# Patient Record
Sex: Female | Born: 1995
Health system: Southern US, Community
[De-identification: ages and names within clinical notes are randomized; demographics above are authoritative.]

## PROBLEM LIST (undated history)

## (undated) ENCOUNTER — Inpatient Hospital Stay (HOSPITAL_COMMUNITY): Payer: Self-pay

## (undated) ENCOUNTER — Emergency Department (HOSPITAL_COMMUNITY): Discharge status: Absent without leave | Payer: 59

## (undated) DIAGNOSIS — T783XXA Angioneurotic edema, initial encounter: Secondary | ICD-10-CM

## (undated) DIAGNOSIS — R87629 Unspecified abnormal cytological findings in specimens from vagina: Secondary | ICD-10-CM

## (undated) DIAGNOSIS — L309 Dermatitis, unspecified: Secondary | ICD-10-CM

## (undated) DIAGNOSIS — A749 Chlamydial infection, unspecified: Secondary | ICD-10-CM

## (undated) DIAGNOSIS — F32A Depression, unspecified: Secondary | ICD-10-CM

## (undated) DIAGNOSIS — R569 Unspecified convulsions: Secondary | ICD-10-CM

## (undated) DIAGNOSIS — F329 Major depressive disorder, single episode, unspecified: Secondary | ICD-10-CM

## (undated) DIAGNOSIS — D649 Anemia, unspecified: Secondary | ICD-10-CM

## (undated) HISTORY — PX: NO PAST SURGERIES: SHX2092

## (undated) HISTORY — DX: Anemia, unspecified: D64.9

## (undated) HISTORY — DX: Angioneurotic edema, initial encounter: T78.3XXA

## (undated) HISTORY — DX: Dermatitis, unspecified: L30.9

---
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
## 2014-01-25 NOTE — L&D Delivery Note (Cosign Needed)
Patient is 19 y.o. G1P1001 [redacted]w[redacted]d admitted for IOL for nonreassuring fetal strip  Delivery Note At 11:17 PM a viable female was delivered via Vaginal, Spontaneous Delivery (Presentation: ; Occiput Anterior).  APGAR: 8, 9; weight pending.   Placenta status: Intact, Spontaneous.  Cord: 3 vessels with the following complications: None.    Anesthesia: Epidural  Episiotomy: None Lacerations: Labial, hemostatic Suture Repair: none Est. Blood Loss (mL): 53  Mom to postpartum.  Baby to Couplet care / Skin to Skin.  Durenda Hurt 10/09/2014, 12:06 AM   I was present for the entire delivery of baby and placenta and inspection of perineum and agree with above. Moderate MSF noted at delivery, but not observed before.   Placenta to: BS Feeding: Breast Circ: OP Contraception: POPs  [place], CNM 10/09/2014 12:30 AM

## 2014-04-29 ENCOUNTER — Other Ambulatory Visit (HOSPITAL_COMMUNITY): Payer: Self-pay

## 2014-04-29 ENCOUNTER — Other Ambulatory Visit (HOSPITAL_COMMUNITY): Payer: Self-pay | Admitting: Urology

## 2014-04-29 DIAGNOSIS — Z1389 Encounter for screening for other disorder: Secondary | ICD-10-CM

## 2014-04-29 LAB — OB RESULTS CONSOLE RPR: RPR: NONREACTIVE

## 2014-04-29 LAB — OB RESULTS CONSOLE HEPATITIS B SURFACE ANTIGEN: Hepatitis B Surface Ag: NEGATIVE

## 2014-04-29 LAB — OB RESULTS CONSOLE ABO/RH: RH TYPE: POSITIVE

## 2014-04-29 LAB — OB RESULTS CONSOLE GC/CHLAMYDIA
CHLAMYDIA, DNA PROBE: NEGATIVE
GC PROBE AMP, GENITAL: NEGATIVE

## 2014-04-29 LAB — OB RESULTS CONSOLE HIV ANTIBODY (ROUTINE TESTING): HIV: NONREACTIVE

## 2014-04-29 LAB — OB RESULTS CONSOLE RUBELLA ANTIBODY, IGM: Rubella: NON-IMMUNE/NOT IMMUNE

## 2014-05-21 ENCOUNTER — Ambulatory Visit (HOSPITAL_COMMUNITY)
Admission: RE | Admit: 2014-05-21 | Discharge: 2014-05-21 | Disposition: A | Discharge status: Home / Self Care | Payer: Medicaid Other | Source: Ambulatory Visit | Attending: Urology | Admitting: Urology

## 2014-05-21 ENCOUNTER — Encounter (HOSPITAL_COMMUNITY): Payer: Self-pay

## 2014-05-21 ENCOUNTER — Other Ambulatory Visit (HOSPITAL_COMMUNITY): Payer: Self-pay | Admitting: Urology

## 2014-05-21 DIAGNOSIS — Z36 Encounter for antenatal screening of mother: Secondary | ICD-10-CM | POA: Insufficient documentation

## 2014-05-21 DIAGNOSIS — Z1389 Encounter for screening for other disorder: Secondary | ICD-10-CM

## 2014-05-23 ENCOUNTER — Ambulatory Visit (HOSPITAL_COMMUNITY): Payer: Self-pay

## 2014-05-23 DIAGNOSIS — Z3A18 18 weeks gestation of pregnancy: Secondary | ICD-10-CM | POA: Insufficient documentation

## 2014-05-23 DIAGNOSIS — Z1389 Encounter for screening for other disorder: Secondary | ICD-10-CM | POA: Insufficient documentation

## 2014-05-23 DIAGNOSIS — O281 Abnormal biochemical finding on antenatal screening of mother: Secondary | ICD-10-CM | POA: Insufficient documentation

## 2014-05-27 ENCOUNTER — Other Ambulatory Visit (HOSPITAL_COMMUNITY): Payer: Self-pay

## 2014-05-28 ENCOUNTER — Ambulatory Visit (HOSPITAL_COMMUNITY): Payer: Self-pay

## 2014-07-19 ENCOUNTER — Encounter (HOSPITAL_COMMUNITY): Payer: Self-pay | Admitting: *Deleted

## 2014-07-19 ENCOUNTER — Inpatient Hospital Stay (HOSPITAL_COMMUNITY)
Admission: AD | Admit: 2014-07-19 | Discharge: 2014-07-19 | Disposition: A | Discharge status: Home / Self Care | Payer: 59 | Source: Ambulatory Visit | Attending: Obstetrics and Gynecology | Admitting: Obstetrics and Gynecology

## 2014-07-19 DIAGNOSIS — M545 Low back pain: Secondary | ICD-10-CM | POA: Diagnosis not present

## 2014-07-19 DIAGNOSIS — O26899 Other specified pregnancy related conditions, unspecified trimester: Secondary | ICD-10-CM

## 2014-07-19 DIAGNOSIS — R109 Unspecified abdominal pain: Secondary | ICD-10-CM | POA: Insufficient documentation

## 2014-07-19 DIAGNOSIS — O9989 Other specified diseases and conditions complicating pregnancy, childbirth and the puerperium: Secondary | ICD-10-CM | POA: Insufficient documentation

## 2014-07-19 DIAGNOSIS — Z3A26 26 weeks gestation of pregnancy: Secondary | ICD-10-CM | POA: Diagnosis not present

## 2014-07-19 HISTORY — DX: Unspecified abnormal cytological findings in specimens from vagina: R87.629

## 2014-07-19 LAB — URINALYSIS, ROUTINE W REFLEX MICROSCOPIC
BILIRUBIN URINE: NEGATIVE
Glucose, UA: NEGATIVE mg/dL
HGB URINE DIPSTICK: NEGATIVE
KETONES UR: NEGATIVE mg/dL
Leukocytes, UA: NEGATIVE
Nitrite: NEGATIVE
Protein, ur: NEGATIVE mg/dL
Specific Gravity, Urine: 1.01 (ref 1.005–1.030)
UROBILINOGEN UA: 1 mg/dL (ref 0.0–1.0)
pH: 6.5 (ref 5.0–8.0)
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
## 2014-07-19 NOTE — MAU Note (Signed)
Pt tearful, C/O lower abd & lower back pain & cramping x 2 days, is much worse today.  Had some spotting yesterday, none today.  Has noticed blood in her stool.

## 2014-07-19 NOTE — Discharge Instructions (Signed)
Back Pain in Pregnancy °Back pain during pregnancy is common. It happens in about half of all pregnancies. It is important for you and your baby that you remain active during your pregnancy. If you feel that back pain is not allowing you to remain active or sleep well, it is time to see your caregiver. Back pain may be caused by several factors related to changes during your pregnancy. Fortunately, unless you had trouble with your back before your pregnancy, the pain is likely to get better after you deliver. °Low back pain usually occurs between the fifth and seventh months of pregnancy. It can, however, happen in the first couple months. Factors that increase the risk of back problems include:  °· Previous back problems. °· Injury to your back. °· Having twins or multiple births. °· A chronic cough. °· Stress. °· Job-related repetitive motions. °· Muscle or spinal disease in the back. °· Family history of back problems, ruptured (herniated) discs, or osteoporosis. °· Depression, anxiety, and panic attacks. °CAUSES  °· When you are pregnant, your body produces a hormone called relaxin. This hormone makes the ligaments connecting the low back and pubic bones more flexible. This flexibility allows the baby to be delivered more easily. When your ligaments are loose, your muscles need to work harder to support your back. Soreness in your back can come from tired muscles. Soreness can also come from back tissues that are irritated since they are receiving less support. °· As the baby grows, it puts pressure on the nerves and blood vessels in your pelvis. This can cause back pain. °· As the baby grows and gets heavier during pregnancy, the uterus pushes the stomach muscles forward and changes your center of gravity. This makes your back muscles work harder to maintain good posture. °SYMPTOMS  °Lumbar pain during pregnancy °Lumbar pain during pregnancy usually occurs at or above the waist in the center of the back. There  may be pain and numbness that radiates into your leg or foot. This is similar to low back pain experienced by non-pregnant women. It usually increases with sitting for long periods of time, standing, or repetitive lifting. Tenderness may also be present in the muscles along your upper back. °Posterior pelvic pain during pregnancy °Pain in the back of the pelvis is more common than lumbar pain in pregnancy. It is a deep pain felt in your side at the waistline, or across the tailbone (sacrum), or in both places. You may have pain on one or both sides. This pain can also go into the buttocks and backs of the upper thighs. Pubic and groin pain may also be present. The pain does not quickly resolve with rest, and morning stiffness may also be present. °Pelvic pain during pregnancy can be brought on by most activities. A high level of fitness before and during pregnancy may or may not prevent this problem. Labor pain is usually 1 to 2 minutes apart, lasts for about 1 minute, and involves a bearing down feeling or pressure in your pelvis. However, if you are at term with the pregnancy, constant low back pain can be the beginning of early labor, and you should be aware of this. °DIAGNOSIS  °X-rays of the back should not be done during the first 12 to 14 weeks of the pregnancy and only when absolutely necessary during the rest of the pregnancy. MRIs do not give off radiation and are safe during pregnancy. MRIs also should only be done when absolutely necessary. °HOME CARE INSTRUCTIONS °· Exercise   as directed by your caregiver. Exercise is the most effective way to prevent or manage back pain. If you have a back problem, it is especially important to avoid sports that require sudden body movements. Swimming and walking are great activities. °· Do not stand in one place for long periods of time. °· Do not wear high heels. °· Sit in chairs with good posture. Use a pillow on your lower back if necessary. Make sure your head  rests over your shoulders and is not hanging forward. °· Try sleeping on your side, preferably the left side, with a pillow or two between your legs. If you are sore after a night's rest, your bed may be too soft. Try placing a board between your mattress and box spring. °· Listen to your body when lifting. If you are experiencing pain, ask for help or try bending your knees more so you can use your leg muscles rather than your back muscles. Squat down when picking up something from the floor. Do not bend over. °· Eat a healthy diet. Try to gain weight within your caregiver's recommendations. °· Use heat or cold packs 3 to 4 times a day for 15 minutes to help with the pain. °· Only take over-the-counter or prescription medicines for pain, discomfort, or fever as directed by your caregiver. °Sudden (acute) back pain °· Use bed rest for only the most extreme, acute episodes of back pain. Prolonged bed rest over 48 hours will aggravate your condition. °· Ice is very effective for acute conditions. °· Put ice in a plastic bag. °· Place a towel between your skin and the bag. °· Leave the ice on for 10 to 20 minutes every 2 hours, or as needed. °· Using heat packs for 30 minutes prior to activities is also helpful. °Continued back pain °See your caregiver if you have continued problems. Your caregiver can help or refer you for appropriate physical therapy. With conditioning, most back problems can be avoided. Sometimes, a more serious issue may be the cause of back pain. You should be seen right away if new problems seem to be developing. Your caregiver may recommend: °· A maternity girdle. °· An elastic sling. °· A back brace. °· A massage therapist or acupuncture. °SEEK MEDICAL CARE IF:  °· You are not able to do most of your daily activities, even when taking the pain medicine you were given. °· You need a referral to a physical therapist or chiropractor. °· You want to try acupuncture. °SEEK IMMEDIATE MEDICAL CARE  IF: °· You develop numbness, tingling, weakness, or problems with the use of your arms or legs. °· You develop severe back pain that is no longer relieved with medicines. °· You have a sudden change in bowel or bladder control. °· You have increasing pain in other areas of the body. °· You develop shortness of breath, dizziness, or fainting. °· You develop nausea, vomiting, or sweating. °· You have back pain which is similar to labor pains. °· You have back pain along with your water breaking or vaginal bleeding. °· You have back pain or numbness that travels down your leg. °· Your back pain developed after you fell. °· You develop pain on one side of your back. You may have a kidney stone. °· You see blood in your urine. You may have a bladder infection or kidney stone. °· You have back pain with blisters. You may have shingles. °Back pain is fairly common during pregnancy but should not be accepted as just part of   the process. Back pain should always be treated as soon as possible. This will make your pregnancy as pleasant as possible. °Document Released: 04/21/2005 Document Revised: 04/05/2011 Document Reviewed: 06/02/2010 °ExitCare® Patient Information ©2015 [employer]. This information is not intended to replace advice given to you by your health care provider. Make sure you discuss any questions you have with your health care provider. ° ° ° °Preterm Labor Information °Preterm labor is when labor starts before you are [redacted] weeks pregnant. The normal length of pregnancy is 39 to 41 weeks.  °CAUSES  °The cause of preterm labor is not often known. The most common known cause is infection. °RISK FACTORS °· Having a history of preterm labor. °· Having your water break before it should. °· Having a placenta that covers the opening of the cervix. °· Having a placenta that breaks away from the uterus. °· Having a cervix that is too weak to hold the baby in the uterus. °· Having too much fluid in the amniotic  sac. °· Taking drugs or smoking while pregnant. °· Not gaining enough weight while pregnant. °· Being younger than 18 and older than 21 years old. °· Having a low income. °· Being African American. °SYMPTOMS °· Period-like cramps, belly (abdominal) pain, or back pain. °· Contractions that are regular, as often as six in an hour. They may be mild or painful. °· Contractions that start at the top of the belly. They then move to the lower belly and back. °· Lower belly pressure that seems to get stronger. °· Bleeding from the vagina. °· Fluid leaking from the vagina. °TREATMENT  °Treatment depends on: °· Your condition. °· The condition of your baby. °· How many weeks pregnant you are. °Your doctor may have you: °· Take medicine to stop contractions. °· Stay in bed except to use the restroom (bed rest). °· Stay in the hospital. °WHAT SHOULD YOU DO IF YOU THINK YOU ARE IN PRETERM LABOR? °Call your doctor right away. You need to go to the hospital right away.  °HOW CAN YOU PREVENT PRETERM LABOR IN FUTURE PREGNANCIES? °· Stop smoking, if you smoke. °· Maintain healthy weight gain. °· Do not take drugs or be around chemicals that are not needed. °· Tell your doctor if you think you have an infection. °· Tell your doctor if you had a preterm labor before. °Document Released: 04/09/2008 Document Revised: 11/01/2012 Document Reviewed: 04/09/2008 °ExitCare® Patient Information ©2015 [employer]. This information is not intended to replace advice given to you by your health care provider. Make sure you discuss any questions you have with your health care provider. ° °
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
## 2014-09-23 LAB — OB RESULTS CONSOLE GBS: STREP GROUP B AG: NEGATIVE

## 2014-10-08 ENCOUNTER — Inpatient Hospital Stay (HOSPITAL_COMMUNITY): Payer: Medicaid Other | Admitting: Anesthesiology

## 2014-10-08 ENCOUNTER — Inpatient Hospital Stay (HOSPITAL_COMMUNITY)
Admission: AD | Admit: 2014-10-08 | Discharge: 2014-10-10 | DRG: 775 | Disposition: A | Discharge status: Home / Self Care | Payer: Medicaid Other | Source: Ambulatory Visit | Attending: Obstetrics & Gynecology | Admitting: Obstetrics & Gynecology

## 2014-10-08 ENCOUNTER — Encounter (HOSPITAL_COMMUNITY): Payer: Self-pay

## 2014-10-08 DIAGNOSIS — Z3A38 38 weeks gestation of pregnancy: Secondary | ICD-10-CM | POA: Diagnosis present

## 2014-10-08 DIAGNOSIS — Z9889 Other specified postprocedural states: Secondary | ICD-10-CM

## 2014-10-08 DIAGNOSIS — Z3490 Encounter for supervision of normal pregnancy, unspecified, unspecified trimester: Secondary | ICD-10-CM

## 2014-10-08 DIAGNOSIS — Z8249 Family history of ischemic heart disease and other diseases of the circulatory system: Secondary | ICD-10-CM

## 2014-10-08 LAB — CBC
HEMATOCRIT: 35.2 % — AB (ref 36.0–46.0)
HEMOGLOBIN: 12.5 g/dL (ref 12.0–15.0)
MCH: 32.3 pg (ref 26.0–34.0)
MCHC: 35.5 g/dL (ref 30.0–36.0)
MCV: 91 fL (ref 78.0–100.0)
Platelets: 190 10*3/uL (ref 150–400)
RBC: 3.87 MIL/uL (ref 3.87–5.11)
RDW: 13.5 % (ref 11.5–15.5)
WBC: 9.7 10*3/uL (ref 4.0–10.5)

## 2014-10-08 LAB — TYPE AND SCREEN
ABO/RH(D): O POS
ANTIBODY SCREEN: NEGATIVE

## 2014-10-08 LAB — ABO/RH: ABO/RH(D): O POS

## 2014-10-08 MED ORDER — LACTATED RINGERS IV SOLN
INTRAVENOUS | Status: DC
  Administered 2014-10-08 (×2): via INTRAVENOUS

## 2014-10-08 MED ORDER — OXYTOCIN BOLUS FROM INFUSION
500.0000 mL | INTRAVENOUS | Status: DC
Start: 2014-10-08 — End: 2014-10-09

## 2014-10-08 MED ORDER — FENTANYL 2.5 MCG/ML BUPIVACAINE 1/10 % EPIDURAL INFUSION (WH - ANES)
14.0000 mL/h | INTRAMUSCULAR | Status: DC | PRN
  Administered 2014-10-08 (×2): 14 mL/h via EPIDURAL
  Filled 2014-10-08: qty 125

## 2014-10-08 MED ORDER — DIPHENHYDRAMINE HCL 50 MG/ML IJ SOLN
12.5000 mg | INTRAMUSCULAR | Status: DC | PRN
Start: 2014-10-08 — End: 2014-10-09

## 2014-10-08 MED ORDER — FENTANYL CITRATE (PF) 100 MCG/2ML IJ SOLN
100.0000 ug | INTRAMUSCULAR | Status: DC | PRN
  Administered 2014-10-08: 100 ug via INTRAVENOUS
  Filled 2014-10-08: qty 2

## 2014-10-08 MED ORDER — LACTATED RINGERS IV BOLUS (SEPSIS): 1000.0000 mL | Freq: Once | INTRAVENOUS | Status: DC

## 2014-10-08 MED ORDER — CITRIC ACID-SODIUM CITRATE 334-500 MG/5ML PO SOLN
30.0000 mL | ORAL | Status: DC | PRN
Start: 2014-10-08 — End: 2014-10-09

## 2014-10-08 MED ORDER — ACETAMINOPHEN 325 MG PO TABS: 650.0000 mg | ORAL_TABLET | ORAL | Status: DC | PRN

## 2014-10-08 MED ORDER — LIDOCAINE HCL (PF) 1 % IJ SOLN
30.0000 mL | INTRAMUSCULAR | Status: DC | PRN
  Filled 2014-10-08: qty 30

## 2014-10-08 MED ORDER — EPHEDRINE 5 MG/ML INJ
10.0000 mg | INTRAVENOUS | Status: DC | PRN
  Filled 2014-10-08: qty 2

## 2014-10-08 MED ORDER — ONDANSETRON HCL 4 MG/2ML IJ SOLN
4.0000 mg | Freq: Four times a day (QID) | INTRAMUSCULAR | Status: DC | PRN
  Administered 2014-10-08: 4 mg via INTRAVENOUS
  Filled 2014-10-08: qty 2

## 2014-10-08 MED ORDER — PHENYLEPHRINE 40 MCG/ML (10ML) SYRINGE FOR IV PUSH (FOR BLOOD PRESSURE SUPPORT)
80.0000 ug | PREFILLED_SYRINGE | INTRAVENOUS | Status: DC | PRN
  Filled 2014-10-08: qty 20
  Filled 2014-10-08: qty 2

## 2014-10-08 MED ORDER — OXYTOCIN 40 UNITS IN LACTATED RINGERS INFUSION - SIMPLE MED
1.0000 m[IU]/min | INTRAVENOUS | Status: DC
  Administered 2014-10-08: 4 m[IU]/min via INTRAVENOUS
  Administered 2014-10-08: 2 m[IU]/min via INTRAVENOUS
  Administered 2014-10-08: 10 m[IU]/min via INTRAVENOUS
  Filled 2014-10-08: qty 1000

## 2014-10-08 MED ORDER — LACTATED RINGERS IV SOLN
500.0000 mL | INTRAVENOUS | Status: DC | PRN
  Administered 2014-10-08: 500 mL via INTRAVENOUS

## 2014-10-08 MED ORDER — OXYTOCIN 40 UNITS IN LACTATED RINGERS INFUSION - SIMPLE MED: 62.5000 mL/h | INTRAVENOUS | Status: DC

## 2014-10-08 MED ORDER — OXYCODONE-ACETAMINOPHEN 5-325 MG PO TABS: 2.0000 | ORAL_TABLET | ORAL | Status: DC | PRN

## 2014-10-08 MED ORDER — OXYCODONE-ACETAMINOPHEN 5-325 MG PO TABS
1.0000 | ORAL_TABLET | ORAL | Status: DC | PRN
Start: 2014-10-08 — End: 2014-10-09

## 2014-10-08 MED ORDER — LIDOCAINE HCL (PF) 1 % IJ SOLN
INTRAMUSCULAR | Status: DC | PRN
  Administered 2014-10-08 (×2): 8 mL via EPIDURAL

## 2014-10-08 MED ORDER — TERBUTALINE SULFATE 1 MG/ML IJ SOLN
0.2500 mg | Freq: Once | INTRAMUSCULAR | Status: DC | PRN
  Filled 2014-10-08: qty 1

## 2014-10-08 NOTE — Anesthesia Preprocedure Evaluation (Signed)
Anesthesia Evaluation  Patient identified by MRN, date of birth, ID band Patient awake    Reviewed: Allergy & Precautions, H&P , NPO status , Patient's Chart, lab work & pertinent test results  Airway Mallampati: I  TM Distance: >3 FB Neck ROM: full    Dental no notable dental hx.    Pulmonary neg pulmonary ROS,    Pulmonary exam normal breath sounds clear to auscultation       Cardiovascular negative cardio ROS Normal cardiovascular exam     Neuro/Psych negative neurological ROS  negative psych ROS   GI/Hepatic negative GI ROS, Neg liver ROS,   Endo/Other  negative endocrine ROS  Renal/GU negative Renal ROS     Musculoskeletal   Abdominal Normal abdominal exam  (+)   Peds  Hematology negative hematology ROS (+)   Anesthesia Other Findings   Reproductive/Obstetrics (+) Pregnancy                             Anesthesia Physical Anesthesia Plan  ASA: II  Anesthesia Plan: Epidural   Post-op Pain Management:    Induction:   Airway Management Planned:   Additional Equipment:   Intra-op Plan:   Post-operative Plan:   Informed Consent: I have reviewed the patients History and Physical, chart, labs and discussed the procedure including the risks, benefits and alternatives for the proposed anesthesia with the patient or authorized representative who has indicated his/her understanding and acceptance.     Plan Discussed with:   Anesthesia Plan Comments:         Anesthesia Quick Evaluation  

## 2014-10-08 NOTE — H&P (Signed)
LABOR ADMISSION HISTORY AND PHYSICAL  Angelicia Lessner is a 19 y.o. female G1P0 with IUP at [redacted]w[redacted]d by LMP presenting for IOL for non-reassuring fetal strip.  She reports sharp back and abdominal pains beginning around 10 hours ago. Denies contractions, LOF, VB.  She plans on breast and bottle feeding. She request POPs for birth control.  Dating: By LMP (01/13/14) --->  Estimated Date of Delivery: 10/20/14  Sono:    , CWD, normal anatomy, cephalic presentation, longitudinal lie, 269g, 59% EFW   Past Medical History: Past Medical History  Diagnosis Date  . Vaginal Pap smear, abnormal     Past Surgical History: History reviewed. No pertinent past surgical history.  Obstetrical History: OB History    Gravida Para Term Preterm AB TAB SAB Ectopic Multiple Living   1         0      Social History: Social History   Social History  . Marital Status: Single    Spouse Name: N/A  . Number of Children: N/A  . Years of Education: N/A   Social History Main Topics  . Smoking status: Never Smoker   . Smokeless tobacco: Never Used  . Alcohol Use: No  . Drug Use: Yes    Special: Marijuana     Comment: marijuana- stopped x 10 dys  . Sexual Activity: Yes   Other Topics Concern  . None   Social History Narrative    Family History: Family History  Problem Relation Age of Onset  . Hypertension Mother     Allergies: No Known Allergies  Prescriptions prior to admission  Medication Sig Dispense Refill Last Dose  . Prenatal Vit-Fe Fumarate-FA (PRENATAL MULTIVITAMIN) TABS tablet Take 1 tablet by mouth daily at 12 noon.   10/07/2014 at Unknown time     Review of Systems   All systems reviewed and negative except as stated in HPI  BP 135/86 mmHg  Pulse 88  Temp(Src) 98.6 F (37 C) (Axillary)  Resp 18  Ht  (1.702 m)  Wt 166 lb (75.297 kg)  BMI 25.99 kg/m2  SpO2 98%  LMP 01/13/2014 General appearance: alert, cooperative and no distress Lungs: non-labored  breathing   Cardiac: regular rate Abdomen: gravid, non-tender Extremities: no sign of DVT, edema Presentation: cephalic Fetal monitoringBaseline: 145 bpm, Variability: Good {> 6 bpm), Accelerations: Reactive and Decelerations: Early Uterine activityFrequency: Every 1-4 minutes, Duration: 30-60 seconds and Intensity: moderate Dilation: 2 Effacement (%): 50 Station: -3 Exam by:: Sarajane Marek, RNC   Prenatal labs: ABO, Rh:  O+ Antibody:  Neg Rubella:  Non-immune RPR:   Neg HBsAg:   Neg HIV:   Non-reactive GBS: Negative (08/29 0000)  1 hr Glucola 97 Genetic screening: borderline elevated MSAFP (2.29) Anatomy US: no abnormalities noted  Prenatal Transfer Tool  Maternal Diabetes: No Genetic Screening: Abnormal:  Results: Elevated AFP Maternal Ultrasounds/Referrals: Normal Fetal Ultrasounds or other Referrals:  None Maternal Substance Abuse:  No Significant Maternal Medications:  None Significant Maternal Lab Results: Lab values include: Group B Strep negative  No results found for this or any previous visit (from the past 24 hour(s)).  Patient Active Problem List   Diagnosis Date Noted  . [redacted] weeks gestation of pregnancy   . Encounter for routine screening for malformation using ultrasonics   . Abnormal biochemical finding on antenatal screening of mother     Assessment: Lisbeth Puller is a 19 y.o. G1P0 at [redacted]w[redacted]d here for IOL for non-reassuring fetal strip.   #Labor:induction of labor;  will place Foley bulb and use pitocin #Pain: epidural #FWB: Category 2 #ID:  GBS negative #MOF: breast and bottle #MOC:POPs (will consider Mirena) #Circ:  outpatient  Tarri Abernethy, MD PGY-1 [facility]   OB fellow attestation: I have seen and examined this patient; I agree with above documentation in the resident's note.  Nicloe Frontera is a 19 y.o. G1P0 here for IOL 2nd to non-reassuring fetal tracing the decelerations in the MAU at term.  PE: BP 132/84 mmHg   Pulse 87  Temp(Src) 98.6 F (37 C) (Axillary)  Resp 18  Ht 5\' 7"  (1.702 m)  Wt 166 lb (75.297 kg)  BMI 25.99 kg/m2  SpO2 98%  LMP 01/13/2014 Gen: calm comfortable, NAD Resp: normal effort, no distress Abd: gravid  ROS, labs, PMH reviewed  Plan: #Labor:induction of labor; will place Foley bulb and use pitocin #Pain: epidural #FWB: Category 2 #ID:  GBS negative #MOF: breast and bottle #MOC:POPs (will consider Mirena) #Circ:  Outpatient  Federico Flake, MD Family Medicine, OB Fellow 10/08/2014, 11:57 AM

## 2014-10-08 NOTE — MAU Note (Signed)
Pt c/o tightness in stomach that comes and goes snce 1100pm. Denies LOF or vag bleeding. +FM

## 2014-10-08 NOTE — Anesthesia Procedure Notes (Signed)
Epidural Patient location during procedure: OB Start time: 10/08/2014 8:15 PM End time: 10/08/2014 8:19 PM  Staffing Anesthesiologist: Leilani Able Performed by: anesthesiologist   Preanesthetic Checklist Completed: patient identified, surgical consent, pre-op evaluation, timeout performed, IV checked, risks and benefits discussed and monitors and equipment checked  Epidural Patient position: sitting Prep: site prepped and draped and DuraPrep Patient monitoring: continuous pulse ox and blood pressure Approach: midline Location: L3-L4 Injection technique: LOR air  Needle:  Needle type: Tuohy  Needle gauge: 17 G Needle length: 9 cm and 9 Needle insertion depth: 5 cm cm Catheter type: closed end flexible Catheter size: 19 Gauge Catheter at skin depth: 10 cm Test dose: negative and Other  Assessment Sensory level: T9 Events: blood not aspirated, injection not painful, no injection resistance, negative IV test and no paresthesia  Additional Notes Reason for block:procedure for pain

## 2014-10-08 NOTE — Progress Notes (Signed)
Crystal Walters is a 19 y.o. G1P0 at [redacted]w[redacted]d by LMP admitted for induction of labor due to non-reassuring fetal monitoring (deceleration).  Subjective: Contractions have stopped, much more comfortable  Objective: BP 131/83 mmHg  Pulse 85  Temp(Src) 98.6 F (37 C) (Axillary)  Resp 18  Ht  (1.702 m)  Wt 166 lb (75.297 kg)  BMI 25.99 kg/m2  SpO2 98%  LMP 01/13/2014      FHT:  FHR: 145 bpm, variability: moderate,  accelerations:  Present,  decelerations:  Absent UC:   none SVE:   Dilation: 3 Effacement (%): 70 Station: -2 Exam by:: Dr Alvester Morin  Labs: Lab Results  Component Value Date   WBC 9.7 10/08/2014   HGB 12.5 10/08/2014   HCT 35.2* 10/08/2014   MCV 91.0 10/08/2014   PLT 190 10/08/2014    Assessment / Plan: Induction of labor due to non-reassuring fetal testing,  progressing well on pitocin  Labor: will start pitocin now that bishop is 7 Fetal Wellbeing:  Category I Pain Control:  Fentanyl I/D:  GBS neg Anticipated MOD:  NSVD  Crystal Walters 10/08/2014, 2:00 PM

## 2014-10-09 ENCOUNTER — Encounter (HOSPITAL_COMMUNITY): Payer: Self-pay

## 2014-10-09 DIAGNOSIS — Z3A38 38 weeks gestation of pregnancy: Secondary | ICD-10-CM

## 2014-10-09 DIAGNOSIS — Z8249 Family history of ischemic heart disease and other diseases of the circulatory system: Secondary | ICD-10-CM

## 2014-10-09 LAB — HIV ANTIBODY (ROUTINE TESTING W REFLEX): HIV SCREEN 4TH GENERATION: NONREACTIVE

## 2014-10-09 LAB — SYPHILIS: RPR W/REFLEX TO RPR TITER AND TREPONEMAL ANTIBODIES, TRADITIONAL SCREENING AND DIAGNOSIS ALGORITHM: RPR Ser Ql: NONREACTIVE

## 2014-10-09 MED ORDER — OXYCODONE-ACETAMINOPHEN 5-325 MG PO TABS: 1.0000 | ORAL_TABLET | ORAL | Status: DC | PRN

## 2014-10-09 MED ORDER — ONDANSETRON HCL 4 MG PO TABS: 4.0000 mg | ORAL_TABLET | ORAL | Status: DC | PRN

## 2014-10-09 MED ORDER — DIBUCAINE 1 % RE OINT: 1.0000 "application " | TOPICAL_OINTMENT | RECTAL | Status: DC | PRN

## 2014-10-09 MED ORDER — BENZOCAINE-MENTHOL 20-0.5 % EX AERO
1.0000 "application " | INHALATION_SPRAY | CUTANEOUS | Status: DC | PRN
  Administered 2014-10-09: 1 via TOPICAL
  Filled 2014-10-09: qty 56

## 2014-10-09 MED ORDER — DIPHENHYDRAMINE HCL 25 MG PO CAPS: 25.0000 mg | ORAL_CAPSULE | Freq: Four times a day (QID) | ORAL | Status: DC | PRN

## 2014-10-09 MED ORDER — PRENATAL MULTIVITAMIN CH
1.0000 | ORAL_TABLET | Freq: Every day | ORAL | Status: DC
  Administered 2014-10-09 – 2014-10-10 (×2): 1 via ORAL
  Filled 2014-10-09 (×2): qty 1

## 2014-10-09 MED ORDER — ZOLPIDEM TARTRATE 5 MG PO TABS: 5.0000 mg | ORAL_TABLET | Freq: Every evening | ORAL | Status: DC | PRN

## 2014-10-09 MED ORDER — SIMETHICONE 80 MG PO CHEW: 80.0000 mg | CHEWABLE_TABLET | ORAL | Status: DC | PRN

## 2014-10-09 MED ORDER — SENNOSIDES-DOCUSATE SODIUM 8.6-50 MG PO TABS
2.0000 | ORAL_TABLET | ORAL | Status: DC
  Administered 2014-10-09: 2 via ORAL
  Filled 2014-10-09: qty 2

## 2014-10-09 MED ORDER — ONDANSETRON HCL 4 MG/2ML IJ SOLN: 4.0000 mg | INTRAMUSCULAR | Status: DC | PRN

## 2014-10-09 MED ORDER — IBUPROFEN 600 MG PO TABS
600.0000 mg | ORAL_TABLET | Freq: Four times a day (QID) | ORAL | Status: DC
  Administered 2014-10-09 – 2014-10-10 (×6): 600 mg via ORAL
  Filled 2014-10-09 (×6): qty 1

## 2014-10-09 MED ORDER — WITCH HAZEL-GLYCERIN EX PADS: 1.0000 "application " | MEDICATED_PAD | CUTANEOUS | Status: DC | PRN

## 2014-10-09 MED ORDER — LANOLIN HYDROUS EX OINT: TOPICAL_OINTMENT | CUTANEOUS | Status: DC | PRN

## 2014-10-09 MED ORDER — ACETAMINOPHEN 325 MG PO TABS: 650.0000 mg | ORAL_TABLET | ORAL | Status: DC | PRN

## 2014-10-09 NOTE — Clinical Social Work Maternal (Signed)
CLINICAL SOCIAL WORK MATERNAL/CHILD NOTE  Patient Details  Name: Crystal Walters MRN: [id number] Date of Birth: 09/20/1995  Date:  10/09/2014  Clinical Social Worker Initiating Note:  Sarah Venning LCSW and Sophiea Ueda BSW, MSW intern   Date/ Time Initiated:  10/09/14/ @ 2:30 pm   Child's Name:  Bryon   Legal Guardian:  Crystal Walters (MOB) and Crystal Walters (FOB)   Need for Interpreter:  None   Date of Referral:  10/08/14     Reason for Referral:  Current Substance Use/Substance Use During Pregnancy    Referral Source:  Central Nursery   Address:  [address] [place],  [address]  Phone number:  [phone]   Household Members:  Significant Other   Natural Supports (not living in the home):  Extended Family, Immediate Family, Spouse/significant other, Parent   Professional Supports: None   Employment:   None Reported  Type of Work:   NA  Education:      Financial Resources:  Medicaid   Other Resources:  WIC Cultural/Religious Considerations Which May Impact Care:  None Reported   Strengths:  Ability to meet basic needs , Home prepared for child    Risk Factors/Current Problems:  None   Cognitive State:  Alert , Goal Oriented , Insightful , Linear Thinking    Mood/Affect:  Bright , Comfortable , Relaxed , Happy , Interested    CSW Assessment:  CSW and MSW intern went in to patients room due to a consult being placed because of MOB's THC usage 10 days prior to delivery.  FOB and aunt of MOB were present in the room.MOB consented for them to stay in the room during the assessment. MOB expressed being excited and happy in becoming a mother and adjusting well to all the new changes in her life. MOB stated the birthing process went well and her experience at the hospital has been great so far. MOB voiced being "overly prepared" for the infant to come home and having all of infant's basic needs met. Per MOB, she has a strong family support sytem and resides  with FOB. MOB also stated her mother lives about five minutes away from them and will be available to help out with infant care.   CSW provided education on perinatal mood disorders. There is no prior history of mental health in MOB's chart. Per MOB, the pregnancy went well and she did not acknowledge any concerns in regard to perinatal mood disorders. MOB stated she just constantly felt hot during her pregnancy and during the last few weeks was just stressed due to being uncomfortable. MOB was appreciative of the education provided by CSW. MOB asked about pediatrician information and if CSW could provide her with a list. CSW made a copy for MOB and left it with her.   CSW inquired about THC history listed in MOB's H&P admission notes. MOB denied any substance use prior to or during the pregnancy. MOB voiced her concerns in that note being inaccurate and asked questions about who to contact in the hospital in order to have that removed from her records.  MOB voiced being aware of the hospital's drug screening policies and being aware that infant's UDS came out negative. MOB stated she was not concerned about the results and did not object to infant being drug screened, she simply wants to get her records straightened out. CSW informed MOB she would inquire about the hospital's record policies and procedures in regard to having information removed from a   patient's chart. MOB thanked CSW for her help and denied having any further questions. MOB agreed to contact CSW if needs arise.   CSW  Plan/Description:  Patient/Family Education: Perinatal mood disorders, hospital drug screen policy CSW to monitor infant's drug screens, and will notify CPS if there is a positive drug screen  No Further Intervention Required/No Barriers to Discharge    Jarrah Babich, Student-SW 10/09/2014, 3:40 PM 

## 2014-10-09 NOTE — Progress Notes (Signed)
Post Partum Day 1 Subjective:  Crystal Walters is a 19 y.o. G1P1001 [redacted]w[redacted]d s/p IVD for decels, moderate meconium in amniotic fluid.  No acute events overnight.  Pt denies problems with ambulating, voiding or po intake.  She denies nausea or vomiting.  Pain is well controlled.  She has not had flatus. She has not had bowel movement.  Lochia Small.  Plan for birth control is oral progesterone-only contraceptive.  Method of Feeding: Breast  Objective: Blood pressure 130/85, pulse 81, temperature 98 F (36.7 C), temperature source Oral, resp. rate 18, height  (1.702 m), weight 166 lb (75.297 kg), last menstrual period 01/13/2014, SpO2 100 %, unknown if currently breastfeeding.  Physical Exam:  General: alert, cooperative and no distress Lochia:normal flow Chest: CTAB Heart: RRR no m/r/g Abdomen: +BS, soft, nontender Uterine Fundus: firm, below level of umbilicus, minimal uterine tenderness DVT Evaluation: No evidence of DVT seen on physical exam. Extremities: no edema   Recent Labs  10/08/14 1020  HGB 12.5  HCT 35.2*    Assessment/Plan:  ASSESSMENT: Crystal Walters is a 19 y.o. G1P1001 [redacted]w[redacted]d s/p IVD for late decels found to have moderate meconium in amniotic fluid.  Plan for discharge tomorrow, Breastfeeding, Lactation consult and Contraception with POP   LOS: 1 day   Durenda Hurt 10/09/2014, 7:30 AM

## 2014-10-09 NOTE — Lactation Note (Signed)
This note was copied from the chart of Crystal Averey Koning. Lactation Consultation Note Initial visit at 20 hours of age.  Mom reports a few good feedings.  Baby is in crib showing feeding cues.  Mom declines holding baby STS for this feeding.  Assisted with cross cradle hold and pillow support.  Baby latches well with wide mouth and flanged lips.  Strong rhythmic sucking for several minutes observed and baby continues at the breast. [facility] resources given and discussed.  Encouraged to feed with early cues on demand.  Early newborn behavior discussed.  Hand expression demonstrated with colostrum visible.  Mom to call for assist as needed.  .   Patient Name: Crystal Walters Today's Date: 10/09/2014 Reason for consult: Initial assessment   Maternal Data Has patient been taught Hand Expression?: Yes Does the patient have breastfeeding experience prior to this delivery?: No  Feeding Feeding Type: Breast Fed Length of feed:  (observed several minutes)  LATCH Score/Interventions Latch: Grasps breast easily, tongue down, lips flanged, rhythmical sucking. Intervention(s): Adjust position;Assist with latch;Breast massage;Breast compression  Audible Swallowing: A few with stimulation Intervention(s): Skin to skin;Hand expression;Alternate breast massage  Type of Nipple: Everted at rest and after stimulation  Comfort (Breast/Nipple): Soft / non-tender     Hold (Positioning): Assistance needed to correctly position infant at breast and maintain latch. Intervention(s): Breastfeeding basics reviewed;Support Pillows;Position options;Skin to skin  LATCH Score: 8  Lactation Tools Discussed/Used     Consult Status Consult Status: Follow-up Date: 10/10/14 Follow-up type: In-patient    Shoptaw, Arvella Merles 10/09/2014, 8:11 PM

## 2014-10-09 NOTE — Anesthesia Postprocedure Evaluation (Signed)
  Anesthesia Post-op Note  Patient: Crystal Walters  Procedure(s) Performed: * No procedures listed *  Patient Location: Mother/Baby  Anesthesia Type:Epidural  Level of Consciousness: awake, alert , oriented and patient cooperative  Airway and Oxygen Therapy: Patient Spontanous Breathing  Post-op Pain: mild  Post-op Assessment: Post-op Vital signs reviewed, Patient's Cardiovascular Status Stable, Respiratory Function Stable, Patent Airway, No signs of Nausea or vomiting, Adequate PO intake, Pain level controlled, No headache, No backache and Patient able to bend at knees              Post-op Vital Signs: Reviewed and stable  Last Vitals:  Filed Vitals:   10/09/14 0634  BP: 130/85  Pulse: 81  Temp: 36.7 C  Resp: 18    Complications: No apparent anesthesia complications

## 2014-10-10 DIAGNOSIS — Z9889 Other specified postprocedural states: Secondary | ICD-10-CM

## 2014-10-10 MED ORDER — IBUPROFEN 600 MG PO TABS: 600.0000 mg | ORAL_TABLET | Freq: Four times a day (QID) | ORAL | Status: DC | PRN

## 2014-10-10 MED ORDER — NORETHINDRONE 0.35 MG PO TABS: 1.0000 | ORAL_TABLET | Freq: Every day | ORAL | Status: DC

## 2014-10-10 MED ORDER — MEASLES, MUMPS & RUBELLA VAC ~~LOC~~ INJ
0.5000 mL | INJECTION | Freq: Once | SUBCUTANEOUS | Status: AC
  Administered 2014-10-10: 0.5 mL via SUBCUTANEOUS
  Filled 2014-10-10: qty 0.5

## 2014-10-10 NOTE — Discharge Summary (Signed)
Obstetric Discharge Summary Reason for Admission: induction of labor for late decels in MAU Prenatal Procedures: ultrasound Intrapartum Procedures: spontaneous vaginal delivery Postpartum Procedures: none Complications-Operative and Postpartum: none  Delivery Note At 11:17 PM a viable female was delivered via Vaginal, Spontaneous Delivery (Presentation: ; Occiput Anterior).  APGAR: 8, 9; weight pending.   Placenta status: Intact, Spontaneous.  Cord: 3 vessels with the following complications: None.    Anesthesia: Epidural  Episiotomy: None Lacerations: Labial, hemostatic Suture Repair: none Est. Blood Loss (mL): 53  Mom to postpartum.  Baby to Couplet care / Skin to Skin.  Hospital Course:  Principal Problem:   Normal spontaneous vaginal delivery Active Problems:   Term pregnancy   Status post induction of labor   Crystal Walters is a 19 y.o. G1P1001 s/p SVD.  Patient was admitted for induction due to nonreassuring fetal heart tracing with repetitive lates. Did have moderate meconium seen only at time of delivery.  She has postpartum course that was uncomplicated including no problems with ambulating, PO intake, urination, pain, or bleeding. The pt feels ready to go home and  will be discharged with outpatient follow-up.   Today: No acute events overnight.  Pt denies problems with ambulating, voiding or po intake.  She denies nausea or vomiting.  Pain is well controlled.  She has had flatus. She has not had bowel movement.  Lochia Small.  Plan for birth control is  oral progesterone-only contraceptive.  Method of Feeding: Breast  Physical Exam:  General: alert, cooperative, appears stated age and no distress Lochia: appropriate Uterine Fundus: firm Incision: none DVT Evaluation: No evidence of DVT seen on physical exam. Negative Homan's sign.  H/H: Lab Results  Component Value Date/Time   HGB 12.5 10/08/2014 10:20 AM   HCT 35.2* 10/08/2014 10:20 AM    Discharge  Diagnoses: Term Pregnancy-delivered  Discharge Information: Date: 10/10/2014 Activity: pelvic rest Diet: routine  Medications: PNV and Ibuprofen Breast feeding:  Yes Condition: stable Instructions: refer to handout Discharge to: home      Medication List    TAKE these medications        ibuprofen 600 MG tablet  Commonly known as:  ADVIL,MOTRIN  Take 1 tablet (600 mg total) by mouth every 6 (six) hours as needed for moderate pain or cramping.     norethindrone 0.35 MG tablet  Commonly known as:  ORTHO MICRONOR  Take 1 tablet (0.35 mg total) by mouth daily. Start pill pack on 3rd Sunday after delivery.  Start taking on:  10/27/2014     prenatal multivitamin Tabs tablet  Take 1 tablet by mouth daily at 12 noon.           Follow-up Information    Follow up with [facility] In 6 weeks.   Specialty:  Obstetrics and Gynecology   Why:  postpartum visit   Contact information:   [address] [place] [address] [phone]      Durenda Hurt ,DO PGY2 Resident Physician 10/10/2014,9:49 AM  OB fellow attestation I have seen and examined this patient and agree with above documentation in the resident's note.   Crystal Walters is a 19 y.o. G1P1001 s/p NSVD.   Pain is well controlled.  Plan for birth control is oral contraceptives (estrogen/progesterone).  Method of Feeding: breast  PE:  BP 135/83 mmHg  Pulse 90  Temp(Src) 98.6 F (37 C) (Oral)  Resp 19  Ht 5\' 7"  (1.702 m)  Wt 166 lb (75.297 kg)  BMI 25.99 kg/m2  SpO2 99%  LMP 01/13/2014  Breastfeeding? Unknown Gen: well appearing, NAD Lung: normal WOB CV; reg rate Abd: NTND Fundus firm  Recent Labs  10/08/14 1020  HGB 12.5  HCT 35.2*   Plan: discharge today - postpartum care discussed - f/u clinic in 6 weeks for postpartum visit  Federico Flake, MD 9:59 AM

## 2014-10-10 NOTE — Progress Notes (Signed)
Mom, Dad and Baby found sleeping in bed together. Baby removed from on top of FOB chest and placed in crib.  Instructed parents on safe sleep.

## 2014-10-10 NOTE — Lactation Note (Signed)
This note was copied from the chart of Crystal Walters. Lactation Consultation Note  Patient Name: Crystal Walters [id number] Date: 10/10/2014 Reason for consult: Follow-up assessment  Baby is 1 hours old and has been Consistently feeding at the breast 10 -35 mins,  Baby already latched on the right breast in cross cradle position , LC added pillow support.  LC reviewed basics and assisted with re-latch on the left breast , noted a short anterior frenulum when  The baby opened wide . Didn't elect latch in cross cradle and LC had mom do cross cradle with breast  Compressions and per mom comfortable . Baby fed 15 mins, released and nipple appeared well rounded. LC instructed mom on the use hand pump and had mom use it #24 Flange a good fit. Sore nipple and engorgement prevention and tx reviewed , referring to the Baby and me booklet.  Also instructed mom on the use of shells for when the milk comes in to elongate the nipple areola complex.  Mother informed of post-discharge support and given phone number to the lactation department, including services  for phone call assistance; out-patient appointments; and breastfeeding support group. List of other breastfeeding resources  in the community given in the handout. Encouraged mother to call for problems or concerns related to breastfeeding.   Maternal Data Has patient been taught Hand Expression?: Yes  Feeding Feeding Type: Breast Fed Length of feed: 20 min (LC assisted , nipple well rounded when baby released )  LATCH Score/Interventions Latch: Grasps breast easily, tongue down, lips flanged, rhythmical sucking.  Audible Swallowing: Spontaneous and intermittent  Type of Nipple: Everted at rest and after stimulation  Comfort (Breast/Nipple): Soft / non-tender     Hold (Positioning): Assistance needed to correctly position infant at breast and maintain latch.  LATCH Score: 9  Lactation Tools Discussed/Used Tools:  Pump Breast pump type: Manual   Consult Status Consult Status: Complete Date: 10/10/14    Kathrin Greathouse 10/10/2014, 10:52 AM

## 2014-11-25 ENCOUNTER — Ambulatory Visit: Payer: 59 | Admitting: Obstetrics and Gynecology

## 2015-09-11 ENCOUNTER — Emergency Department (HOSPITAL_COMMUNITY)
Admission: EM | Admit: 2015-09-11 | Discharge: 2015-09-11 | Discharge status: Against Medical Advice | Payer: 59 | Attending: Emergency Medicine | Admitting: Emergency Medicine

## 2015-09-11 ENCOUNTER — Encounter (HOSPITAL_COMMUNITY): Payer: Self-pay

## 2015-09-11 DIAGNOSIS — R112 Nausea with vomiting, unspecified: Secondary | ICD-10-CM | POA: Insufficient documentation

## 2015-09-11 DIAGNOSIS — R102 Pelvic and perineal pain: Secondary | ICD-10-CM | POA: Insufficient documentation

## 2015-09-11 MED ORDER — ONDANSETRON HCL 4 MG/2ML IJ SOLN
4.0000 mg | Freq: Once | INTRAMUSCULAR | Status: AC
  Administered 2015-09-11: 4 mg via INTRAVENOUS
  Filled 2015-09-11: qty 2

## 2015-09-11 MED ORDER — SODIUM CHLORIDE 0.9 % IV BOLUS (SEPSIS)
1000.0000 mL | Freq: Once | INTRAVENOUS | Status: AC
  Administered 2015-09-11: 1000 mL via INTRAVENOUS

## 2015-09-11 NOTE — ED Notes (Signed)
Bed: WA10 Expected date:  Expected time:  Means of arrival:  Comments: EMS-N/V 

## 2015-09-11 NOTE — ED Provider Notes (Signed)
[facility]-EMERGENCY DEPT Provider Note   CSN: [id number] Arrival date & time: 09/11/15  1511     History   Chief Complaint Chief Complaint  Patient presents with  . Emesis  . Nausea    HPI Crystal Walters is a 20 y.o. female.  HPI Crystal Walters is a 20 y.o. female with no medical problems, presents to ED with complaint of nausea and vomiting. Pt states she went to health dept this morning "to get checked out." States she did have some abdominal cramping at that time, but states that feels like her normal period cramps, states she is about to start her period. Patient states she was asymptomatic for any sexual chest disease, denies any vaginal discharge, pain with intercourse, abdominal pain. She states that while there she was told that her cervix was inflamed. She was given medications and was given prescription for Flagyl. She states she took 2 out of 4 tablets right after leaving and states shortly after that began throwing up. She states vomiting did not improve after several minutes so she took him as to the hospital. Patient states that she "feel crazy." She reports persistent nausea and vomiting. Reports lower abdominal cramping. She states that they did not do urinalysis and pregnancy test at the health department. Denies any fever or chills. No changes in her bowels. States she has not had anything to eat today and feels like the medications made her sick because it was on empty stomach.   Past Medical History:  Diagnosis Date  . Vaginal Pap smear, abnormal     Patient Active Problem List   Diagnosis Date Noted  . Normal spontaneous vaginal delivery 10/10/2014  . Status post induction of labor 10/10/2014  . Term pregnancy 10/08/2014  . [redacted] weeks gestation of pregnancy   . Encounter for routine screening for malformation using ultrasonics   . Abnormal biochemical finding on antenatal screening of mother     History reviewed. No pertinent surgical history.  OB History    Gravida Para Term Preterm AB Living   1 1 1     1    SAB TAB Ectopic Multiple Live Births         0 1       Home Medications    Prior to Admission medications   Medication Sig Start Date End Date Taking? Authorizing Provider  metroNIDAZOLE (FLAGYL) 500 MG tablet Take 500 mg by mouth 2 (two) times daily.   Yes Historical Provider, MD  ibuprofen (ADVIL,MOTRIN) 600 MG tablet Take 1 tablet (600 mg total) by mouth every 6 (six) hours as needed for moderate pain or cramping. Patient not taking: Reported on 09/11/2015 10/10/14   Adam PhenixJames G Arnold, MD  norethindrone (ORTHO MICRONOR) 0.35 MG tablet Take 1 tablet (0.35 mg total) by mouth daily. Start pill pack on 3rd Sunday after delivery. Patient not taking: Reported on 09/11/2015 10/27/14   Adam PhenixJames G Arnold, MD  Prenatal Vit-Fe Fumarate-FA (PRENATAL MULTIVITAMIN) TABS tablet Take 1 tablet by mouth daily at 12 noon.    Historical Provider, MD    Family History Family History  Problem Relation Age of Onset  . Hypertension Mother     Social History Social History  Substance Use Topics  . Smoking status: Never Smoker  . Smokeless tobacco: Never Used  . Alcohol use No     Allergies   Review of patient's allergies indicates no known allergies.   Review of Systems Review of Systems  Constitutional: Negative for chills and fever.  Respiratory: Negative for cough, chest tightness and shortness of breath.   Cardiovascular: Negative for chest pain, palpitations and leg swelling.  Gastrointestinal: Positive for abdominal pain, nausea and vomiting. Negative for diarrhea.  Genitourinary: Positive for pelvic pain. Negative for dysuria, flank pain, vaginal bleeding, vaginal discharge and vaginal pain.  Musculoskeletal: Negative for arthralgias, myalgias, neck pain and neck stiffness.  Skin: Negative for rash.  Neurological: Negative for dizziness, weakness and headaches.  All other systems reviewed and are negative.    Physical Exam Updated Vital  Signs BP 127/67 (BP Location: Right Arm)   Pulse (!) 56   Temp 97.5 F (36.4 C) (Oral)   Resp 18   Ht 5\' 7"  (1.702 m)   Wt 54.4 kg   LMP 08/23/2015 (Approximate)   SpO2 100%   BMI 18.79 kg/m   Physical Exam  Constitutional: She appears well-developed and well-nourished. No distress.  HENT:  Head: Normocephalic.  Eyes: Conjunctivae are normal.  Neck: Neck supple.  Cardiovascular: Normal rate, regular rhythm and normal heart sounds.   Pulmonary/Chest: Effort normal and breath sounds normal. No respiratory distress. She has no wheezes. She has no rales.  Abdominal: Soft. Bowel sounds are normal. She exhibits no distension. There is tenderness. There is no rebound.  Suprapubic tenderness with no guarding or rebound tenderness  Musculoskeletal: She exhibits no edema.  Neurological: She is alert.  Skin: Skin is warm and dry.  Psychiatric: She has a normal mood and affect. Her behavior is normal.  Nursing note and vitals reviewed.    ED Treatments / Results  Labs (all labs ordered are listed, but only abnormal results are displayed) Labs Reviewed  URINALYSIS, ROUTINE W REFLEX MICROSCOPIC (NOT AT [facility])  POC URINE PREG, ED    EKG  EKG Interpretation None       Radiology No results found.  Procedures Procedures (including critical care time)  Medications Ordered in ED Medications  sodium chloride 0.9 % bolus 1,000 mL (not administered)  ondansetron (ZOFRAN) injection 4 mg (not administered)     Initial Impression / Assessment and Plan / ED Course  I have reviewed the triage vital signs and the nursing notes.  Pertinent labs & imaging results that were available during my care of the patient were reviewed by me and considered in my medical decision making (see chart for details).  Clinical Course  pt in ED after becoming nauseated after being trated for STI. She received shot of rocephin, zithromax and took flagyl. Denies alcohol. Will give zofran, check UA and  preg test. States they did not check it at the clinic.   Delay in obtaining urinalysis, patient unable to go. She was given fluid bolus, still refusing. Ins and outs ordered, however patient is refusing and out catheter. Explained to her that as well as waiting on. Patient attempted to use at that time but instead had a bowel movement into the bedpen. We will check blood hCG.  Patient again was not able to give urine sample. She apparently walked out AGAINST MEDICAL ADVICE. She did not have anymore episodes of emesis while in ED. Most of time sleeping with her head under the sheet. Family with her.  Final Clinical Impressions(s) / ED Diagnoses   Final diagnoses:  Non-intractable vomiting with nausea, vomiting of unspecified type    New Prescriptions New Prescriptions   No medications on file     Jaynie Crumbleatyana Kailen Name, PA-C 09/12/15 0120    Raeford RazorStephen Kohut, MD 09/14/15 [phone]

## 2015-09-11 NOTE — ED Notes (Signed)
PA at bedside.

## 2015-09-11 NOTE — ED Triage Notes (Signed)
Pt presents via EMS with c/o nausea and vomiting. Pt went to the health department today and was diagnosed with PID. Pt was supposed to take one flagyl today and took 2 instead. Pt then vomited several times after taking the pills.

## 2015-09-11 NOTE — Progress Notes (Signed)
Patient listed as having UHC and Medicaid insurance.  Discussed status of insurance with registration.  They report [facility], and Medicaid was unable to process.  Patient reports she has Medicaid without a pcp.  [facility] [place] [facility] with [place] providers [place].  EDCM also provided patient [place] contact [place] and [place].  No further EDCM needs at this time.

## 2015-09-11 NOTE — ED Notes (Signed)
Unsuccessful IV attempt x 2.  Charge Dance movement psychotherapistN and Primary RN made aware.

## 2015-09-11 NOTE — ED Notes (Signed)
Pt refuses in and out cath  

## 2015-09-11 NOTE — ED Notes (Signed)
After three more cups of water and sitting on the bedpan for 40 minutes, pt sts she is still unable to go and will leave AMA.

## 2015-09-11 NOTE — ED Notes (Signed)
Pt has been aware that we need urine sample for hours. Pt placed on bed pan before 7pm because she refused to walk to the bathroom. Pt had bowel movement in urine sample, unable to send sample for testing. PA made aware. PA ordered I stat HCG, this RN attempted to draw blood off IV. No blood return noted. Pt c/o pain at IV site and demanded IV be removed. No swelling or redness noted to site. IV removed per request. Pt informed we still need blood sample. Pt refusing to be stuck with a needle. Pt sts "Can't I just stay until I can pee again?" This RN informed her that per PA we needed a pregnancy test stat, whether it be blood or urine or that she needed to leave AMA. Pt requested bed pan again. Pt currently on bedpan attempting to give another sample.

## 2015-09-11 NOTE — ED Notes (Addendum)
Pt. Sending family member out to tell staff she needs to urinate. When staff goes in to help patient, patient states, " I didn't call for you. Go get me a blanket." Patient has said the same saying numerous of times. Pt is also refusing the In and Out Cath. Nurse aware of patient behavior.

## 2015-09-11 NOTE — ED Notes (Signed)
Pt's nausea decreased. Asked PA for food. PA gave pt a sandwich, crackers, and drink

## 2015-09-25 ENCOUNTER — Other Ambulatory Visit (HOSPITAL_COMMUNITY)
Admission: RE | Admit: 2015-09-25 | Discharge: 2015-09-25 | Disposition: A | Discharge status: Home / Self Care | Payer: BLUE CROSS/BLUE SHIELD | Source: Ambulatory Visit | Attending: Nurse Practitioner | Admitting: Nurse Practitioner

## 2015-09-25 ENCOUNTER — Other Ambulatory Visit: Payer: Self-pay | Admitting: Nurse Practitioner

## 2015-09-25 DIAGNOSIS — Z01411 Encounter for gynecological examination (general) (routine) with abnormal findings: Secondary | ICD-10-CM | POA: Insufficient documentation

## 2015-09-25 DIAGNOSIS — Z113 Encounter for screening for infections with a predominantly sexual mode of transmission: Secondary | ICD-10-CM | POA: Diagnosis present

## 2015-09-26 LAB — CYTOLOGY - PAP

## 2015-12-17 DIAGNOSIS — Z309 Encounter for contraceptive management, unspecified: Secondary | ICD-10-CM | POA: Diagnosis not present

## 2016-01-20 DIAGNOSIS — J069 Acute upper respiratory infection, unspecified: Secondary | ICD-10-CM | POA: Diagnosis not present

## 2016-01-28 DIAGNOSIS — R062 Wheezing: Secondary | ICD-10-CM | POA: Diagnosis not present

## 2016-01-28 DIAGNOSIS — R05 Cough: Secondary | ICD-10-CM | POA: Diagnosis not present

## 2016-01-28 DIAGNOSIS — L309 Dermatitis, unspecified: Secondary | ICD-10-CM | POA: Diagnosis not present

## 2016-03-24 DIAGNOSIS — Z3202 Encounter for pregnancy test, result negative: Secondary | ICD-10-CM | POA: Diagnosis not present

## 2016-03-30 DIAGNOSIS — Z3202 Encounter for pregnancy test, result negative: Secondary | ICD-10-CM | POA: Diagnosis not present

## 2016-03-30 DIAGNOSIS — R110 Nausea: Secondary | ICD-10-CM | POA: Diagnosis not present

## 2016-03-30 DIAGNOSIS — N898 Other specified noninflammatory disorders of vagina: Secondary | ICD-10-CM | POA: Diagnosis not present

## 2016-03-30 DIAGNOSIS — N926 Irregular menstruation, unspecified: Secondary | ICD-10-CM | POA: Diagnosis not present

## 2016-04-08 DIAGNOSIS — N898 Other specified noninflammatory disorders of vagina: Secondary | ICD-10-CM | POA: Diagnosis not present

## 2016-04-09 DIAGNOSIS — Z113 Encounter for screening for infections with a predominantly sexual mode of transmission: Secondary | ICD-10-CM | POA: Diagnosis not present

## 2016-04-20 DIAGNOSIS — A749 Chlamydial infection, unspecified: Secondary | ICD-10-CM | POA: Diagnosis not present

## 2016-04-20 DIAGNOSIS — Z113 Encounter for screening for infections with a predominantly sexual mode of transmission: Secondary | ICD-10-CM | POA: Diagnosis not present

## 2016-05-12 DIAGNOSIS — J029 Acute pharyngitis, unspecified: Secondary | ICD-10-CM | POA: Diagnosis not present

## 2016-05-18 DIAGNOSIS — Z113 Encounter for screening for infections with a predominantly sexual mode of transmission: Secondary | ICD-10-CM | POA: Diagnosis not present

## 2016-05-18 DIAGNOSIS — K1370 Unspecified lesions of oral mucosa: Secondary | ICD-10-CM | POA: Diagnosis not present

## 2017-08-26 ENCOUNTER — Emergency Department (HOSPITAL_COMMUNITY)
Admission: EM | Admit: 2017-08-26 | Discharge: 2017-08-26 | Disposition: A | Discharge status: Home / Self Care | Payer: BLUE CROSS/BLUE SHIELD | Attending: Emergency Medicine | Admitting: Emergency Medicine

## 2017-08-26 ENCOUNTER — Emergency Department (HOSPITAL_COMMUNITY): Payer: BLUE CROSS/BLUE SHIELD

## 2017-08-26 ENCOUNTER — Encounter (HOSPITAL_COMMUNITY): Payer: Self-pay | Admitting: *Deleted

## 2017-08-26 DIAGNOSIS — K625 Hemorrhage of anus and rectum: Secondary | ICD-10-CM | POA: Diagnosis not present

## 2017-08-26 DIAGNOSIS — Z79899 Other long term (current) drug therapy: Secondary | ICD-10-CM | POA: Diagnosis not present

## 2017-08-26 DIAGNOSIS — R7401 Elevation of levels of liver transaminase levels: Secondary | ICD-10-CM

## 2017-08-26 DIAGNOSIS — R1030 Lower abdominal pain, unspecified: Secondary | ICD-10-CM

## 2017-08-26 DIAGNOSIS — N83201 Unspecified ovarian cyst, right side: Secondary | ICD-10-CM

## 2017-08-26 DIAGNOSIS — R1031 Right lower quadrant pain: Secondary | ICD-10-CM | POA: Diagnosis not present

## 2017-08-26 DIAGNOSIS — N839 Noninflammatory disorder of ovary, fallopian tube and broad ligament, unspecified: Secondary | ICD-10-CM | POA: Diagnosis not present

## 2017-08-26 DIAGNOSIS — R109 Unspecified abdominal pain: Secondary | ICD-10-CM | POA: Diagnosis not present

## 2017-08-26 DIAGNOSIS — R740 Nonspecific elevation of levels of transaminase and lactic acid dehydrogenase [LDH]: Secondary | ICD-10-CM | POA: Diagnosis not present

## 2017-08-26 LAB — URINALYSIS, ROUTINE W REFLEX MICROSCOPIC
Bilirubin Urine: NEGATIVE
Glucose, UA: NEGATIVE mg/dL
HGB URINE DIPSTICK: NEGATIVE
Ketones, ur: 5 mg/dL — AB
Leukocytes, UA: NEGATIVE
Nitrite: NEGATIVE
Protein, ur: NEGATIVE mg/dL
pH: 6 (ref 5.0–8.0)

## 2017-08-26 LAB — COMPREHENSIVE METABOLIC PANEL
ALT: 70 U/L — AB (ref 0–44)
AST: 421 U/L — AB (ref 15–41)
Albumin: 3.8 g/dL (ref 3.5–5.0)
Alkaline Phosphatase: 63 U/L (ref 38–126)
Anion gap: 12 (ref 5–15)
BUN: 7 mg/dL (ref 6–20)
CO2: 27 mmol/L (ref 22–32)
Calcium: 8.8 mg/dL — ABNORMAL LOW (ref 8.9–10.3)
Chloride: 102 mmol/L (ref 98–111)
Creatinine, Ser: 0.92 mg/dL (ref 0.44–1.00)
GFR calc Af Amer: >60 mL/min (ref >60)
GFR calc non Af Amer: >60 mL/min (ref >60)
GLUCOSE: 117 mg/dL — AB (ref 70–99)
Potassium: 3.2 mmol/L — ABNORMAL LOW (ref 3.5–5.1)
SODIUM: 141 mmol/L (ref 135–145)
Total Bilirubin: 0.7 mg/dL (ref 0.3–1.2)
Total Protein: 6.8 g/dL (ref 6.5–8.1)

## 2017-08-26 LAB — WET PREP, GENITAL
Sperm: NONE SEEN
Trich, Wet Prep: NONE SEEN
Yeast Wet Prep HPF POC: NONE SEEN

## 2017-08-26 LAB — CBC
HCT: 37.8 % (ref 36.0–46.0)
HEMOGLOBIN: 12 g/dL (ref 12.0–15.0)
MCH: 30.5 pg (ref 26.0–34.0)
MCHC: 31.7 g/dL (ref 30.0–36.0)
MCV: 96.2 fL (ref 78.0–100.0)
Platelets: 225 10*3/uL (ref 150–400)
RBC: 3.93 MIL/uL (ref 3.87–5.11)
RDW: 12.4 % (ref 11.5–15.5)
WBC: 6.3 10*3/uL (ref 4.0–10.5)

## 2017-08-26 LAB — POC OCCULT BLOOD, ED: Fecal Occult Bld: POSITIVE — AB

## 2017-08-26 LAB — LIPASE, BLOOD: Lipase: 31 U/L (ref 11–51)

## 2017-08-26 LAB — I-STAT BETA HCG BLOOD, ED (MC, WL, AP ONLY)

## 2017-08-26 MED ORDER — ONDANSETRON HCL 4 MG/2ML IJ SOLN
4.0000 mg | Freq: Once | INTRAMUSCULAR | Status: AC
  Administered 2017-08-26: 4 mg via INTRAVENOUS
  Filled 2017-08-26: qty 2

## 2017-08-26 MED ORDER — ONDANSETRON HCL 4 MG PO TABS: 4.0000 mg | ORAL_TABLET | Freq: Four times a day (QID) | ORAL | 0 refills | Status: DC

## 2017-08-26 MED ORDER — IOHEXOL 300 MG/ML  SOLN
100.0000 mL | Freq: Once | INTRAMUSCULAR | Status: AC | PRN
  Administered 2017-08-26: 100 mL via INTRAVENOUS

## 2017-08-26 MED ORDER — SODIUM CHLORIDE 0.9 % IV BOLUS
1000.0000 mL | Freq: Once | INTRAVENOUS | Status: AC
  Administered 2017-08-26: 1000 mL via INTRAVENOUS

## 2017-08-26 MED ORDER — MORPHINE SULFATE (PF) 4 MG/ML IV SOLN
4.0000 mg | Freq: Once | INTRAVENOUS | Status: AC
  Administered 2017-08-26: 4 mg via INTRAVENOUS
  Filled 2017-08-26: qty 1

## 2017-08-26 MED ORDER — METRONIDAZOLE 500 MG PO TABS: 500.0000 mg | ORAL_TABLET | Freq: Two times a day (BID) | ORAL | 0 refills | Status: DC

## 2017-08-26 NOTE — ED Notes (Signed)
Pt denies any further nausea or  Pain

## 2017-08-26 NOTE — Discharge Instructions (Signed)
As we discussed, today your ultrasound showed an ovarian cyst.  It is recommended to have repeat ultrasound in 4 to 6 weeks.  He can follow-up with OB/GYN to establish this.  As we discussed, your liver enzymes are slightly elevated today.  Please limit the amount of alcohol and Tylenol that you consume.  Additionally, he did have some blood noted on your rectal exam.  Your blood levels were stable.  These are things that you need to follow-up with GI about.  Please call their office and arrange for an appointment.  Tell them you are seen in the ED. You will need to have your liver functions repeated.   Your wet prep did come back concerning for BV. Take Flagyl as directed.  It is very important that you do not consume any alcohol while taking this medication as it will cause you to become violently ill.  Sure you are staying hydrated drink plenty of fluids.  Return the emergency department for any fever, worsening abdominal pain, black tarry stools, worsening rectal bleeding, dizziness, lightheadedness, passing out, chest pain, difficulty breathing or any other worsening or concerning symptoms.

## 2017-08-26 NOTE — ED Provider Notes (Signed)
[facility] EMER[facility]ENCY DEPARTMENT Provider Note   CSN: [id number] Arrival date & time: 08/26/17  0749     History   Chief Complaint Chief Complaint  Patient presents with  . Abdominal Pain    HPI Crystal Walters is a 22 y.o. female who presents for evaluation of abdominal pain that began yesterday and 3 days of bright red blood per rectum.  Patient reports that she started noticing some bright red blood with her bowel movements approximately 3 days ago.  She denies any black or tarry stools.  Patient also reports she is having some lower abdominal pain that she describes as "a sharp cramp."  It is worse on the left side.  Patient reports that last night, she started having nausea, vomiting.  Emesis is nonbloody, nonbilious.  Patient states that when she was vomiting, she felt warm but did not measure a temperature.  Patient denies any chest pain, difficulty breathing, urinary complaints, vaginal bleeding.  The history is provided by the patient.    Past Medical History:  Diagnosis Date  . Vaginal Pap smear, abnormal     Patient Active Problem List   Diagnosis Date Noted  . Normal spontaneous vaginal delivery 10/10/2014  . Status post induction of labor 10/10/2014  . Term pregnancy 10/08/2014  . [redacted] weeks gestation of pregnancy   . Encounter for routine screening for malformation using ultrasonics   . Abnormal biochemical finding on antenatal screening of mother     History reviewed. No pertinent surgical history.   OB History    Gravida  1   Para  1   Term  1   Preterm      AB      Living  1     SAB      TAB      Ectopic      Multiple  0   Live Births  1            Home Medications    Prior to Admission medications   Medication Sig Start Date End Date Taking? Authorizing Provider  ibuprofen (ADVIL,MOTRIN) 600 MG tablet Take 1 tablet (600 mg total) by mouth every 6 (six) hours as needed for moderate pain or cramping. Patient not  taking: Reported on 09/11/2015 10/10/14   Woodroe Mode, MD  metroNIDAZOLE (FLAGYL) 500 MG tablet Take 1 tablet (500 mg total) by mouth 2 (two) times daily. 08/26/17   Volanda Napoleon, PA-C  norethindrone (ORTHO MICRONOR) 0.35 MG tablet Take 1 tablet (0.35 mg total) by mouth daily. Start pill pack on 3rd Sunday after delivery. Patient not taking: Reported on 09/11/2015 10/27/14   Woodroe Mode, MD  ondansetron (ZOFRAN) 4 MG tablet Take 1 tablet (4 mg total) by mouth every 6 (six) hours. 08/26/17   Volanda Napoleon, PA-C  Prenatal Vit-Fe Fumarate-FA (PRENATAL MULTIVITAMIN) TABS tablet Take 1 tablet by mouth daily at 12 noon.    [provider]    Family History Family History  Problem Relation Age of Onset  . Hypertension Mother     Social History Social History   Tobacco Use  . Smoking status: Never Smoker  . Smokeless tobacco: Never Used  Substance Use Topics  . Alcohol use: No  . Drug use: Yes    Types: Marijuana    Comment: marijuana- stopped x 10 dys     Allergies   Patient has no known allergies.   Review of Systems Review of Systems  Constitutional:  Negative for chills and fever.  HENT: Negative for congestion.   Eyes: Negative for visual disturbance.  Respiratory: Negative for cough and shortness of breath.   Cardiovascular: Negative for chest pain.  Gastrointestinal: Positive for abdominal pain, blood in stool, nausea and vomiting. Negative for diarrhea.  Genitourinary: Negative for dysuria, hematuria and vaginal bleeding.  Musculoskeletal: Negative for back pain and neck pain.  Skin: Negative for rash.  Neurological: Negative for dizziness, weakness, numbness and headaches.  All other systems reviewed and are negative.    Physical Exam Updated Vital Signs BP 131/63   Pulse 68   Temp 99 F (37.2 C) (Oral)   Resp 17   SpO2 99%   Physical Exam  Constitutional: She is oriented to person, place, and time. She appears well-developed and  well-nourished.  Appears uncomfortable but no acute distresss  HENT:  Head: Normocephalic and atraumatic.  Mouth/Throat: Oropharynx is clear and moist and mucous membranes are normal.  Eyes: Pupils are equal, round, and reactive to light. Conjunctivae, EOM and lids are normal.  Neck: Full passive range of motion without pain.  Cardiovascular: Normal rate, regular rhythm, normal heart sounds and normal pulses. Exam reveals no gallop and no friction rub.  No murmur heard. Pulmonary/Chest: Effort normal and breath sounds normal.  Lungs clear to auscultation bilaterally.  Symmetric chest rise.  No wheezing, rales, rhonchi.  Abdominal: Soft. Normal appearance. There is tenderness in the right lower quadrant, suprapubic area and left lower quadrant. There is no rigidity, no guarding, no CVA tenderness and no tenderness at McBurney's point.  Abdomen is soft, non-distended.  Tenderness palpation to the lower abdomen, particular the left lower quadrant.  No rebounding, rigidity, guarding.  Genitourinary: Uterus normal. Rectal exam shows guaiac positive stool. Rectal exam shows no external hemorrhoid, no mass and no tenderness. Cervix exhibits no motion tenderness. Vaginal discharge found.  Genitourinary Comments: The exam was performed with a chaperone present.  Dried bright red blood noted around the anus.  Small questionable anal fissure.  No external hemorrhoids noted.   No melena on DRE. Normal external female genitalia. No lesions, rash, or sores.  On exam showed white malodorous discharge in the vaginal vault.  No adnexal mass or tenderness noted bilaterally.  Cervix is without friability, discharge, CMT.  Musculoskeletal: Normal range of motion.  Neurological: She is alert and oriented to person, place, and time.  Skin: Skin is warm and dry. Capillary refill takes less than 2 seconds.  Psychiatric: She has a normal mood and affect. Her speech is normal.  Nursing note and vitals reviewed.    ED  Treatments / Results  Labs (all labs ordered are listed, but only abnormal results are displayed) Labs Reviewed  WET PREP, GENITAL - Abnormal; Notable for the following components:      Result Value   Clue Cells Wet Prep HPF POC PRESENT (*)    WBC, Wet Prep HPF POC FEW (*)    All other components within normal limits  COMPREHENSIVE METABOLIC PANEL - Abnormal; Notable for the following components:   Potassium 3.2 (*)    Glucose, Bld 117 (*)    Calcium 8.8 (*)    AST 421 (*)    ALT 70 (*)    All other components within normal limits  URINALYSIS, ROUTINE W REFLEX MICROSCOPIC - Abnormal; Notable for the following components:   Specific Gravity, Urine >1.046 (*)    Ketones, ur 5 (*)    All other components within normal limits  POC  OCCULT BLOOD, ED - Abnormal; Notable for the following components:   Fecal Occult Bld POSITIVE (*)    All other components within normal limits  LIPASE, BLOOD  CBC  I-STAT BETA HCG BLOOD, ED ([facility], [facility], [facility] ONLY)  GC/CHLAMYDIA PROBE AMP ([facility]) NOT AT [facility]    EKG None  Radiology US Transvaginal Non-ob  Result Date: 08/26/2017 CLINICAL DATA:  Abdominal pain. EXAM: TRANSABDOMINAL AND TRANSVAGINAL ULTRASOUND OF PELVIS DOPPLER ULTRASOUND OF OVARIES TECHNIQUE: Both transabdominal and transvaginal ultrasound examinations of the pelvis were performed. Transabdominal technique was performed for global imaging of the pelvis including uterus, ovaries, adnexal regions, and pelvic cul-de-sac. It was necessary to proceed with endovaginal exam following the transabdominal exam to visualize the adnexa. Color and duplex Doppler ultrasound was utilized to evaluate blood flow to the ovaries. COMPARISON:  CT of the abdomen and pelvis 08/26/2017. FINDINGS: Uterus Measurements: 6.8 x 4.2 x 5.8 cm, within normal limits. No fibroids or other mass visualized. Endometrium Thickness: 5 mm, within normal limits. No focal abnormality visualized. Right ovary Measurements: 6.6 x 4.2 x 6  1 cm. A complex heterogeneous cystic area measures 5.6 x 3.8 x 5.3 cm. Left ovary Measurements: 2.5 x 1.7 x 1.9 cm, within normal limits. Normal appearance/no adnexal mass. Pulsed Doppler evaluation of both ovaries demonstrates normal low-resistance arterial and venous waveforms. Other findings Moderate free fluid is present. IMPRESSION: 1. Complex cystic lesion in the right ovary measures 5.6 x 3.8 x 5.3 cm. This may represent a hemorrhagic cyst. Recommend short-term follow-up ultrasound at 6 or 10 weeks to evaluate evolution of this lesion following 1 or 2 additional menstrual cycles. 2. No evidence for ovarian torsion. 3. Moderate free fluid is likely within normal limits. 4. Normal sonographic appearance of the uterus and left ovary. Electronically Signed   By: San Morelle M.D.   On: 08/26/2017 12:35   US Pelvis Complete  Result Date: 08/26/2017 CLINICAL DATA:  Abdominal pain. EXAM: TRANSABDOMINAL AND TRANSVAGINAL ULTRASOUND OF PELVIS DOPPLER ULTRASOUND OF OVARIES TECHNIQUE: Both transabdominal and transvaginal ultrasound examinations of the pelvis were performed. Transabdominal technique was performed for global imaging of the pelvis including uterus, ovaries, adnexal regions, and pelvic cul-de-sac. It was necessary to proceed with endovaginal exam following the transabdominal exam to visualize the adnexa. Color and duplex Doppler ultrasound was utilized to evaluate blood flow to the ovaries. COMPARISON:  CT of the abdomen and pelvis 08/26/2017. FINDINGS: Uterus Measurements: 6.8 x 4.2 x 5.8 cm, within normal limits. No fibroids or other mass visualized. Endometrium Thickness: 5 mm, within normal limits. No focal abnormality visualized. Right ovary Measurements: 6.6 x 4.2 x 6 1 cm. A complex heterogeneous cystic area measures 5.6 x 3.8 x 5.3 cm. Left ovary Measurements: 2.5 x 1.7 x 1.9 cm, within normal limits. Normal appearance/no adnexal mass. Pulsed Doppler evaluation of both ovaries demonstrates  normal low-resistance arterial and venous waveforms. Other findings Moderate free fluid is present. IMPRESSION: 1. Complex cystic lesion in the right ovary measures 5.6 x 3.8 x 5.3 cm. This may represent a hemorrhagic cyst. Recommend short-term follow-up ultrasound at 6 or 10 weeks to evaluate evolution of this lesion following 1 or 2 additional menstrual cycles. 2. No evidence for ovarian torsion. 3. Moderate free fluid is likely within normal limits. 4. Normal sonographic appearance of the uterus and left ovary. Electronically Signed   By: San Morelle M.D.   On: 08/26/2017 12:35   Ct Abdomen Pelvis W Contrast  Result Date: 08/26/2017 CLINICAL DATA:  Acute  generalized abdominal pain.  Bloody stool. EXAM: CT ABDOMEN AND PELVIS WITH CONTRAST TECHNIQUE: Multidetector CT imaging of the abdomen and pelvis was performed using the standard protocol following bolus administration of intravenous contrast. CONTRAST:  119m OMNIPAQUE IOHEXOL 300 MG/ML  SOLN COMPARISON:  None. FINDINGS: Lower chest: No acute abnormality. Hepatobiliary: No focal liver abnormality is seen. No gallstones, gallbladder wall thickening, or biliary dilatation. Pancreas: Unremarkable. No pancreatic ductal dilatation or surrounding inflammatory changes. Spleen: Normal in size without focal abnormality. Adrenals/Urinary Tract: Adrenal glands are unremarkable. Kidneys are normal, without renal calculi, focal lesion, or hydronephrosis. Bladder is unremarkable. Stomach/Bowel: Stomach is within normal limits. Appendix appears normal. No evidence of bowel wall thickening, distention, or inflammatory changes. Vascular/Lymphatic: No significant vascular findings are present. No enlarged abdominal or pelvic lymph nodes. Reproductive: Uterus and left adnexal regions are unremarkable. 5.8 cm right ovarian cyst is noted. Other: No hernia is noted. Small amount of free fluid is noted in the pelvis which may be physiologic. Musculoskeletal: No acute or  significant osseous findings. IMPRESSION: 5.8 cm right ovarian cyst is noted. Pelvic ultrasound is recommended for further evaluation. Electronically Signed   By: JMarijo Conception M.D.   On: 08/26/2017 10:55   [place]/ven Flow Abd Pelv Doppler  Result Date: 08/26/2017 CLINICAL DATA:  Abdominal pain. EXAM: TRANSABDOMINAL AND TRANSVAGINAL ULTRASOUND OF PELVIS DOPPLER ULTRASOUND OF OVARIES TECHNIQUE: Both transabdominal and transvaginal ultrasound examinations of the pelvis were performed. Transabdominal technique was performed for global imaging of the pelvis including uterus, ovaries, adnexal regions, and pelvic cul-de-sac. It was necessary to proceed with endovaginal exam following the transabdominal exam to visualize the adnexa. Color and duplex Doppler ultrasound was utilized to evaluate blood flow to the ovaries. COMPARISON:  CT of the abdomen and pelvis 08/26/2017. FINDINGS: Uterus Measurements: 6.8 x 4.2 x 5.8 cm, within normal limits. No fibroids or other mass visualized. Endometrium Thickness: 5 mm, within normal limits. No focal abnormality visualized. Right ovary Measurements: 6.6 x 4.2 x 6 1 cm. A complex heterogeneous cystic area measures 5.6 x 3.8 x 5.3 cm. Left ovary Measurements: 2.5 x 1.7 x 1.9 cm, within normal limits. Normal appearance/no adnexal mass. Pulsed Doppler evaluation of both ovaries demonstrates normal low-resistance arterial and venous waveforms. Other findings Moderate free fluid is present. IMPRESSION: 1. Complex cystic lesion in the right ovary measures 5.6 x 3.8 x 5.3 cm. This may represent a hemorrhagic cyst. Recommend short-term follow-up ultrasound at 6 or 10 weeks to evaluate evolution of this lesion following 1 or 2 additional menstrual cycles. 2. No evidence for ovarian torsion. 3. Moderate free fluid is likely within normal limits. 4. Normal sonographic appearance of the uterus and left ovary. Electronically Signed   By: CSan MorelleM.D.   On: 08/26/2017 12:35     Procedures Procedures (including critical care time)  Medications Ordered in ED Medications  sodium chloride 0.9 % bolus 1,000 mL (0 mLs Intravenous Stopped 08/26/17 1036)  morphine 4 MG/ML injection 4 mg (4 mg Intravenous Given 08/26/17 0850)  ondansetron (ZOFRAN) injection 4 mg (4 mg Intravenous Given 08/26/17 0849)  iohexol (OMNIPAQUE) 300 MG/ML solution 100 mL (100 mLs Intravenous Contrast Given 08/26/17 1048)     Initial Impression / Assessment and Plan / ED Course  I have reviewed the triage vital signs and the nursing notes.  Pertinent labs & imaging results that were available during my care of the patient were reviewed by me and considered in my medical decision making (see chart for details).  22 y.o. F who presents for evaluation of abdominal pain and blood in stool. Associated with vomiting. Patient is afebrile, non-toxic appearing, sitting comfortably on examination table. Vital signs reviewed and stable.  On exam, she does exhibit tenderness noted to the left lower quadrant.  Consider infectious etiology versus PUD versus GI bleed versus diverticulitis.  Plan to check basic labs, fecal occult.  Fecal occult positive.  I-STAT beta negative.  Lipase unremarkable.  CBC without any significant lucid cytosis or anemia.  CMP shows potassium of 3.2.  BUN and creatinine are unremarkable.  AST is elevated at 421 and ALT is elevated at 70.  No priors for comparison. Alk Phos and Total Bili unremarkable.   CT abdomen and pelvis shows no hepatobiliary abnormality.  Appendix appears normal.  Patient does have a 5.8 cm right ovarian cyst noted.   Discussed results with patient.  She reports feeling better after pain medications and antiemetics.  Repeat abdominal exam is improved.  We will plan to get ultrasound evaluation given that her ovarian cysts greater than 5 cm and puts her at risk for ovarian torsion.  Discussed  with her regarding her elevated LFTs.  Patient denies drinking any  recent alcohol denies any recent Tylenol use.  She does not recall ever having elevated LFTs.  Review of her records show that she had tested for hepatitis but was negative and all her testing.   Pelvic exam as documented above.  Patient did have malodorous white discharge.  Cervix without friability, erythema.  She had no CMT, adnexal tenderness bilaterally.  Exam was not concerning for PID.  Do not suspect Roxana Hires syndrome based on history/physical exam.  Her son reviewed.  There is a cyst noted on the right ovary.  No evidence of ovarian torsion.  Pelvic fluid is seen to be physiologic.  Discussed results with patient.  She reports improvement in pain.  Repeat abdominal exam is benign.  Vital signs are stable.  She is tolerating p.o. in the department any difficulty.  I suspect given the bright red blood, her rectal bleeding is likely due to hemorrhoids or anal fissure.  She does not have any history of PUD and additionally has not had any melena.  On my exam, there is no evidence of melena that would make me concern for GI bleed although it is a consideration.  Given her elevations in LFTs and positive fecal occult blood, will need outpatient GI follow-up.  Her CBC shows stable hemoglobin at this time.  Suspect her pain was caused by ovarian cyst.  I instructed patient follow-up with OB/GYN in the next 4 to 6 weeks for repeat evaluation and ultrasound. At this time, patient exhibits no emergent life-threatening condition that require further evaluation in ED. Patient had ample opportunity for questions and discussion. All patient's questions were answered with full understanding. Strict return precautions discussed. Patient expresses understanding and agreement to plan.   Final Clinical Impressions(s) / ED Diagnoses   Final diagnoses:  Lower abdominal pain  Cyst of right ovary  Rectal bleeding  Transaminitis    ED Discharge Orders        Ordered    ondansetron (ZOFRAN) 4 MG tablet  Every  6 hours     08/26/17 1312    metroNIDAZOLE (FLAGYL) 500 MG tablet  2 times daily     08/26/17 1312       Desma Mcgregor 08/26/17 1551    Quintella Reichert, MD 08/27/17 [phone]

## 2017-08-26 NOTE — ED Triage Notes (Signed)
Pt in c/o abdominal pain for the last three days, reports she has seen dark blood in her stool, she is having diarrhea, reports n/v and fever

## 2017-08-26 NOTE — ED Notes (Signed)
Pt going to ct scan at this time  

## 2017-08-26 NOTE — ED Notes (Signed)
Pt reports a decrease in pain and nausea

## 2017-08-29 ENCOUNTER — Encounter: Payer: Self-pay | Admitting: Gastroenterology

## 2017-08-29 LAB — GC/CHLAMYDIA PROBE AMP (~~LOC~~) NOT AT ARMC
Chlamydia: NEGATIVE
NEISSERIA GONORRHEA: NEGATIVE

## 2017-10-25 ENCOUNTER — Ambulatory Visit: Payer: 59 | Admitting: Gastroenterology

## 2017-11-25 ENCOUNTER — Ambulatory Visit: Payer: 59 | Admitting: Gastroenterology

## 2017-12-15 ENCOUNTER — Ambulatory Visit: Payer: 59 | Admitting: Gastroenterology

## 2018-01-25 NOTE — L&D Delivery Note (Signed)
Delivery Note At 8:56 AM a viable female was delivered via Vaginal, Spontaneous (Presentation: direct OA, roated to LOA with delivery).  APGAR: 8, 9; weight pending.   Placenta status: spontaneous, intact.  Cord: 3 vessels  Anesthesia:  epidural Episiotomy: None Lacerations: None Suture Repair: n/a Est. Blood Loss (mL): 100  Mom to postpartum.  Baby to Couplet care / Skin to Skin.  Wende Mott CNM 10/21/2018, 9:05 AM

## 2018-02-15 ENCOUNTER — Encounter: Payer: Self-pay | Admitting: Family Medicine

## 2018-02-15 ENCOUNTER — Ambulatory Visit (INDEPENDENT_AMBULATORY_CARE_PROVIDER_SITE_OTHER): Payer: BLUE CROSS/BLUE SHIELD

## 2018-02-15 DIAGNOSIS — Z3201 Encounter for pregnancy test, result positive: Secondary | ICD-10-CM | POA: Diagnosis not present

## 2018-02-15 NOTE — Progress Notes (Signed)
Pt here today for pregnancy test.  Results positive.  Pt reports LMP 01/07/18  EDD 10/14/18  5w 4d.  Medications reconciled.  List of medications safe to take in pregnancy given to pt.  Front office to provide proof of pregnancy letter to start pregnancy letter.

## 2018-02-15 NOTE — Progress Notes (Signed)
Chart reviewed for nurse visit. Agree with plan of care.   Arvilla Market, DO 02/15/2018 7:19 PM

## 2018-02-16 LAB — POCT PREGNANCY, URINE: PREG TEST UR: POSITIVE — AB

## 2018-02-17 ENCOUNTER — Emergency Department (HOSPITAL_COMMUNITY): Payer: BLUE CROSS/BLUE SHIELD

## 2018-02-17 ENCOUNTER — Emergency Department (HOSPITAL_COMMUNITY)
Admission: EM | Admit: 2018-02-17 | Discharge: 2018-02-17 | Disposition: A | Discharge status: Home / Self Care | Payer: BLUE CROSS/BLUE SHIELD | Attending: Emergency Medicine | Admitting: Emergency Medicine

## 2018-02-17 ENCOUNTER — Other Ambulatory Visit: Payer: Self-pay

## 2018-02-17 ENCOUNTER — Encounter (HOSPITAL_COMMUNITY): Payer: Self-pay | Admitting: Emergency Medicine

## 2018-02-17 DIAGNOSIS — F17210 Nicotine dependence, cigarettes, uncomplicated: Secondary | ICD-10-CM | POA: Diagnosis not present

## 2018-02-17 DIAGNOSIS — O99331 Smoking (tobacco) complicating pregnancy, first trimester: Secondary | ICD-10-CM | POA: Insufficient documentation

## 2018-02-17 DIAGNOSIS — R109 Unspecified abdominal pain: Secondary | ICD-10-CM | POA: Diagnosis not present

## 2018-02-17 DIAGNOSIS — R112 Nausea with vomiting, unspecified: Secondary | ICD-10-CM

## 2018-02-17 DIAGNOSIS — O219 Vomiting of pregnancy, unspecified: Secondary | ICD-10-CM | POA: Insufficient documentation

## 2018-02-17 DIAGNOSIS — R197 Diarrhea, unspecified: Secondary | ICD-10-CM

## 2018-02-17 DIAGNOSIS — O211 Hyperemesis gravidarum with metabolic disturbance: Secondary | ICD-10-CM | POA: Diagnosis not present

## 2018-02-17 DIAGNOSIS — O26891 Other specified pregnancy related conditions, first trimester: Secondary | ICD-10-CM | POA: Diagnosis not present

## 2018-02-17 DIAGNOSIS — O9989 Other specified diseases and conditions complicating pregnancy, childbirth and the puerperium: Secondary | ICD-10-CM | POA: Diagnosis not present

## 2018-02-17 DIAGNOSIS — Z3A01 Less than 8 weeks gestation of pregnancy: Secondary | ICD-10-CM | POA: Insufficient documentation

## 2018-02-17 DIAGNOSIS — E860 Dehydration: Secondary | ICD-10-CM

## 2018-02-17 LAB — CBC WITH DIFFERENTIAL/PLATELET
ABS IMMATURE GRANULOCYTES: 0.01 10*3/uL (ref 0.00–0.07)
BASOS ABS: 0 10*3/uL (ref 0.0–0.1)
BASOS PCT: 0 %
EOS PCT: 1 %
Eosinophils Absolute: 0 10*3/uL (ref 0.0–0.5)
HCT: 36.9 % (ref 36.0–46.0)
Hemoglobin: 11.7 g/dL — ABNORMAL LOW (ref 12.0–15.0)
Immature Granulocytes: 0 %
Lymphocytes Relative: 33 %
Lymphs Abs: 1.4 10*3/uL (ref 0.7–4.0)
MCH: 29.3 pg (ref 26.0–34.0)
MCHC: 31.7 g/dL (ref 30.0–36.0)
MCV: 92.3 fL (ref 80.0–100.0)
MONO ABS: 0.4 10*3/uL (ref 0.1–1.0)
Monocytes Relative: 10 %
NEUTROS ABS: 2.4 10*3/uL (ref 1.7–7.7)
NRBC: 0 % (ref 0.0–0.2)
Neutrophils Relative %: 56 %
Platelets: 255 10*3/uL (ref 150–400)
RBC: 4 MIL/uL (ref 3.87–5.11)
RDW: 12.8 % (ref 11.5–15.5)
WBC: 4.4 10*3/uL (ref 4.0–10.5)

## 2018-02-17 LAB — URINALYSIS, ROUTINE W REFLEX MICROSCOPIC
Bilirubin Urine: NEGATIVE
Glucose, UA: NEGATIVE mg/dL
Hgb urine dipstick: NEGATIVE
Ketones, ur: 5 mg/dL — AB
LEUKOCYTES UA: NEGATIVE
NITRITE: NEGATIVE
PH: 5 (ref 5.0–8.0)
Protein, ur: NEGATIVE mg/dL
SPECIFIC GRAVITY, URINE: 1.028 (ref 1.005–1.030)

## 2018-02-17 LAB — COMPREHENSIVE METABOLIC PANEL
ALBUMIN: 3.9 g/dL (ref 3.5–5.0)
ALT: 14 U/L (ref 0–44)
ANION GAP: 10 (ref 5–15)
AST: 22 U/L (ref 15–41)
Alkaline Phosphatase: 49 U/L (ref 38–126)
BUN: 8 mg/dL (ref 6–20)
CHLORIDE: 102 mmol/L (ref 98–111)
CO2: 24 mmol/L (ref 22–32)
Calcium: 9.2 mg/dL (ref 8.9–10.3)
Creatinine, Ser: 0.84 mg/dL (ref 0.44–1.00)
GFR calc non Af Amer: >60 mL/min (ref >60)
Glucose, Bld: 110 mg/dL — ABNORMAL HIGH (ref 70–99)
Potassium: 3.5 mmol/L (ref 3.5–5.1)
SODIUM: 136 mmol/L (ref 135–145)
Total Bilirubin: 0.4 mg/dL (ref 0.3–1.2)
Total Protein: 7 g/dL (ref 6.5–8.1)

## 2018-02-17 LAB — WET PREP, GENITAL
CLUE CELLS WET PREP: NONE SEEN
TRICH WET PREP: NONE SEEN
Yeast Wet Prep HPF POC: NONE SEEN

## 2018-02-17 LAB — HCG, QUANTITATIVE, PREGNANCY: hCG, Beta Chain, Quant, S: 13596 m[IU]/mL — ABNORMAL HIGH (ref <5)

## 2018-02-17 MED ORDER — SODIUM CHLORIDE 0.9 % IV BOLUS
1000.0000 mL | Freq: Once | INTRAVENOUS | Status: AC
  Administered 2018-02-17: 1000 mL via INTRAVENOUS

## 2018-02-17 MED ORDER — ONDANSETRON 4 MG PO TBDP: 4.0000 mg | ORAL_TABLET | Freq: Three times a day (TID) | ORAL | 0 refills | Status: DC | PRN

## 2018-02-17 MED ORDER — ONDANSETRON HCL 4 MG/2ML IJ SOLN
4.0000 mg | Freq: Once | INTRAMUSCULAR | Status: AC
  Administered 2018-02-17: 4 mg via INTRAVENOUS
  Filled 2018-02-17: qty 2

## 2018-02-17 NOTE — ED Provider Notes (Signed)
[facility] EMERGENCY DEPARTMENT Provider Note   CSN: [id number] Arrival date & time: 02/17/18  1107     History   Chief Complaint Chief Complaint  Patient presents with  . Abdominal Pain    HPI Caili Krauser is a 23 y.o. female.  Pt presents to the ED today with lower abdominal pain.  Pt's LMP was 01/07/18 and she is 5 wks and 6 days pregnant.   She woke up this am around 0400 with cramping lower abdominal pain.  The pt said she has had n/v/d today.  She denies any vaginal bleeding, but has had a d/c.  Blood type on chart review is 0+.     Past Medical History:  Diagnosis Date  . Vaginal Pap smear, abnormal     Patient Active Problem List   Diagnosis Date Noted  . Normal spontaneous vaginal delivery 10/10/2014  . Status post induction of labor 10/10/2014  . Term pregnancy 10/08/2014  . [redacted] weeks gestation of pregnancy   . Encounter for routine screening for malformation using ultrasonics   . Abnormal biochemical finding on antenatal screening of mother     History reviewed. No pertinent surgical history.   OB History    Gravida  1   Para  1   Term  1   Preterm      AB      Living  1     SAB      TAB      Ectopic      Multiple  0   Live Births  1            Home Medications    Prior to Admission medications   Medication Sig Start Date End Date Taking? Authorizing Provider  ibuprofen (ADVIL,MOTRIN) 600 MG tablet Take 1 tablet (600 mg total) by mouth every 6 (six) hours as needed for moderate pain or cramping. Patient not taking: Reported on 09/11/2015 10/10/14   Adam Phenix, MD  metroNIDAZOLE (FLAGYL) 500 MG tablet Take 1 tablet (500 mg total) by mouth 2 (two) times daily. 08/26/17   Maxwell Caul, PA-C  norethindrone (ORTHO MICRONOR) 0.35 MG tablet Take 1 tablet (0.35 mg total) by mouth daily. Start pill pack on 3rd Sunday after delivery. Patient not taking: Reported on 09/11/2015 10/27/14   Adam Phenix, MD    ondansetron (ZOFRAN ODT) 4 MG disintegrating tablet Take 1 tablet (4 mg total) by mouth every 8 (eight) hours as needed. 02/17/18   Jacalyn Lefevre, MD  ondansetron (ZOFRAN) 4 MG tablet Take 1 tablet (4 mg total) by mouth every 6 (six) hours. 08/26/17   Maxwell Caul, PA-C  Prenatal Vit-Fe Fumarate-FA (PRENATAL MULTIVITAMIN) TABS tablet Take 1 tablet by mouth daily at 12 noon.    [provider]    Family History Family History  Problem Relation Age of Onset  . Hypertension Mother     Social History Social History   Tobacco Use  . Smoking status: Current Every Day Smoker  . Smokeless tobacco: Never Used  Substance Use Topics  . Alcohol use: No  . Drug use: Yes    Types: Marijuana    Comment: marijuana- stopped x 10 dys     Allergies   Patient has no known allergies.   Review of Systems Review of Systems  Gastrointestinal: Positive for abdominal pain.  Genitourinary: Positive for vaginal discharge.  All other systems reviewed and are negative.    Physical Exam Updated Vital Signs  BP 130/71 (BP Location: Left Arm)   Pulse (!) 109   Temp 98.7 F (37.1 C) (Oral)   Resp 18   SpO2 100%   Physical Exam Vitals signs and nursing note reviewed. Exam conducted with a chaperone present.  Constitutional:      Appearance: She is well-developed.  HENT:     Head: Normocephalic and atraumatic.     Mouth/Throat:     Mouth: Mucous membranes are dry.     Pharynx: Oropharynx is clear.  Eyes:     Extraocular Movements: Extraocular movements intact.     Pupils: Pupils are equal, round, and reactive to light.  Cardiovascular:     Rate and Rhythm: Regular rhythm. Tachycardia present.  Pulmonary:     Effort: Pulmonary effort is normal.     Breath sounds: Normal breath sounds.  Abdominal:     General: Abdomen is flat and scaphoid. Bowel sounds are normal.     Palpations: Abdomen is soft.     Tenderness: There is abdominal tenderness in the right lower quadrant,  suprapubic area and left lower quadrant.  Genitourinary:    Exam position: Lithotomy position.     Vagina: Normal.     Cervix: Discharge present. No cervical bleeding.     Uterus: Normal.      Adnexa:        Right: Tenderness present.        Left: Tenderness present.   Skin:    General: Skin is warm.     Capillary Refill: Capillary refill takes less than 2 seconds.  Neurological:     General: No focal deficit present.     Mental Status: She is alert and oriented to person, place, and time.  Psychiatric:        Mood and Affect: Mood normal.        Behavior: Behavior normal.      ED Treatments / Results  Labs (all labs ordered are listed, but only abnormal results are displayed) Labs Reviewed  WET PREP, GENITAL - Abnormal; Notable for the following components:      Result Value   WBC, Wet Prep HPF POC FEW (*)    All other components within normal limits  CBC WITH DIFFERENTIAL/PLATELET - Abnormal; Notable for the following components:   Hemoglobin 11.7 (*)    All other components within normal limits  COMPREHENSIVE METABOLIC PANEL - Abnormal; Notable for the following components:   Glucose, Bld 110 (*)    All other components within normal limits  URINALYSIS, ROUTINE W REFLEX MICROSCOPIC - Abnormal; Notable for the following components:   APPearance HAZY (*)    Ketones, ur 5 (*)    All other components within normal limits  HCG, QUANTITATIVE, PREGNANCY - Abnormal; Notable for the following components:   hCG, Beta Chain, Quant, S 13,596 (*)    All other components within normal limits  HIV ANTIBODY (ROUTINE TESTING W REFLEX)  GC/CHLAMYDIA PROBE AMP ([facility]) NOT AT [facility]    EKG None  Radiology US Ob Comp < 14 Wks  Result Date: 02/17/2018 CLINICAL DATA:  Abdominal pain since 4 a.m.  Patient is pregnant. EXAM: OBSTETRIC <14 WK [place] AND TRANSVAGINAL OB US TECHNIQUE: Both transabdominal and transvaginal ultrasound examinations were performed for complete evaluation of  the gestation as well as the maternal uterus, adnexal regions, and pelvic cul-de-sac. Transvaginal technique was performed to assess early pregnancy. COMPARISON:  None. FINDINGS: Intrauterine gestational sac: Single Yolk sac:  Visualized. Embryo:  Not Visualized. Cardiac Activity:  Not Visualized. Heart Rate: Does not apply MSD: 10.9 mm   5 w   6 d Subchorionic hemorrhage:  None visualized. Maternal uterus/adnexae: The right ovary is not visualized. There is a hypoechoic lesion in the left ovary measures 2.1 x 1.4 x 2.5 cm. Small amount of free fluid is identified in the pelvis. IMPRESSION: Single intrauterine gestational sac identified with a yolk sac noted. No fetal pole is identified. The mean sac diameter measures to 5 weeks and 6 days. The findings may be due to early pregnancy. Short-term follow-up ultrasound is recommended. Electronically Signed   By: Sherian ReinWei-Chen  Lin M.D.   On: 02/17/2018 14:39   [place] Ob Transvaginal  Result Date: 02/17/2018 CLINICAL DATA:  Abdominal pain since 4 a.m.  Patient is pregnant. EXAM: OBSTETRIC <14 WK US AND TRANSVAGINAL OB US TECHNIQUE: Both transabdominal and transvaginal ultrasound examinations were performed for complete evaluation of the gestation as well as the maternal uterus, adnexal regions, and pelvic cul-de-sac. Transvaginal technique was performed to assess early pregnancy. COMPARISON:  None. FINDINGS: Intrauterine gestational sac: Single Yolk sac:  Visualized. Embryo:  Not Visualized. Cardiac Activity: Not Visualized. Heart Rate: Does not apply MSD: 10.9 mm   5 w   6 d Subchorionic hemorrhage:  None visualized. Maternal uterus/adnexae: The right ovary is not visualized. There is a hypoechoic lesion in the left ovary measures 2.1 x 1.4 x 2.5 cm. Small amount of free fluid is identified in the pelvis. IMPRESSION: Single intrauterine gestational sac identified with a yolk sac noted. No fetal pole is identified. The mean sac diameter measures to 5 weeks and 6 days. The  findings may be due to early pregnancy. Short-term follow-up ultrasound is recommended. Electronically Signed   By: Sherian ReinWei-Chen  Lin M.D.   On: 02/17/2018 14:39    Procedures Procedures (including critical care time)  Medications Ordered in ED Medications  sodium chloride 0.9 % bolus 1,000 mL (0 mLs Intravenous Stopped 02/17/18 1342)  ondansetron (ZOFRAN) injection 4 mg (4 mg Intravenous Given 02/17/18 1208)     Initial Impression / Assessment and Plan / ED Course  I have reviewed the triage vital signs and the nursing notes.  Pertinent labs & imaging results that were available during my care of the patient were reviewed by me and considered in my medical decision making (see chart for details).     US consistent with pt's LMP.  No ectopic. After IVFs and Zofran, pt feels much better.  She is instructed to return if worse.  Final Clinical Impressions(s) / ED Diagnoses   Final diagnoses:  Less than [redacted] weeks gestation of pregnancy  Dehydration  Nausea vomiting and diarrhea    ED Discharge Orders         Ordered    ondansetron (ZOFRAN ODT) 4 MG disintegrating tablet  Every 8 hours PRN     02/17/18 1452           Jacalyn LefevreHaviland, Dejohn Ibarra, MD 02/17/18 1454

## 2018-02-17 NOTE — ED Notes (Signed)
To US

## 2018-02-17 NOTE — ED Triage Notes (Addendum)
Woke up with sharp abd pain, n/v this am states is 5 weeks preg, has a slight  Vag d/c that comes and goes denies odor LMP Jan 07 2018 , G 2 P1  A  0 L 0, occasional dysuria

## 2018-02-18 LAB — HIV ANTIBODY (ROUTINE TESTING W REFLEX): HIV SCREEN 4TH GENERATION: NONREACTIVE

## 2018-02-20 LAB — GC/CHLAMYDIA PROBE AMP (~~LOC~~) NOT AT ARMC
Chlamydia: NEGATIVE
Neisseria Gonorrhea: NEGATIVE

## 2018-04-03 ENCOUNTER — Other Ambulatory Visit (HOSPITAL_COMMUNITY): Payer: Self-pay | Admitting: Obstetrics & Gynecology

## 2018-04-03 DIAGNOSIS — Z3481 Encounter for supervision of other normal pregnancy, first trimester: Secondary | ICD-10-CM | POA: Diagnosis not present

## 2018-04-05 ENCOUNTER — Encounter (HOSPITAL_COMMUNITY): Payer: Self-pay

## 2018-04-07 ENCOUNTER — Other Ambulatory Visit (HOSPITAL_COMMUNITY): Payer: Self-pay | Admitting: Family

## 2018-04-07 DIAGNOSIS — Z369 Encounter for antenatal screening, unspecified: Secondary | ICD-10-CM

## 2018-04-11 ENCOUNTER — Encounter (HOSPITAL_COMMUNITY): Payer: Self-pay | Admitting: *Deleted

## 2018-04-12 ENCOUNTER — Ambulatory Visit (HOSPITAL_COMMUNITY): Payer: BLUE CROSS/BLUE SHIELD

## 2018-04-12 ENCOUNTER — Ambulatory Visit (HOSPITAL_COMMUNITY): Payer: BLUE CROSS/BLUE SHIELD | Admitting: *Deleted

## 2018-04-12 ENCOUNTER — Other Ambulatory Visit: Payer: Self-pay

## 2018-04-12 ENCOUNTER — Encounter (HOSPITAL_COMMUNITY): Payer: Self-pay

## 2018-04-12 ENCOUNTER — Ambulatory Visit (HOSPITAL_COMMUNITY)
Admission: RE | Admit: 2018-04-12 | Discharge: 2018-04-12 | Disposition: A | Discharge status: Home / Self Care | Payer: BLUE CROSS/BLUE SHIELD | Source: Ambulatory Visit | Attending: Obstetrics and Gynecology | Admitting: Obstetrics and Gynecology

## 2018-04-12 VITALS — BP 118/68 | HR 69 | Temp 98.9°F | Wt 141.2 lb

## 2018-04-12 DIAGNOSIS — Z3682 Encounter for antenatal screening for nuchal translucency: Secondary | ICD-10-CM | POA: Diagnosis not present

## 2018-04-12 DIAGNOSIS — Z3A13 13 weeks gestation of pregnancy: Secondary | ICD-10-CM | POA: Diagnosis not present

## 2018-04-12 DIAGNOSIS — Z369 Encounter for antenatal screening, unspecified: Secondary | ICD-10-CM

## 2018-04-12 HISTORY — DX: Chlamydial infection, unspecified: A74.9

## 2018-04-12 HISTORY — DX: Unspecified convulsions: R56.9

## 2018-04-18 ENCOUNTER — Other Ambulatory Visit (HOSPITAL_COMMUNITY): Payer: Self-pay | Admitting: Obstetrics and Gynecology

## 2018-04-30 ENCOUNTER — Encounter (HOSPITAL_COMMUNITY): Payer: Self-pay | Admitting: *Deleted

## 2018-04-30 ENCOUNTER — Inpatient Hospital Stay (HOSPITAL_COMMUNITY)
Admission: AD | Admit: 2018-04-30 | Discharge: 2018-04-30 | Disposition: A | Discharge status: Home / Self Care | Payer: BLUE CROSS/BLUE SHIELD | Attending: Obstetrics and Gynecology | Admitting: Obstetrics and Gynecology

## 2018-04-30 DIAGNOSIS — F17200 Nicotine dependence, unspecified, uncomplicated: Secondary | ICD-10-CM | POA: Diagnosis not present

## 2018-04-30 DIAGNOSIS — Z3A16 16 weeks gestation of pregnancy: Secondary | ICD-10-CM

## 2018-04-30 DIAGNOSIS — O219 Vomiting of pregnancy, unspecified: Secondary | ICD-10-CM

## 2018-04-30 DIAGNOSIS — O99332 Smoking (tobacco) complicating pregnancy, second trimester: Secondary | ICD-10-CM | POA: Diagnosis not present

## 2018-04-30 HISTORY — DX: Major depressive disorder, single episode, unspecified: F32.9

## 2018-04-30 HISTORY — DX: Depression, unspecified: F32.A

## 2018-04-30 MED ORDER — LACTATED RINGERS IV BOLUS
1000.0000 mL | Freq: Once | INTRAVENOUS | Status: AC
  Administered 2018-04-30: 07:00:00 1000 mL via INTRAVENOUS

## 2018-04-30 MED ORDER — ONDANSETRON HCL 4 MG/2ML IJ SOLN
4.0000 mg | Freq: Once | INTRAMUSCULAR | Status: AC
  Administered 2018-04-30: 09:00:00 4 mg via INTRAVENOUS
  Filled 2018-04-30: qty 2

## 2018-04-30 MED ORDER — FAMOTIDINE 20 MG IN NS 100 ML IVPB
20.0000 mg | Freq: Once | INTRAVENOUS | Status: AC
  Administered 2018-04-30: 20 mg via INTRAVENOUS
  Filled 2018-04-30: qty 100

## 2018-04-30 MED ORDER — ONDANSETRON 4 MG PO TBDP
4.0000 mg | ORAL_TABLET | Freq: Once | ORAL | Status: AC
  Administered 2018-04-30: 07:00:00 4 mg via ORAL
  Filled 2018-04-30: qty 1

## 2018-04-30 MED ORDER — ONDANSETRON HCL 4 MG PO TABS: 4.0000 mg | ORAL_TABLET | Freq: Three times a day (TID) | ORAL | 0 refills | Status: DC | PRN

## 2018-04-30 MED ORDER — PROMETHAZINE HCL 25 MG PO TABS: 25.0000 mg | ORAL_TABLET | Freq: Four times a day (QID) | ORAL | 0 refills | Status: DC | PRN

## 2018-04-30 MED ORDER — PROMETHAZINE HCL 25 MG/ML IJ SOLN
25.0000 mg | Freq: Four times a day (QID) | INTRAMUSCULAR | Status: DC | PRN
  Administered 2018-04-30: 08:00:00 25 mg via INTRAVENOUS
  Filled 2018-04-30: qty 1

## 2018-04-30 NOTE — MAU Note (Addendum)
Vomiting since 0200. STates has had n/v entire pregnancy. No meds for nausea. No diarrhea. Denies vag bleeding or d/c. Pt came from [facility] ED via w/c. Unable to void currently. STates when vomits urinates on self.

## 2018-04-30 NOTE — MAU Provider Note (Addendum)
History     CSN: [id number]  Arrival date and time: 04/30/18 7829   First Provider Initiated Contact with Patient 04/30/18 [phone]      Chief Complaint  Patient presents with  . Emesis During Pregnancy   HPI Crystal Walters is a 23 y.o. G2P1001 at [redacted]w[redacted]d who presents to MAU with chief complaint of nausea and vomiting. This is a recurring problem for which patient has been seen in [facility] ED 02/17/2018. She was prescribed Zofran at that time but has not picked up the prescription. She reports multiple episodes of vomiting overnight but tolerated lasagna without difficulty yesterday.   Patient states she is unable to void upon arrival and triage in MAU. She states she can feel herself voiding when she strains to vomit "so my bladder is empty all the time".  She lives with her spouse, denies thoughts of SI, HI. Denies concern for IPV.  OB History    Gravida  2   Para  1   Term  1   Preterm      AB      Living  1     SAB      TAB      Ectopic      Multiple  0   Live Births  1           Past Medical History:  Diagnosis Date  . Chlamydia   . Depression   . Seizures (HCC)    childhood  . Vaginal Pap smear, abnormal     Past Surgical History:  Procedure Laterality Date  . NO PAST SURGERIES      Family History  Problem Relation Age of Onset  . Hypertension Mother     Social History   Tobacco Use  . Smoking status: Current Every Day Smoker  . Smokeless tobacco: Never Used  Substance Use Topics  . Alcohol use: No  . Drug use: Yes    Types: Marijuana    Comment: marijuana- stopped x 10 dys    Allergies: No Known Allergies  Medications Prior to Admission  Medication Sig Dispense Refill Last Dose  . Prenatal Vit-Fe Fumarate-FA (PRENATAL MULTIVITAMIN) TABS tablet Take 1 tablet by mouth daily at 12 noon.   04/29/2018 at Unknown time  . ibuprofen (ADVIL,MOTRIN) 600 MG tablet Take 1 tablet (600 mg total) by mouth every 6 (six) hours as needed for  moderate pain or cramping. (Patient not taking: Reported on 09/11/2015) 30 tablet 0 Not Taking at Unknown time  . metroNIDAZOLE (FLAGYL) 500 MG tablet Take 1 tablet (500 mg total) by mouth 2 (two) times daily. (Patient not taking: Reported on 04/12/2018) 14 tablet 0 Not Taking  . norethindrone (ORTHO MICRONOR) 0.35 MG tablet Take 1 tablet (0.35 mg total) by mouth daily. Start pill pack on 3rd Sunday after delivery. (Patient not taking: Reported on 09/11/2015) 1 Package 11 Not Taking at Unknown time  . ondansetron (ZOFRAN ODT) 4 MG disintegrating tablet Take 1 tablet (4 mg total) by mouth every 8 (eight) hours as needed. 10 tablet 0   . ondansetron (ZOFRAN) 4 MG tablet Take 1 tablet (4 mg total) by mouth every 6 (six) hours. 4 tablet 0 Taking   Review of Systems  Constitutional: Positive for fatigue. Negative for chills and fever.  Respiratory: Negative for shortness of breath.   Gastrointestinal: Positive for nausea and vomiting. Negative for abdominal pain.  Genitourinary: Negative for difficulty urinating, dysuria, flank pain, vaginal bleeding, vaginal discharge and vaginal pain.  Musculoskeletal: Negative for back pain.  All other systems reviewed and are negative.  Physical Exam   Blood pressure (!) 104/48, pulse 67, temperature 97.9 F (36.6 C), resp. rate 18, last menstrual period 01/07/2018, unknown if currently breastfeeding.  Physical Exam  Nursing note and vitals reviewed. Constitutional: She is oriented to person, place, and time. She appears well-developed and well-nourished. She appears lethargic. She is cooperative.  Cardiovascular: Normal rate.  Respiratory: Effort normal.  GI: She exhibits no distension. There is no abdominal tenderness. There is no rebound and no guarding.  Neurological: She is oriented to person, place, and time. She appears lethargic.  Skin: Skin is warm and dry.  Psychiatric: She has a normal mood and affect. Her behavior is normal. Judgment and thought  content normal.    MAU Course/MDM  Procedures No results found for this or any previous visit (from the past 24 hour(s)).    Patient continues to refuse a urine specimen at time of handoff report.  Report given to E. Lyman Bishop, NP who assumes care of patient at this time.  Clayton Bibles, CNM 04/30/18  8:06 AM     Patient given 2 liters of IV fluids, phenergan, pepcid, & zofran. No further vomiting while in MAU. Patient was able to void x 2 but refused to give urine sample.   Assessment and Plan  A: 1. Nausea and vomiting during pregnancy prior to [redacted] weeks gestation   2. [redacted] weeks gestation of pregnancy    P: Discharge home Rx zofran tabs (per patient request, doesn't like ODT) & phenergan Discussed expectations with meds Keep f/u at [facility] this week Return precautions discussed  Judeth Horn, NP

## 2018-04-30 NOTE — Discharge Instructions (Signed)
Hyperemesis Gravidarum  Hyperemesis gravidarum is a severe form of nausea and vomiting that happens during pregnancy. Hyperemesis is worse than morning sickness. It may cause you to have nausea or vomiting all day for many days. It may keep you from eating and drinking enough food and liquids, which can lead to dehydration, malnutrition, and weight loss. Hyperemesis usually occurs during the first half (the first 20 weeks) of pregnancy. It often goes away once a woman is in her second half of pregnancy. However, sometimes hyperemesis continues through an entire pregnancy.  What are the causes?  The cause of this condition is not known. It may be related to changes in chemicals (hormones) in the body during pregnancy, such as the high level of pregnancy hormone (human chorionic gonadotropin) or the increase in the female sex hormone (estrogen).  What are the signs or symptoms?  Symptoms of this condition include:  Nausea that does not go away.  Vomiting that does not allow you to keep any food down.  Weight loss.  Body fluid loss (dehydration).  Having no desire to eat, or not liking food that you have previously enjoyed.  How is this diagnosed?  This condition may be diagnosed based on:  A physical exam.  Your medical history.  Your symptoms.  Blood tests.  Urine tests.  How is this treated?  This condition is managed by controlling symptoms. This may include:  Following an eating plan. This can help lessen nausea and vomiting.  Taking prescription medicines.  An eating plan and medicines are often used together to help control symptoms. If medicines do not help relieve nausea and vomiting, you may need to receive fluids through an IV at the hospital.  Follow these instructions at home:  Eating and drinking    Avoid the following:  Drinking fluids with meals. Try not to drink anything during the 30 minutes before and after your meals.  Drinking more than 1 cup of fluid at a time.  Eating foods that trigger your  symptoms. These may include spicy foods, coffee, high-fat foods, very sweet foods, and acidic foods.  Skipping meals. Nausea can be more intense on an empty stomach. If you cannot tolerate food, do not force it. Try sucking on ice chips or other frozen items and make up for missed calories later.  Lying down within 2 hours after eating.  Being exposed to environmental triggers. These may include food smells, smoky rooms, closed spaces, rooms with strong smells, warm or humid places, overly loud and noisy rooms, and rooms with motion or flickering lights. Try eating meals in a well-ventilated area that is free of strong smells.  Quick and sudden changes in your movement.  Taking iron pills and multivitamins that contain iron. If you take prescription iron pills, do not stop taking them unless your health care provider approves.  Preparing food. The smell of food can spoil your appetite or trigger nausea.  To help relieve your symptoms:  Listen to your body. Everyone is different and has different preferences. Find what works best for you.  Eat and drink slowly.  Eat 5-6 small meals daily instead of 3 large meals. Eating small meals and snacks can help you avoid an empty stomach.  In the morning, before getting out of bed, eat a couple of crackers to avoid moving around on an empty stomach.  Try eating starchy foods as these are usually tolerated well. Examples include cereal, toast, bread, potatoes, pasta, rice, and pretzels.  Include at   least 1 serving of protein with your meals and snacks. Protein options include lean meats, poultry, seafood, beans, nuts, nut butters, eggs, cheese, and yogurt.  Try eating a protein-rich snack before bed. Examples of a protein-rick snack include cheese and crackers or a peanut butter sandwich made with 1 slice of whole-wheat bread and 1 tsp (5 g) of peanut butter.  Eat or suck on things that have ginger in them. It may help relieve nausea. Add  tsp ground ginger to hot tea or  choose ginger tea.  Try drinking 100% fruit juice or an electrolyte drink. An electrolyte drink contains sodium, potassium, and chloride.  Drink fluids that are cold, clear, and carbonated or sour. Examples include lemonade, ginger ale, lemon-lime soda, ice water, and sparkling water.  Brush your teeth or use a mouth rinse after meals.  Talk with your health care provider about starting a supplement of vitamin B6.  General instructions  Take over-the-counter and prescription medicines only as told by your health care provider.  Follow instructions from your health care provider about eating or drinking restrictions.  Continue to take your prenatal vitamins as told by your health care provider. If you are having trouble taking your prenatal vitamins, talk with your health care provider about different options.  Keep all follow-up and pre-birth (prenatal) visits as told by your health care provider. This is important.  Contact a health care provider if:  You have pain in your abdomen.  You have a severe headache.  You have vision problems.  You are losing weight.  You feel weak or dizzy.  Get help right away if:  You cannot drink fluids without vomiting.  You vomit blood.  You have constant nausea and vomiting.  You are very weak.  You faint.  You have a fever and your symptoms suddenly get worse.  Summary  Hyperemesis gravidarum is a severe form of nausea and vomiting that happens during pregnancy.  Making some changes to your eating habits may help relieve nausea and vomiting.  This condition may be managed with medicine.  If medicines do not help relieve nausea and vomiting, you may need to receive fluids through an IV at the hospital.  This information is not intended to replace advice given to you by your health care provider. Make sure you discuss any questions you have with your health care provider.  Document Released: 01/11/2005 Document Revised: 01/31/2017 Document Reviewed: 09/10/2015  Elsevier Interactive  Patient Education  2019 [employer]

## 2018-04-30 NOTE — ED Notes (Signed)
Report given to MAU, Transport has been called to take to MAU

## 2018-05-01 ENCOUNTER — Other Ambulatory Visit: Payer: Self-pay

## 2018-05-01 ENCOUNTER — Encounter (HOSPITAL_COMMUNITY): Payer: Self-pay | Admitting: Emergency Medicine

## 2018-05-01 ENCOUNTER — Inpatient Hospital Stay (HOSPITAL_COMMUNITY)
Admission: AD | Admit: 2018-05-01 | Discharge: 2018-05-01 | Disposition: A | Discharge status: Home / Self Care | Payer: BLUE CROSS/BLUE SHIELD | Attending: Obstetrics & Gynecology | Admitting: Obstetrics & Gynecology

## 2018-05-01 DIAGNOSIS — R5381 Other malaise: Secondary | ICD-10-CM | POA: Diagnosis not present

## 2018-05-01 DIAGNOSIS — O99332 Smoking (tobacco) complicating pregnancy, second trimester: Secondary | ICD-10-CM | POA: Insufficient documentation

## 2018-05-01 DIAGNOSIS — Z3A16 16 weeks gestation of pregnancy: Secondary | ICD-10-CM

## 2018-05-01 DIAGNOSIS — O219 Vomiting of pregnancy, unspecified: Secondary | ICD-10-CM | POA: Insufficient documentation

## 2018-05-01 DIAGNOSIS — F329 Major depressive disorder, single episode, unspecified: Secondary | ICD-10-CM | POA: Diagnosis not present

## 2018-05-01 DIAGNOSIS — O99342 Other mental disorders complicating pregnancy, second trimester: Secondary | ICD-10-CM | POA: Diagnosis not present

## 2018-05-01 DIAGNOSIS — R110 Nausea: Secondary | ICD-10-CM | POA: Diagnosis not present

## 2018-05-01 DIAGNOSIS — F17200 Nicotine dependence, unspecified, uncomplicated: Secondary | ICD-10-CM | POA: Insufficient documentation

## 2018-05-01 LAB — COMPREHENSIVE METABOLIC PANEL
ALT: 12 U/L (ref 0–44)
AST: 24 U/L (ref 15–41)
Albumin: 3.1 g/dL — ABNORMAL LOW (ref 3.5–5.0)
Alkaline Phosphatase: 46 U/L (ref 38–126)
Anion gap: 8 (ref 5–15)
BUN: 6 mg/dL (ref 6–20)
CO2: 22 mmol/L (ref 22–32)
Calcium: 8 mg/dL — ABNORMAL LOW (ref 8.9–10.3)
Chloride: 103 mmol/L (ref 98–111)
Creatinine, Ser: 0.58 mg/dL (ref 0.44–1.00)
GFR calc Af Amer: >60 mL/min (ref >60)
GFR calc non Af Amer: >60 mL/min (ref >60)
Glucose, Bld: 113 mg/dL — ABNORMAL HIGH (ref 70–99)
Potassium: 3.2 mmol/L — ABNORMAL LOW (ref 3.5–5.1)
Sodium: 133 mmol/L — ABNORMAL LOW (ref 135–145)
Total Bilirubin: 0.6 mg/dL (ref 0.3–1.2)
Total Protein: 6.2 g/dL — ABNORMAL LOW (ref 6.5–8.1)

## 2018-05-01 LAB — CBC
HCT: 34.6 % — ABNORMAL LOW (ref 36.0–46.0)
Hemoglobin: 11.7 g/dL — ABNORMAL LOW (ref 12.0–15.0)
MCH: 30.1 pg (ref 26.0–34.0)
MCHC: 33.8 g/dL (ref 30.0–36.0)
MCV: 88.9 fL (ref 80.0–100.0)
Platelets: 202 10*3/uL (ref 150–400)
RBC: 3.89 MIL/uL (ref 3.87–5.11)
RDW: 13.2 % (ref 11.5–15.5)
WBC: 10.5 10*3/uL (ref 4.0–10.5)
nRBC: 0 % (ref 0.0–0.2)

## 2018-05-01 MED ORDER — M.V.I. ADULT IV INJ
Freq: Once | INTRAVENOUS | Status: DC
  Filled 2018-05-01: qty 10

## 2018-05-01 MED ORDER — GLYCOPYRROLATE 1 MG PO TABS: 1.0000 mg | ORAL_TABLET | Freq: Two times a day (BID) | ORAL | 0 refills | Status: DC

## 2018-05-01 MED ORDER — GLYCOPYRROLATE 0.2 MG/ML IJ SOLN
0.1000 mg | Freq: Once | INTRAMUSCULAR | Status: AC
  Administered 2018-05-01: 06:00:00 0.1 mg via INTRAVENOUS
  Filled 2018-05-01: qty 1

## 2018-05-01 MED ORDER — FAMOTIDINE IN NACL 20-0.9 MG/50ML-% IV SOLN
20.0000 mg | Freq: Once | INTRAVENOUS | Status: AC
  Administered 2018-05-01: 20 mg via INTRAVENOUS
  Filled 2018-05-01 (×2): qty 50

## 2018-05-01 MED ORDER — ONDANSETRON HCL 4 MG/2ML IJ SOLN
4.0000 mg | Freq: Once | INTRAMUSCULAR | Status: AC
  Administered 2018-05-01: 4 mg via INTRAVENOUS
  Filled 2018-05-01: qty 2

## 2018-05-01 MED ORDER — FAMOTIDINE 20 MG PO TABS: 20.0000 mg | ORAL_TABLET | Freq: Two times a day (BID) | ORAL | 0 refills | Status: DC

## 2018-05-01 MED ORDER — SODIUM CHLORIDE 0.9 % IV BOLUS
1000.0000 mL | Freq: Once | INTRAVENOUS | Status: AC
  Administered 2018-05-01: 1000 mL via INTRAVENOUS

## 2018-05-01 MED ORDER — PROMETHAZINE HCL 25 MG/ML IJ SOLN
25.0000 mg | Freq: Once | INTRAMUSCULAR | Status: AC
  Administered 2018-05-01: 04:00:00 25 mg via INTRAVENOUS
  Filled 2018-05-01: qty 1

## 2018-05-01 MED ORDER — METOCLOPRAMIDE HCL 5 MG/ML IJ SOLN: 10.0000 mg | Freq: Once | INTRAMUSCULAR | Status: DC

## 2018-05-01 NOTE — ED Provider Notes (Signed)
MSE was initiated and I personally evaluated the patient and placed orders (if any) at  4:06 AM on May 01, 2018.   23 y.o. G2P1001 at [redacted]w[redacted]d presents to the ED for persistent N/V in pregnancy. She was seen at [facility] yesterday for same. Has been vomiting yellow emesis despite use of promethazine and zofran. Feels diaphoretic when she has prolonged vomiting. No fevers, congestion, cough, SOB, syncope.   Patient actively dry heaving. Increasingly tachycardic when vomiting. Will give 1 dose of 25mg  IV Phenergan. Plan for transport to [facility] for ongoing care.  The patient appears stable so that the remainder of the MSE may be completed by another provider.   Vitals:   05/01/18 0349 05/01/18 0358 05/01/18 0400 05/01/18 0403  BP: 132/76     Pulse: 79  (!) 117   Resp: (!) 22     Temp: 98.3 F (36.8 C)   98.8 F (37.1 C)  TempSrc: Oral   Oral  SpO2: 100%  100%   Weight:  64.9 kg    Height:  5\' 7"  (1.702 m)        Antony Madura, PA-C 05/01/18 0409    Derwood Kaplan, MD 05/08/18 2979

## 2018-05-01 NOTE — MAU Provider Note (Signed)
History     CSN: [id number]  Arrival date and time: 05/01/18 8756   First Provider Initiated Contact with Patient 05/01/18 [phone]      Chief Complaint  Patient presents with  . Nausea   HPI Crystal Walters is a 23 y.o. G2P1001 at [redacted]w[redacted]d who presents with nausea and vomiting. She was seen in MAU less than 24 hours ago with the same complaints. She reports vomiting more times than she can count. She denies any pain. Denies vaginal bleeding or discharge.   OB History    Gravida  2   Para  1   Term  1   Preterm      AB      Living  1     SAB      TAB      Ectopic      Multiple  0   Live Births  1           Past Medical History:  Diagnosis Date  . Chlamydia   . Depression   . Seizures (HCC)    childhood  . Vaginal Pap smear, abnormal     Past Surgical History:  Procedure Laterality Date  . NO PAST SURGERIES      Family History  Problem Relation Age of Onset  . Hypertension Mother     Social History   Tobacco Use  . Smoking status: Current Every Day Smoker  . Smokeless tobacco: Never Used  Substance Use Topics  . Alcohol use: No  . Drug use: Yes    Types: Marijuana    Comment: marijuana- stopped x 10 dys    Allergies: No Known Allergies  Medications Prior to Admission  Medication Sig Dispense Refill Last Dose  . ondansetron (ZOFRAN) 4 MG tablet Take 1 tablet (4 mg total) by mouth every 8 (eight) hours as needed for up to 30 days for nausea or vomiting. 30 tablet 0   . Prenatal Vit-Fe Fumarate-FA (PRENATAL MULTIVITAMIN) TABS tablet Take 1 tablet by mouth daily at 12 noon.   04/29/2018 at Unknown time  . promethazine (PHENERGAN) 25 MG tablet Take 1 tablet (25 mg total) by mouth every 6 (six) hours as needed for nausea or vomiting. 30 tablet 0     Review of Systems  Constitutional: Negative.  Negative for fatigue and fever.  HENT: Negative.   Respiratory: Negative.  Negative for shortness of breath.   Cardiovascular: Negative.  Negative for  chest pain.  Gastrointestinal: Positive for nausea and vomiting. Negative for abdominal pain, constipation and diarrhea.  Genitourinary: Negative.  Negative for dysuria.  Neurological: Negative.  Negative for dizziness and headaches.   Physical Exam   Blood pressure (!) 120/59, pulse 76, temperature 98.9 F (37.2 C), temperature source Oral, resp. rate 18, height 5\' 7"  (1.702 m), weight 64.9 kg, last menstrual period 01/07/2018, SpO2 100 %, unknown if currently breastfeeding.  Physical Exam  Nursing note and vitals reviewed. Constitutional: She is oriented to person, place, and time. She appears well-developed and well-nourished. No distress.  HENT:  Head: Normocephalic.  Eyes: Pupils are equal, round, and reactive to light.  Cardiovascular: Normal rate, regular rhythm and normal heart sounds.  Respiratory: Effort normal and breath sounds normal. No respiratory distress.  GI: Soft. Bowel sounds are normal. She exhibits no distension. There is no abdominal tenderness.  Neurological: She is alert and oriented to person, place, and time.  Skin: Skin is warm and dry.  Psychiatric: She has a normal mood and affect.  Her behavior is normal. Judgment and thought content normal.   FHT: 162 bpm MAU Course  Procedures Results for orders placed or performed during the hospital encounter of 05/01/18 (from the past 24 hour(s))  CBC     Status: Abnormal   Collection Time: 05/01/18  5:50 AM  Result Value Ref Range   WBC 10.5 4.0 - 10.5 K/uL   RBC 3.89 3.87 - 5.11 MIL/uL   Hemoglobin 11.7 (L) 12.0 - 15.0 g/dL   HCT 72.8 (L) 20.6 - 01.5 %   MCV 88.9 80.0 - 100.0 fL   MCH 30.1 26.0 - 34.0 pg   MCHC 33.8 30.0 - 36.0 g/dL   RDW 61.5 37.9 - 43.2 %   Platelets 202 150 - 400 K/uL   nRBC 0.0 0.0 - 0.2 %  Comprehensive metabolic panel     Status: Abnormal   Collection Time: 05/01/18  5:50 AM  Result Value Ref Range   Sodium 133 (L) 135 - 145 mmol/L   Potassium 3.2 (L) 3.5 - 5.1 mmol/L   Chloride  103 98 - 111 mmol/L   CO2 22 22 - 32 mmol/L   Glucose, Bld 113 (H) 70 - 99 mg/dL   BUN 6 6 - 20 mg/dL   Creatinine, Ser 7.61 0.44 - 1.00 mg/dL   Calcium 8.0 (L) 8.9 - 10.3 mg/dL   Total Protein 6.2 (L) 6.5 - 8.1 g/dL   Albumin 3.1 (L) 3.5 - 5.0 g/dL   AST 24 15 - 41 U/L   ALT 12 0 - 44 U/L   Alkaline Phosphatase 46 38 - 126 U/L   Total Bilirubin 0.6 0.3 - 1.2 mg/dL   GFR calc non Af Amer >60 >60 mL/min   GFR calc Af Amer >60 >60 mL/min   Anion gap 8 5 - 15   MDM Patient is a poor historian, telling RN that she slept all day and woke up throwing up tonight and telling CNM that she never stopped vomiting after discharge. Patient reporting to RN that she took prescribed medication and telling CNM that she did not know where her RX was sent. Unable to tell CNM time of last doses of nausea medications.   Lengthy discussion with patient about importance of taking nausea medication on a schedule and not missing doses. Emphasized importance of picking up previously prescribed medication. Reviewed suppository use of phenergan tablets and expectation to not eliminate nausea and vomiting but to make it less than before.   UA- patient refusing to give sample for over 3 hours. She will get up to urinate but will not leave a sample  Patient came from ED with IV fluids and received 25mg  phenergan IV Robinol IV Zofran IV Pepcid IV  CBC, CMP- wnl  Patient no longer vomiting  Assessment and Plan   1. Nausea and vomiting during pregnancy prior to [redacted] weeks gestation   2. [redacted] weeks gestation of pregnancy    -Discharge home in stable condition -Rx for pepcid and robinul sent to patient's pharmacy. Strongly encouraged patient to pick up previously prescribed prescriptions and take them as instructed -Vomiting precautions discussed -Patient advised to follow-up with [facility] as scheduled for prenatal care -Patient may return to MAU as needed or if her condition were to change or worsen   Rolm Bookbinder CNM 05/01/2018, 5:09 AM

## 2018-05-01 NOTE — Discharge Instructions (Signed)
Safe Medications in Pregnancy   Acne: Benzoyl Peroxide Salicylic Acid  Backache/Headache: Tylenol: 2 regular strength every 4 hours OR              2 Extra strength every 6 hours  Colds/Coughs/Allergies: Benadryl (alcohol free) 25 mg every 6 hours as needed Breath right strips Claritin Cepacol throat lozenges Chloraseptic throat spray Cold-Eeze- up to three times per day Cough drops, alcohol free Flonase (by prescription only) Guaifenesin Mucinex Robitussin DM (plain only, alcohol free) Saline nasal spray/drops Sudafed (pseudoephedrine) & Actifed ** use only after [redacted] weeks gestation and if you do not have high blood pressure Tylenol Vicks Vaporub Zinc lozenges Zyrtec   Constipation: Colace Ducolax suppositories Fleet enema Glycerin suppositories Metamucil Milk of magnesia Miralax Senokot Smooth move tea  Diarrhea: Kaopectate Imodium A-D  *NO pepto Bismol  Hemorrhoids: Anusol Anusol HC Preparation H Tucks  Indigestion: Tums Maalox Mylanta Zantac  Pepcid  Insomnia: Benadryl (alcohol free) 25mg  every 6 hours as needed Tylenol PM Unisom, no Gelcaps  Leg Cramps: Tums MagGel  Nausea/Vomiting:  Bonine Dramamine Emetrol Ginger extract Sea bands Meclizine  Nausea medication to take during pregnancy:  Unisom (doxylamine succinate 25 mg tablets) Take one tablet daily at bedtime. If symptoms are not adequately controlled, the dose can be increased to a maximum recommended dose of two tablets daily (1/2 tablet in the morning, 1/2 tablet mid-afternoon and one at bedtime). Vitamin B6 100mg  tablets. Take one tablet twice a day (up to 200 mg per day).  Skin Rashes: Aveeno products Benadryl cream or 25mg  every 6 hours as needed Calamine Lotion 1% cortisone cream  Yeast infection: Gyne-lotrimin 7 Monistat 7   **If taking multiple medications, please check labels to avoid duplicating the same active ingredients **take medication as directed on  the label ** Do not exceed 4000 mg of tylenol in 24 hours **Do not take medications that contain aspirin or ibuprofen      Morning Sickness  Morning sickness is when a woman feels nauseous during pregnancy. This nauseous feeling may or may not come with vomiting. It often occurs in the morning, but it can be a problem at any time of day. Morning sickness is most common during the first trimester. In some cases, it may continue throughout pregnancy. Although morning sickness is unpleasant, it is usually harmless unless the woman develops severe and continual vomiting (hyperemesis gravidarum), a condition that requires more intense treatment. What are the causes? The exact cause of this condition is not known, but it seems to be related to normal hormonal changes that occur in pregnancy. What increases the risk? You are more likely to develop this condition if:  You experienced nausea or vomiting before your pregnancy.  You had morning sickness during a previous pregnancy.  You are pregnant with more than one baby, such as twins. What are the signs or symptoms? Symptoms of this condition include:  Nausea.  Vomiting. How is this diagnosed? This condition is usually diagnosed based on your signs and symptoms. How is this treated? In many cases, treatment is not needed for this condition. Making some changes to what you eat may help to control symptoms. Your health care provider may also prescribe or recommend:  Vitamin B6 supplements.  Anti-nausea medicines.  Ginger. Follow these instructions at home: Medicines  Take over-the-counter and prescription medicines only as told by your health care provider. Do not use any prescription, over-the-counter, or herbal medicines for morning sickness without first talking with your health care provider.  Taking multivitamins before getting pregnant can prevent or decrease the severity of morning sickness in most women. Eating and  drinking  Eat a piece of dry toast or crackers before getting out of bed in the morning.  Eat 5 or 6 small meals a day.  Eat dry and bland foods, such as rice or a baked potato. Foods that are high in carbohydrates are often helpful.  Avoid greasy, fatty, and spicy foods.  Have someone cook for you if the smell of any food causes nausea and vomiting.  If you feel nauseous after taking prenatal vitamins, take the vitamins at night or with a snack.  Snack on protein foods between meals if you are hungry. Nuts, yogurt, and cheese are good options.  Drink fluids throughout the day.  Try ginger ale made with real ginger, ginger tea made from fresh grated ginger, or ginger candies. General instructions  Do not use any products that contain nicotine or tobacco, such as cigarettes and e-cigarettes. If you need help quitting, ask your health care provider.  Get an air purifier to keep the air in your house free of odors.  Get plenty of fresh air.  Try to avoid odors that trigger your nausea.  Consider trying these methods to help relieve symptoms: ? Wearing an acupressure wristband. These wristbands are often worn for seasickness. ? Acupuncture. Contact a health care provider if:  Your home remedies are not working and you need medicine.  You feel dizzy or light-headed.  You are losing weight. Get help right away if:  You have persistent and uncontrolled nausea and vomiting.  You faint.  You have severe pain in your abdomen. Summary  Morning sickness is when a woman feels nauseous during pregnancy. This nauseous feeling may or may not come with vomiting.  Morning sickness is most common during the first trimester.  It often occurs in the morning, but it can be a problem at any time of day.  In many cases, treatment is not needed for this condition. Making some changes to what you eat may help to control symptoms. This information is not intended to replace advice given  to you by your health care provider. Make sure you discuss any questions you have with your health care provider. Document Released: 03/04/2006 Document Revised: 02/14/2016 Document Reviewed: 02/14/2016 Elsevier Interactive Patient Education  2019 [employer].

## 2018-05-01 NOTE — ED Triage Notes (Signed)
Pt. BIB EMS c/o worsening NV starting Saturday after being seen at [facility] hospital. Pt. States promethazine and zofran have not been working. Pt. Is currently [redacted] weeks pregnant.

## 2018-05-01 NOTE — MAU Note (Addendum)
Presents with consistent nausea and vomiting. Pt tried Phenergan and Zofran without improvement. No LOF or vag bldg noted. Pt c/o chest tightness; sometime this morning prior to EMS taking her to [facility] ED. Denies chest pain, heartburn, or SOB.    Adah Perl RN

## 2018-05-02 ENCOUNTER — Other Ambulatory Visit: Payer: Self-pay

## 2018-05-02 ENCOUNTER — Inpatient Hospital Stay (HOSPITAL_COMMUNITY)
Admission: EM | Admit: 2018-05-02 | Discharge: 2018-05-02 | Disposition: A | Discharge status: Home / Self Care | Payer: BLUE CROSS/BLUE SHIELD | Attending: Obstetrics & Gynecology | Admitting: Obstetrics & Gynecology

## 2018-05-02 ENCOUNTER — Encounter (HOSPITAL_COMMUNITY): Payer: Self-pay | Admitting: *Deleted

## 2018-05-02 DIAGNOSIS — O219 Vomiting of pregnancy, unspecified: Secondary | ICD-10-CM

## 2018-05-02 DIAGNOSIS — O99332 Smoking (tobacco) complicating pregnancy, second trimester: Secondary | ICD-10-CM | POA: Diagnosis not present

## 2018-05-02 DIAGNOSIS — F329 Major depressive disorder, single episode, unspecified: Secondary | ICD-10-CM | POA: Insufficient documentation

## 2018-05-02 DIAGNOSIS — O210 Mild hyperemesis gravidarum: Secondary | ICD-10-CM | POA: Insufficient documentation

## 2018-05-02 DIAGNOSIS — Z3A16 16 weeks gestation of pregnancy: Secondary | ICD-10-CM | POA: Diagnosis not present

## 2018-05-02 DIAGNOSIS — O99342 Other mental disorders complicating pregnancy, second trimester: Secondary | ICD-10-CM | POA: Insufficient documentation

## 2018-05-02 DIAGNOSIS — F17200 Nicotine dependence, unspecified, uncomplicated: Secondary | ICD-10-CM | POA: Insufficient documentation

## 2018-05-02 LAB — URINALYSIS, ROUTINE W REFLEX MICROSCOPIC
Bilirubin Urine: NEGATIVE
Glucose, UA: NEGATIVE mg/dL
Ketones, ur: 80 mg/dL — AB
Leukocytes,Ua: NEGATIVE
Nitrite: NEGATIVE
Protein, ur: NEGATIVE mg/dL
Specific Gravity, Urine: 1.006 (ref 1.005–1.030)
pH: 7 (ref 5.0–8.0)

## 2018-05-02 LAB — COMPREHENSIVE METABOLIC PANEL
ALT: 13 U/L (ref 0–44)
AST: 24 U/L (ref 15–41)
Albumin: 3.6 g/dL (ref 3.5–5.0)
Alkaline Phosphatase: 49 U/L (ref 38–126)
Anion gap: 10 (ref 5–15)
BUN: 6 mg/dL (ref 6–20)
CO2: 24 mmol/L (ref 22–32)
Calcium: 8.9 mg/dL (ref 8.9–10.3)
Chloride: 101 mmol/L (ref 98–111)
Creatinine, Ser: 0.75 mg/dL (ref 0.44–1.00)
GFR calc Af Amer: >60 mL/min (ref >60)
GFR calc non Af Amer: >60 mL/min (ref >60)
Glucose, Bld: 91 mg/dL (ref 70–99)
Potassium: 3.9 mmol/L (ref 3.5–5.1)
Sodium: 135 mmol/L (ref 135–145)
Total Bilirubin: 0.6 mg/dL (ref 0.3–1.2)
Total Protein: 7.1 g/dL (ref 6.5–8.1)

## 2018-05-02 LAB — CBC
HCT: 37.6 % (ref 36.0–46.0)
Hemoglobin: 12.5 g/dL (ref 12.0–15.0)
MCH: 30.3 pg (ref 26.0–34.0)
MCHC: 33.2 g/dL (ref 30.0–36.0)
MCV: 91.3 fL (ref 80.0–100.0)
Platelets: 226 10*3/uL (ref 150–400)
RBC: 4.12 MIL/uL (ref 3.87–5.11)
RDW: 13.2 % (ref 11.5–15.5)
WBC: 7.9 10*3/uL (ref 4.0–10.5)
nRBC: 0 % (ref 0.0–0.2)

## 2018-05-02 LAB — I-STAT BETA HCG BLOOD, ED (MC, WL, AP ONLY): I-stat hCG, quantitative: >2000 m[IU]/mL — ABNORMAL HIGH (ref <5)

## 2018-05-02 LAB — LIPASE, BLOOD: Lipase: 24 U/L (ref 11–51)

## 2018-05-02 MED ORDER — METOCLOPRAMIDE HCL 5 MG/ML IJ SOLN
10.0000 mg | Freq: Once | INTRAMUSCULAR | Status: AC
  Administered 2018-05-02: 17:00:00 10 mg via INTRAVENOUS
  Filled 2018-05-02: qty 2

## 2018-05-02 MED ORDER — METOCLOPRAMIDE HCL 10 MG PO TABS: 10.0000 mg | ORAL_TABLET | Freq: Four times a day (QID) | ORAL | 1 refills | Status: DC | PRN

## 2018-05-02 MED ORDER — SCOPOLAMINE 1 MG/3DAYS TD PT72: 1.0000 | MEDICATED_PATCH | TRANSDERMAL | 12 refills | Status: DC

## 2018-05-02 MED ORDER — FAMOTIDINE 20 MG IN NS 100 ML IVPB
20.0000 mg | Freq: Once | INTRAVENOUS | Status: AC
  Administered 2018-05-02: 20 mg via INTRAVENOUS
  Filled 2018-05-02: qty 100

## 2018-05-02 MED ORDER — SODIUM CHLORIDE 0.9% FLUSH: 3.0000 mL | Freq: Once | INTRAVENOUS | Status: DC

## 2018-05-02 MED ORDER — SCOPOLAMINE 1 MG/3DAYS TD PT72
1.0000 | MEDICATED_PATCH | TRANSDERMAL | Status: DC
  Administered 2018-05-02: 16:00:00 1.5 mg via TRANSDERMAL
  Filled 2018-05-02: qty 1

## 2018-05-02 MED ORDER — LACTATED RINGERS IV BOLUS
1000.0000 mL | Freq: Once | INTRAVENOUS | Status: AC
  Administered 2018-05-02: 17:00:00 1000 mL via INTRAVENOUS

## 2018-05-02 NOTE — ED Triage Notes (Signed)
Pt in c/o vomiting x 3 days with [redacted] wks pregnant, pt seen for the same x 3 days, pt denies diarrhea, A&O x4, c/o no appetite

## 2018-05-02 NOTE — MAU Provider Note (Signed)
History     CSN: [id number]  Arrival date and time: 05/02/18 1305   First Provider Initiated Contact with Patient 05/02/18 1551      Chief Complaint  Patient presents with  . Emesis   G2P1001 @16 .3 wks presenting with N/V. This is her 3rd ED visit in the last 2 days. Reports emesis q30 min. Cannot tolerate anything po. She has tried Zofran and Phenergan. Last took Phenergan this am but threw up after. No fevers. No diarrhea. No abd pain or VB. Reports an episode yesterday when the ambulance arrived at her house in which she had chest tightness, trouble breathing, tingling in her arms, and her "hands curved in". It happened once again while she was at the hospital. Denies hx of anxiety or panic attacks.   OB History    Gravida  2   Para  1   Term  1   Preterm      AB      Living  1     SAB      TAB      Ectopic      Multiple  0   Live Births  1           Past Medical History:  Diagnosis Date  . Chlamydia   . Depression   . Seizures (HCC)    childhood  . Vaginal Pap smear, abnormal     Past Surgical History:  Procedure Laterality Date  . NO PAST SURGERIES      Family History  Problem Relation Age of Onset  . Hypertension Mother     Social History   Tobacco Use  . Smoking status: Current Every Day Smoker  . Smokeless tobacco: Never Used  Substance Use Topics  . Alcohol use: No  . Drug use: Yes    Types: Marijuana    Comment: marijuana- stopped x 10 dys    Allergies: No Known Allergies  Medications Prior to Admission  Medication Sig Dispense Refill Last Dose  . famotidine (PEPCID) 20 MG tablet Take 1 tablet (20 mg total) by mouth 2 (two) times daily. 30 tablet 0 05/02/2018 at Unknown time  . glycopyrrolate (ROBINUL) 1 MG tablet Take 1 tablet (1 mg total) by mouth 2 (two) times daily for 30 days. 60 tablet 0 05/02/2018 at Unknown time  . Prenatal Vit-Fe Fumarate-FA (PRENATAL MULTIVITAMIN) TABS tablet Take 1 tablet by mouth daily at 12 noon.    05/02/2018 at Unknown time  . promethazine (PHENERGAN) 25 MG tablet Take 1 tablet (25 mg total) by mouth every 6 (six) hours as needed for nausea or vomiting. 30 tablet 0 05/02/2018 at Unknown time  . ondansetron (ZOFRAN) 4 MG tablet Take 1 tablet (4 mg total) by mouth every 8 (eight) hours as needed for up to 30 days for nausea or vomiting. 30 tablet 0     Review of Systems  Constitutional: Negative for fever.  Gastrointestinal: Positive for constipation, nausea and vomiting. Negative for diarrhea.  Genitourinary: Negative for vaginal bleeding.  Neurological: Positive for light-headedness. Negative for syncope.   Physical Exam   Blood pressure 135/61, pulse 65, temperature 98.5 F (36.9 C), resp. rate 16, height 5\' 7"  (1.702 m), weight 60.8 kg, last menstrual period 01/07/2018, SpO2 100 %, unknown if currently breastfeeding.  Physical Exam  Constitutional: She is oriented to person, place, and time. She appears well-developed and well-nourished. No distress.  HENT:  Head: Normocephalic and atraumatic.  Neck: Normal range of motion.  Cardiovascular: Normal  rate.  Respiratory: Effort normal.  Musculoskeletal: Normal range of motion.  Neurological: She is alert and oriented to person, place, and time.  Psychiatric: She has a normal mood and affect.  FHT: 164  Results for orders placed or performed during the hospital encounter of 05/02/18 (from the past 24 hour(s))  Lipase, blood     Status: None   Collection Time: 05/02/18  1:19 PM  Result Value Ref Range   Lipase 24 11 - 51 U/L  Comprehensive metabolic panel     Status: None   Collection Time: 05/02/18  1:19 PM  Result Value Ref Range   Sodium 135 135 - 145 mmol/L   Potassium 3.9 3.5 - 5.1 mmol/L   Chloride 101 98 - 111 mmol/L   CO2 24 22 - 32 mmol/L   Glucose, Bld 91 70 - 99 mg/dL   BUN 6 6 - 20 mg/dL   Creatinine, Ser 0.98 0.44 - 1.00 mg/dL   Calcium 8.9 8.9 - 11.9 mg/dL   Total Protein 7.1 6.5 - 8.1 g/dL   Albumin 3.6 3.5  - 5.0 g/dL   AST 24 15 - 41 U/L   ALT 13 0 - 44 U/L   Alkaline Phosphatase 49 38 - 126 U/L   Total Bilirubin 0.6 0.3 - 1.2 mg/dL   GFR calc non Af Amer >60 >60 mL/min   GFR calc Af Amer >60 >60 mL/min   Anion gap 10 5 - 15  CBC     Status: None   Collection Time: 05/02/18  1:19 PM  Result Value Ref Range   WBC 7.9 4.0 - 10.5 K/uL   RBC 4.12 3.87 - 5.11 MIL/uL   Hemoglobin 12.5 12.0 - 15.0 g/dL   HCT 14.7 82.9 - 56.2 %   MCV 91.3 80.0 - 100.0 fL   MCH 30.3 26.0 - 34.0 pg   MCHC 33.2 30.0 - 36.0 g/dL   RDW 13.0 86.5 - 78.4 %   Platelets 226 150 - 400 K/uL   nRBC 0.0 0.0 - 0.2 %  I-Stat beta hCG blood, ED     Status: Abnormal   Collection Time: 05/02/18  2:16 PM  Result Value Ref Range   I-stat hCG, quantitative >2,000.0 (H) <5 mIU/mL   Comment 3          Urinalysis, Routine w reflex microscopic     Status: Abnormal   Collection Time: 05/02/18  3:45 PM  Result Value Ref Range   Color, Urine YELLOW YELLOW   APPearance CLEAR CLEAR   Specific Gravity, Urine 1.006 1.005 - 1.030   pH 7.0 5.0 - 8.0   Glucose, UA NEGATIVE NEGATIVE mg/dL   Hgb urine dipstick SMALL (A) NEGATIVE   Bilirubin Urine NEGATIVE NEGATIVE   Ketones, ur 80 (A) NEGATIVE mg/dL   Protein, ur NEGATIVE NEGATIVE mg/dL   Nitrite NEGATIVE NEGATIVE   Leukocytes,Ua NEGATIVE NEGATIVE   RBC / HPF 0-5 0 - 5 RBC/hpf   WBC, UA 0-5 0 - 5 WBC/hpf   Bacteria, UA RARE (A) NONE SEEN   Squamous Epithelial / LPF 0-5 0 - 5   MAU Course  Procedures Orders Placed This Encounter  Procedures  . Urine culture    Standing Status:   Standing    Number of Occurrences:   1  . Lipase, blood    Standing Status:   Standing    Number of Occurrences:   1  . Comprehensive metabolic panel    Standing Status:   Standing  Number of Occurrences:   1  . CBC    Standing Status:   Standing    Number of Occurrences:   1  . Urinalysis, Routine w reflex microscopic    Standing Status:   Standing    Number of Occurrences:   1  . Diet NPO  time specified    Standing Status:   Standing    Number of Occurrences:   1  . Saline Lock IV, Maintain IV access    Standing Status:   Standing    Number of Occurrences:   1  . Discharge patient    Order Specific Question:   Discharge disposition    Answer:   01-Home or Self Care [1]    Order Specific Question:   Discharge patient date    Answer:   05/02/2018   Meds ordered this encounter  Medications  . sodium chloride flush (NS) 0.9 % injection 3 mL  . scopolamine (TRANSDERM-SCOP) 1 MG/3DAYS 1.5 mg  . metoCLOPramide (REGLAN) injection 10 mg  . lactated ringers bolus 1,000 mL  . famotidine (PEPCID) IVPB 20 mg in NS 100 mL IVPB  . scopolamine (TRANSDERM-SCOP) 1 MG/3DAYS    Sig: Place 1 patch (1.5 mg total) onto the skin every 3 (three) days.    Dispense:  10 patch    Refill:  12    Order Specific Question:   Supervising Provider    Answer:   Jaynie Collins A [[id number]]  . metoCLOPramide (REGLAN) 10 MG tablet    Sig: Take 1 tablet (10 mg total) by mouth every 6 (six) hours as needed for nausea.    Dispense:  30 tablet    Refill:  1    Order Specific Question:   Supervising Provider    Answer:   Jaynie Collins A [[id number]]   MDM Labs reviewed. Feeling much better after meds. No further emesis. Tolerating po food and fluids. Recommend regimen of Reglan, Scopolamine, and Pepcid. Stable for discharge home.    Assessment and Plan   1. Vomiting during pregnancy   2. [redacted] weeks gestation of pregnancy   3. Morning sickness    Discharge home Follow up at [facility] as scheduled in 2 days Rx Reglan Rx Scopolamine  Allergies as of 05/02/2018   No Known Allergies     Medication List    STOP taking these medications   glycopyrrolate 1 MG tablet Commonly known as:  Robinul   ondansetron 4 MG tablet Commonly known as:  Zofran   promethazine 25 MG tablet Commonly known as:  PHENERGAN     TAKE these medications   famotidine 20 MG tablet Commonly known as:  PEPCID Take 1 tablet (20 mg  total) by mouth 2 (two) times daily.   metoCLOPramide 10 MG tablet Commonly known as:  REGLAN Take 1 tablet (10 mg total) by mouth every 6 (six) hours as needed for nausea.   prenatal multivitamin Tabs tablet Take 1 tablet by mouth daily at 12 noon.   scopolamine 1 MG/3DAYS Commonly known as:  TRANSDERM-SCOP Place 1 patch (1.5 mg total) onto the skin every 3 (three) days. Start taking on:  May 05, 2018       Donette Larry, [place] 05/02/2018, 7:00 PM

## 2018-05-02 NOTE — Discharge Instructions (Signed)
Morning Sickness  Morning sickness is when you feel sick to your stomach (nauseous) during pregnancy. You may feel sick to your stomach and throw up (vomit). You may feel sick in the morning, but you can feel this way at any time of day. Some women feel very sick to their stomach and cannot stop throwing up (hyperemesis gravidarum). Follow these instructions at home: Medicines  Take over-the-counter and prescription medicines only as told by your doctor. Do not take any medicines until you talk with your doctor about them first.  Taking multivitamins before getting pregnant can stop or lessen the harshness of morning sickness. Eating and drinking  Eat dry toast or crackers before getting out of bed.  Eat 5 or 6 small meals a day.  Eat dry and bland foods like rice and baked potatoes.  Do not eat greasy, fatty, or spicy foods.  Have someone cook for you if the smell of food causes you to feel sick or throw up.  If you feel sick to your stomach after taking prenatal vitamins, take them at night or with a snack.  Eat protein when you need a snack. Nuts, yogurt, and cheese are good choices.  Drink fluids throughout the day.  Try ginger ale made with real ginger, ginger tea made from fresh grated ginger, or ginger candies. General instructions  Do not use any products that have nicotine or tobacco in them, such as cigarettes and e-cigarettes. If you need help quitting, ask your doctor.  Use an air purifier to keep the air in your house free of smells.  Get lots of fresh air.  Try to avoid smells that make you feel sick.  Try: ? Wearing a bracelet that is used for seasickness (acupressure wristband). ? Going to a doctor who puts thin needles into certain body points (acupuncture) to improve how you feel. Contact a doctor if:  You need medicine to feel better.  You feel dizzy or light-headed.  You are losing weight. Get help right away if:  You feel very sick to your  stomach and cannot stop throwing up.  You pass out (faint).  You have very bad pain in your belly. Summary  Morning sickness is when you feel sick to your stomach (nauseous) during pregnancy.  You may feel sick in the morning, but you can feel this way at any time of day.  Making some changes to what you eat may help your symptoms go away. This information is not intended to replace advice given to you by your health care provider. Make sure you discuss any questions you have with your health care provider. Document Released: 02/19/2004 Document Revised: 02/12/2016 Document Reviewed: 02/12/2016 Elsevier Interactive Patient Education  2019 [employer]   Hyperemesis Gravidarum Hyperemesis gravidarum is a severe form of nausea and vomiting that happens during pregnancy. Hyperemesis is worse than morning sickness. It may cause you to have nausea or vomiting all day for many days. It may keep you from eating and drinking enough food and liquids, which can lead to dehydration, malnutrition, and weight loss. Hyperemesis usually occurs during the first half (the first 20 weeks) of pregnancy. It often goes away once a woman is in her second half of pregnancy. However, sometimes hyperemesis continues through an entire pregnancy. What are the causes? The cause of this condition is not known. It may be related to changes in chemicals (hormones) in the body during pregnancy, such as the high level of pregnancy hormone (human chorionic gonadotropin) or  the increase in the female sex hormone (estrogen). What are the signs or symptoms? Symptoms of this condition include:  Nausea that does not go away.  Vomiting that does not allow you to keep any food down.  Weight loss.  Body fluid loss (dehydration).  Having no desire to eat, or not liking food that you have previously enjoyed. How is this diagnosed? This condition may be diagnosed based on:  A physical exam.  Your medical history.  Your  symptoms.  Blood tests.  Urine tests. How is this treated? This condition is managed by controlling symptoms. This may include:  Following an eating plan. This can help lessen nausea and vomiting.  Taking prescription medicines. An eating plan and medicines are often used together to help control symptoms. If medicines do not help relieve nausea and vomiting, you may need to receive fluids through an IV at the hospital. Follow these instructions at home: Eating and drinking   Avoid the following: ? Drinking fluids with meals. Try not to drink anything during the 30 minutes before and after your meals. ? Drinking more than 1 cup of fluid at a time. ? Eating foods that trigger your symptoms. These may include spicy foods, coffee, high-fat foods, very sweet foods, and acidic foods. ? Skipping meals. Nausea can be more intense on an empty stomach. If you cannot tolerate food, do not force it. Try sucking on ice chips or other frozen items and make up for missed calories later. ? Lying down within 2 hours after eating. ? Being exposed to environmental triggers. These may include food smells, smoky rooms, closed spaces, rooms with strong smells, warm or humid places, overly loud and noisy rooms, and rooms with motion or flickering lights. Try eating meals in a well-ventilated area that is free of strong smells. ? Quick and sudden changes in your movement. ? Taking iron pills and multivitamins that contain iron. If you take prescription iron pills, do not stop taking them unless your health care provider approves. ? Preparing food. The smell of food can spoil your appetite or trigger nausea.  To help relieve your symptoms: ? Listen to your body. Everyone is different and has different preferences. Find what works best for you. ? Eat and drink slowly. ? Eat 5-6 small meals daily instead of 3 large meals. Eating small meals and snacks can help you avoid an empty stomach. ? In the morning,  before getting out of bed, eat a couple of crackers to avoid moving around on an empty stomach. ? Try eating starchy foods as these are usually tolerated well. Examples include cereal, toast, bread, potatoes, pasta, rice, and pretzels. ? Include at least 1 serving of protein with your meals and snacks. Protein options include lean meats, poultry, seafood, beans, nuts, nut butters, eggs, cheese, and yogurt. ? Try eating a protein-rich snack before bed. Examples of a protein-rick snack include cheese and crackers or a peanut butter sandwich made with 1 slice of whole-wheat bread and 1 tsp (5 g) of peanut butter. ? Eat or suck on things that have ginger in them. It may help relieve nausea. Add  tsp ground ginger to hot tea or choose ginger tea. ? Try drinking 100% fruit juice or an electrolyte drink. An electrolyte drink contains sodium, potassium, and chloride. ? Drink fluids that are cold, clear, and carbonated or sour. Examples include lemonade, ginger ale, lemon-lime soda, ice water, and sparkling water. ? Brush your teeth or use a mouth rinse after meals. ?  Talk with your health care provider about starting a supplement of vitamin B6. General instructions  Take over-the-counter and prescription medicines only as told by your health care provider.  Follow instructions from your health care provider about eating or drinking restrictions.  Continue to take your prenatal vitamins as told by your health care provider. If you are having trouble taking your prenatal vitamins, talk with your health care provider about different options.  Keep all follow-up and pre-birth (prenatal) visits as told by your health care provider. This is important. Contact a health care provider if:  You have pain in your abdomen.  You have a severe headache.  You have vision problems.  You are losing weight.  You feel weak or dizzy. Get help right away if:  You cannot drink fluids without vomiting.  You vomit  blood.  You have constant nausea and vomiting.  You are very weak.  You faint.  You have a fever and your symptoms suddenly get worse. Summary  Hyperemesis gravidarum is a severe form of nausea and vomiting that happens during pregnancy.  Making some changes to your eating habits may help relieve nausea and vomiting.  This condition may be managed with medicine.  If medicines do not help relieve nausea and vomiting, you may need to receive fluids through an IV at the hospital. This information is not intended to replace advice given to you by your health care provider. Make sure you discuss any questions you have with your health care provider. Document Released: 01/11/2005 Document Revised: 01/31/2017 Document Reviewed: 09/10/2015 Elsevier Interactive Patient Education  2019 [employer].

## 2018-05-02 NOTE — MAU Note (Signed)
Pt presents to MAU from ED for N/V. Pt has been evaluated here in MAU the last two days and given medication but is not able to keep anything down.

## 2018-05-02 NOTE — ED Notes (Signed)
Pt attempted to provide urine sample but was unable to at this time.  She states she had just urinated prior to coming back to room.

## 2018-05-02 NOTE — ED Notes (Signed)
Pt aware that urine sample is needed, but is unable to provide one at this time 

## 2018-05-02 NOTE — ED Provider Notes (Signed)
MSE was initiated and I personally evaluated the patient and placed orders (if any) at  2:20 PM on May 02, 2018.  23 year old female who is currently at [redacted]wk gestation presents to ED for recurrent nausea and vomiting in pregnancy.  This is her third visit in the past 3 days for similar symptoms.  She was given a prescription for Phenergan, Pepcid and Robinul which she states she is taking with no improvement.  She is concerned because "they just keep discharging me."  She has had one prior pregnancy with no similar symptoms then. She denies fevers, shortness of breath, urinary symptoms or changes to bowel movements. She is asking if she can drink her Pedialyte. I have spoken to the MAU and will transport to [facility] for further management.  The patient appears stable so that the remainder of the MSE may be completed by another provider.   Dietrich Pates, PA-C 05/02/18 1423    Linwood Dibbles, MD 05/03/18 [phone]

## 2018-05-03 ENCOUNTER — Encounter (HOSPITAL_COMMUNITY): Payer: Self-pay | Admitting: Emergency Medicine

## 2018-05-03 ENCOUNTER — Other Ambulatory Visit: Payer: Self-pay

## 2018-05-03 ENCOUNTER — Emergency Department (HOSPITAL_COMMUNITY)
Admission: AD | Admit: 2018-05-03 | Discharge: 2018-05-04 | Disposition: A | Discharge status: Home / Self Care | Payer: BLUE CROSS/BLUE SHIELD | Attending: Emergency Medicine | Admitting: Emergency Medicine

## 2018-05-03 DIAGNOSIS — O26822 Pregnancy related peripheral neuritis, second trimester: Secondary | ICD-10-CM | POA: Insufficient documentation

## 2018-05-03 DIAGNOSIS — F17210 Nicotine dependence, cigarettes, uncomplicated: Secondary | ICD-10-CM | POA: Diagnosis not present

## 2018-05-03 DIAGNOSIS — O210 Mild hyperemesis gravidarum: Secondary | ICD-10-CM | POA: Diagnosis not present

## 2018-05-03 DIAGNOSIS — Z3A16 16 weeks gestation of pregnancy: Secondary | ICD-10-CM | POA: Insufficient documentation

## 2018-05-03 LAB — URINE CULTURE: Culture: <10000 — AB

## 2018-05-03 NOTE — MAU Note (Signed)
Pt here with c/o SOB and vomiting; [facility] called and confirmed that patient should be seen in the ED instead; ED charge RN called and given report. Pt transferred via [facility] by tech to ED.

## 2018-05-03 NOTE — ED Triage Notes (Signed)
C/o generalized abd pain and vomiting since Sunday.  States she is [redacted] wks pregnant and has been seen here multiple times this week for same.  Pt brought to ED from [facility].

## 2018-05-04 MED ORDER — DOXYLAMINE-PYRIDOXINE 10-10 MG PO TBEC: DELAYED_RELEASE_TABLET | ORAL | 0 refills | Status: DC

## 2018-05-04 NOTE — Discharge Instructions (Signed)
May take the doxylamine-pyridoxine (generic for Diclegis), as prescribed, for nausea/vomiting.  Discontinue any other nausea medications. After the trial of Diclegis, may choose whichever medication seemed to work the best. Follow-up with OB/GYN, as planned. Return to the emergency department for worsening abdominal pain, vaginal bleeding, fever, shortness of breath, chest pain, or any other major concerns.

## 2018-05-04 NOTE — ED Provider Notes (Signed)
[facility]   CSN: [id number] Arrival date & time: 05/03/18  2201    History   Chief Complaint Chief Complaint  Patient presents with  . Shortness of Breath  . Emesis    HPI Crystal Walters is a 23 y.o. female.     HPI   Crystal Walters is a 23 y.o. female, with a history of pregnancy at 4516 weeks gestation, G2P1, presenting to the ED with nausea and vomiting for the last 5 days.  She reports 5-10 episodes of nonbilious, nonbloody emesis per day. She also notes generalized abdominal discomfort, described as a soreness, arose after a few days of vomiting, moderate, nonradiating. She has been [facility], [facility], multiple times in the past 5 days.  She has been prescribed Phenergan, Reglan, Zofran, and scopolamine patches. She complains of shortness of breath, however, with further questioning she notes it actually feels as though she gets fatigued and has to rest more often, especially when caring for and chasing around her 664-year-old son.  She has had constipation for the last week. She has an appointment with the health department 4/9 at 9:15 AM.  Denies fever/chills, cough, diarrhea, vaginal bleeding, abnormal vaginal discharge, urinary symptoms, chest pain, lower extremity pain/edema, or any other complaints.     Past Medical History:  Diagnosis Date  . Chlamydia   . Depression   . Seizures (HCC)    childhood  . Vaginal Pap smear, abnormal     Patient Active Problem List   Diagnosis Date Noted  . Normal spontaneous vaginal delivery 10/10/2014  . Status post induction of labor 10/10/2014  . Term pregnancy 10/08/2014  . [redacted] weeks gestation of pregnancy   . Encounter for routine screening for malformation using ultrasonics   . Abnormal biochemical finding on antenatal screening of mother     Past Surgical History:  Procedure Laterality Date  . NO PAST SURGERIES       OB History    Gravida  2   Para  1   Term  1   Preterm      AB      Living  1     SAB      TAB      Ectopic      Multiple  0   Live Births  1            Home Medications    Prior to Admission medications   Medication Sig Start Date End Date Taking? Authorizing Provider  Doxylamine-Pyridoxine 10-10 MG TBEC Take 2 tablets orally at bedtime on day 1 and 2.  If symptoms persist, take 1 tablet in the morning and 2 tablets at bedtime on day 3. If symptoms persist, take 1 tablet in the morning, 1 tablet in the afternoon, and 2 tablets at bedtime on day 4 and beyond. 05/04/18   Joy, Shawn C, PA-C  famotidine (PEPCID) 20 MG tablet Take 1 tablet (20 mg total) by mouth 2 (two) times daily. 05/01/18   Rolm BookbinderNeill, Caroline M, CNM  metoCLOPramide (REGLAN) 10 MG tablet Take 1 tablet (10 mg total) by mouth every 6 (six) hours as needed for nausea. 05/02/18   Donette LarryBhambri, Melanie, CNM  Prenatal Vit-Fe Fumarate-FA (PRENATAL MULTIVITAMIN) TABS tablet Take 1 tablet by mouth daily at 12 noon.    [provider]  scopolamine (TRANSDERM-SCOP) 1 MG/3DAYS Place 1 patch (1.5 mg total) onto the skin every 3 (three) days.  05/05/18   Donette Larry, CNM    Family History Family History  Problem Relation Age of Onset  . Hypertension Mother     Social History Social History   Tobacco Use  . Smoking status: Current Every Day Smoker  . Smokeless tobacco: Never Used  Substance Use Topics  . Alcohol use: No  . Drug use: Yes    Types: Marijuana    Comment: marijuana- stopped x 10 dys     Allergies   Patient has no known allergies.   Review of Systems Review of Systems  Constitutional: Positive for fatigue. Negative for chills and fever.  Respiratory: Negative for cough.   Cardiovascular: Negative for chest pain and leg swelling.  Gastrointestinal: Positive for abdominal pain, nausea and vomiting. Negative for diarrhea.  Genitourinary: Negative for dysuria, flank pain, frequency, hematuria, pelvic  pain, vaginal bleeding and vaginal discharge.  Neurological: Negative for dizziness, syncope, light-headedness and headaches.  All other systems reviewed and are negative.    Physical Exam Updated Vital Signs BP 119/64 (BP Location: Left Arm)   Pulse 60   Temp 98.3 F (36.8 C) (Oral)   Resp 16   LMP 01/07/2018 (Exact Date)   SpO2 100%   Physical Exam Vitals signs and nursing note reviewed.  Constitutional:      General: She is not in acute distress.    Appearance: She is well-developed. She is not diaphoretic.  HENT:     Head: Normocephalic and atraumatic.     Mouth/Throat:     Mouth: Mucous membranes are moist.     Pharynx: Oropharynx is clear.  Eyes:     Conjunctiva/sclera: Conjunctivae normal.  Neck:     Musculoskeletal: Neck supple.  Cardiovascular:     Rate and Rhythm: Normal rate and regular rhythm.     Pulses: Normal pulses.     Heart sounds: Normal heart sounds.     Comments: Tactile temperature in the extremities appropriate and equal bilaterally. Pulmonary:     Effort: Pulmonary effort is normal. No respiratory distress.     Breath sounds: Normal breath sounds.     Comments: No increased work of breathing.  Speaks in full sentences without noted difficulty. Abdominal:     Palpations: Abdomen is soft.     Tenderness: There is no guarding.     Comments: Patient initially indicated generalized abdominal tenderness, however, upon multiple reassessments, patient showed no reaction to abdominal palpation, including during bedside ultrasound.  Musculoskeletal:     Right lower leg: No edema.     Left lower leg: No edema.  Lymphadenopathy:     Cervical: No cervical adenopathy.  Skin:    General: Skin is warm and dry.  Neurological:     Mental Status: She is alert.  Psychiatric:        Mood and Affect: Mood and affect normal.        Speech: Speech normal.        Behavior: Behavior normal.      ED Treatments / Results  Labs (all labs ordered are listed,  but only abnormal results are displayed) Labs Reviewed - No data to display  EKG None  Radiology No results found.  Procedures Ultrasound ED OB Pelvic Date/Time: 05/04/2018 1:38 AM Performed by: Anselm Pancoast, PA-C Authorized by: Anselm Pancoast, PA-C   Procedure details:    Indications comment:  N/V and abdominal soreness   Scope: Fetal heart rate and movement.   Technique:  Transabdominal obstetric (HCG+) exam  Images: archived    Uterine findings:    Intrauterine pregnancy: identified     Single gestation: identified     Fetal heart rate: identified (150 bpm)      Comments:     Baby is active and showing good movement.    (including critical care time)  Medications Ordered in ED Medications - No data to display   Initial Impression / Assessment and Plan / ED Course  I have reviewed the triage vital signs and the nursing notes.  Pertinent labs & imaging results that were available during my care of the patient were reviewed by me and considered in my medical decision making (see chart for details).        Patient presents with nausea and vomiting. Patient is nontoxic appearing, afebrile, not tachycardic, not tachypneic, not hypotensive, maintains excellent SPO2 on room air, and is in no apparent distress.  I had a long conversation with the patient regarding expectations in the setting of hyperemesis gravidarum.  She stated she had concerns about her nausea and vomiting because she did not have these symptoms with her previous pregnancy.  However, following our conversation patient appeared much more relaxed, was laughing and joking, and stated she felt much better. She had no episodes of emesis during her time in the ED.  She tolerated PO.  She has close follow-up with OB/GYN scheduled for less than 12 hours from time of discharge. The patient was given instructions for home care as well as return precautions. Patient voices understanding of these instructions, accepts  the plan, and is comfortable with discharge.   Findings and plan of care discussed with Drema Pry, MD.    Vitals:   05/04/18 0145 05/04/18 0200 05/04/18 0215 05/04/18 0230  BP: 103/64 (!) 107/57 121/73 126/77  Pulse: (!) 58 (!) 58    Resp:      Temp:      TempSrc:      SpO2: 100% 100%       Final Clinical Impressions(s) / ED Diagnoses   Final diagnoses:  Hyperemesis gravidarum    ED Discharge Orders         Ordered    Doxylamine-Pyridoxine 10-10 MG TBEC     05/04/18 0222           Anselm Pancoast, PA-C 05/04/18 0327    Nira Conn, MD 05/06/18 0423

## 2018-05-18 ENCOUNTER — Other Ambulatory Visit (HOSPITAL_COMMUNITY): Payer: Self-pay | Admitting: Family Medicine

## 2018-05-18 DIAGNOSIS — R772 Abnormality of alphafetoprotein: Secondary | ICD-10-CM

## 2018-05-29 ENCOUNTER — Ambulatory Visit (HOSPITAL_COMMUNITY): Payer: BLUE CROSS/BLUE SHIELD | Admitting: *Deleted

## 2018-05-29 ENCOUNTER — Ambulatory Visit (HOSPITAL_COMMUNITY)
Admission: RE | Admit: 2018-05-29 | Discharge: 2018-05-29 | Disposition: A | Discharge status: Home / Self Care | Payer: BLUE CROSS/BLUE SHIELD | Source: Ambulatory Visit | Attending: Obstetrics and Gynecology | Admitting: Obstetrics and Gynecology

## 2018-05-29 ENCOUNTER — Other Ambulatory Visit (HOSPITAL_COMMUNITY): Payer: Self-pay | Admitting: *Deleted

## 2018-05-29 ENCOUNTER — Other Ambulatory Visit: Payer: Self-pay

## 2018-05-29 ENCOUNTER — Encounter (HOSPITAL_COMMUNITY): Payer: Self-pay

## 2018-05-29 VITALS — BP 116/53 | HR 79 | Temp 97.6°F

## 2018-05-29 DIAGNOSIS — O280 Abnormal hematological finding on antenatal screening of mother: Secondary | ICD-10-CM | POA: Diagnosis not present

## 2018-05-29 DIAGNOSIS — O99352 Diseases of the nervous system complicating pregnancy, second trimester: Secondary | ICD-10-CM

## 2018-05-29 DIAGNOSIS — Z363 Encounter for antenatal screening for malformations: Secondary | ICD-10-CM | POA: Diagnosis not present

## 2018-05-29 DIAGNOSIS — G40909 Epilepsy, unspecified, not intractable, without status epilepticus: Secondary | ICD-10-CM | POA: Diagnosis not present

## 2018-05-29 DIAGNOSIS — R772 Abnormality of alphafetoprotein: Secondary | ICD-10-CM | POA: Insufficient documentation

## 2018-05-29 DIAGNOSIS — O09899 Supervision of other high risk pregnancies, unspecified trimester: Secondary | ICD-10-CM

## 2018-05-29 DIAGNOSIS — O289 Unspecified abnormal findings on antenatal screening of mother: Secondary | ICD-10-CM | POA: Diagnosis not present

## 2018-05-29 DIAGNOSIS — Z3A20 20 weeks gestation of pregnancy: Secondary | ICD-10-CM

## 2018-06-10 ENCOUNTER — Inpatient Hospital Stay (HOSPITAL_COMMUNITY)
Admission: AD | Admit: 2018-06-10 | Discharge: 2018-06-10 | Disposition: A | Discharge status: Home / Self Care | Payer: BLUE CROSS/BLUE SHIELD | Attending: Obstetrics and Gynecology | Admitting: Obstetrics and Gynecology

## 2018-06-10 ENCOUNTER — Encounter (HOSPITAL_COMMUNITY): Payer: Self-pay

## 2018-06-10 ENCOUNTER — Other Ambulatory Visit: Payer: Self-pay

## 2018-06-10 DIAGNOSIS — F12988 Cannabis use, unspecified with other cannabis-induced disorder: Secondary | ICD-10-CM | POA: Diagnosis not present

## 2018-06-10 DIAGNOSIS — Z3A22 22 weeks gestation of pregnancy: Secondary | ICD-10-CM | POA: Diagnosis not present

## 2018-06-10 DIAGNOSIS — O99322 Drug use complicating pregnancy, second trimester: Secondary | ICD-10-CM | POA: Diagnosis not present

## 2018-06-10 DIAGNOSIS — O99332 Smoking (tobacco) complicating pregnancy, second trimester: Secondary | ICD-10-CM | POA: Insufficient documentation

## 2018-06-10 DIAGNOSIS — R1116 Cannabis hyperemesis syndrome: Secondary | ICD-10-CM

## 2018-06-10 DIAGNOSIS — F17200 Nicotine dependence, unspecified, uncomplicated: Secondary | ICD-10-CM | POA: Diagnosis not present

## 2018-06-10 DIAGNOSIS — F1290 Cannabis use, unspecified, uncomplicated: Secondary | ICD-10-CM | POA: Diagnosis not present

## 2018-06-10 DIAGNOSIS — O212 Late vomiting of pregnancy: Secondary | ICD-10-CM | POA: Insufficient documentation

## 2018-06-10 DIAGNOSIS — R112 Nausea with vomiting, unspecified: Secondary | ICD-10-CM

## 2018-06-10 LAB — URINALYSIS, ROUTINE W REFLEX MICROSCOPIC
Bilirubin Urine: NEGATIVE
Glucose, UA: NEGATIVE mg/dL
Ketones, ur: 20 mg/dL — AB
Leukocytes,Ua: NEGATIVE
Nitrite: NEGATIVE
Protein, ur: 100 mg/dL — AB
Specific Gravity, Urine: 1.026 (ref 1.005–1.030)
pH: 7 (ref 5.0–8.0)

## 2018-06-10 LAB — RAPID URINE DRUG SCREEN, HOSP PERFORMED
Amphetamines: NOT DETECTED
Barbiturates: NOT DETECTED
Benzodiazepines: NOT DETECTED
Cocaine: NOT DETECTED
Opiates: NOT DETECTED
Tetrahydrocannabinol: POSITIVE — AB

## 2018-06-10 MED ORDER — HALOPERIDOL LACTATE 5 MG/ML IJ SOLN: 5.0000 mg | Freq: Once | INTRAMUSCULAR | Status: DC

## 2018-06-10 MED ORDER — ONDANSETRON 8 MG PO TBDP: 8.0000 mg | ORAL_TABLET | Freq: Three times a day (TID) | ORAL | 1 refills | Status: DC | PRN

## 2018-06-10 MED ORDER — ONDANSETRON 4 MG PO TBDP
8.0000 mg | ORAL_TABLET | Freq: Once | ORAL | Status: AC
  Administered 2018-06-10: 8 mg via ORAL
  Filled 2018-06-10: qty 2

## 2018-06-10 MED ORDER — HALOPERIDOL LACTATE 5 MG/ML IJ SOLN
2.0000 mg | Freq: Once | INTRAMUSCULAR | Status: AC
  Administered 2018-06-10: 2 mg via INTRAMUSCULAR
  Filled 2018-06-10: qty 0.4

## 2018-06-10 NOTE — MAU Note (Signed)
Crystal Walters is a 23 y.o. at [redacted]w[redacted]d here in MAU reporting: reports vomiting since yesterday morning, states she is unable to keep anything down. Emesis > 10 times since yesterday. No abdominal pain, no vaginal bleeding. States she is having to get in the shower every 2 hours, states she has a hx of marijuanna use but "it has been a while", can't remember last use.  Onset of complaint: since yesterday  Pain score: 0/10  Lab orders placed from triage: UA

## 2018-06-10 NOTE — MAU Provider Note (Addendum)
History     CSN: [id number]  Arrival date and time: 06/10/18 1034   First Provider Initiated Contact with Patient 06/10/18 1140     Chief Complaint  Patient presents with  . Emesis   Crystal Walters is a 23 y.o. G2P1001 at [redacted]w[redacted]d who presents today with nausea and vomiting. She states that this started yesterday morning and she has vomited more than 10 times since then. She states that the only thing that helps is getting in the shower. She has been taking a shower about every 2 hours, and then she can sleep for some time. She denies any contractions, VB or LOF. She reports that she is feeling some fetal movement.   Emesis   This is a new problem. The current episode started yesterday. The problem occurs more than 10 times per day. The problem has been unchanged. The emesis has an appearance of stomach contents. There has been no fever. Pertinent negatives include no chills, diarrhea or fever. Risk factors: pregnancy, marijuana use.  Treatments tried: Hot showers about every 2 hours  The treatment provided significant relief.    OB History    Gravida  2   Para  1   Term  1   Preterm      AB      Living  1     SAB      TAB      Ectopic      Multiple  0   Live Births  1           Past Medical History:  Diagnosis Date  . Chlamydia   . Depression   . Seizures (HCC)    childhood  . Vaginal Pap smear, abnormal     Past Surgical History:  Procedure Laterality Date  . NO PAST SURGERIES      Family History  Problem Relation Age of Onset  . Hypertension Mother     Social History   Tobacco Use  . Smoking status: Current Every Day Smoker  . Smokeless tobacco: Never Used  Substance Use Topics  . Alcohol use: No  . Drug use: Not Currently    Types: Marijuana    Comment: marijuana- stopped x 10 dys    Allergies: No Known Allergies  Medications Prior to Admission  Medication Sig Dispense Refill Last Dose  . Doxylamine-Pyridoxine 10-10 MG TBEC Take 2  tablets orally at bedtime on day 1 and 2.  If symptoms persist, take 1 tablet in the morning and 2 tablets at bedtime on day 3. If symptoms persist, take 1 tablet in the morning, 1 tablet in the afternoon, and 2 tablets at bedtime on day 4 and beyond. (Patient not taking: Reported on 05/29/2018) 60 tablet 0 Not Taking  . famotidine (PEPCID) 20 MG tablet Take 1 tablet (20 mg total) by mouth 2 (two) times daily. (Patient not taking: Reported on 05/29/2018) 30 tablet 0 Not Taking  . metoCLOPramide (REGLAN) 10 MG tablet Take 1 tablet (10 mg total) by mouth every 6 (six) hours as needed for nausea. (Patient not taking: Reported on 05/29/2018) 30 tablet 1 Not Taking  . Prenatal Vit-Fe Fumarate-FA (PRENATAL MULTIVITAMIN) TABS tablet Take 1 tablet by mouth daily at 12 noon.   Taking  . scopolamine (TRANSDERM-SCOP) 1 MG/3DAYS Place 1 patch (1.5 mg total) onto the skin every 3 (three) days. (Patient not taking: Reported on 05/29/2018) 10 patch 12 Not Taking    Review of Systems  Constitutional: Negative for chills and fever.  Gastrointestinal: Positive for nausea and vomiting. Negative for diarrhea.  Genitourinary: Negative for dysuria, pelvic pain, vaginal bleeding and vaginal discharge.   Physical Exam   Blood pressure 121/73, pulse 65, resp. rate 18, height 5\' 7"  (1.702 m), weight 62.8 kg, last menstrual period 01/07/2018, SpO2 100 %, unknown if currently breastfeeding.  Physical Exam  Nursing note and vitals reviewed. Constitutional: She is oriented to person, place, and time. She appears well-developed and well-nourished. No distress.  HENT:  Head: Normocephalic.  Cardiovascular: Normal rate.  Respiratory: Effort normal.  GI: Soft. There is no abdominal tenderness. There is no rebound.  Neurological: She is alert and oriented to person, place, and time.  Skin: Skin is warm and dry.  Psychiatric: She has a normal mood and affect.   +FHT 165 with doppler  MAU Course  Procedures  MDM UDS and [place]  pending  12:23pm Care turned over to Judeth Horn, NP  Thressa Sheller DNP, CNM  06/10/18  12:23 PM   Care taken over by Steward Drone CNM at 1300 Lab results reviewed:  Results for orders placed or performed during the hospital encounter of 06/10/18 (from the past 24 hour(s))  Urinalysis, Routine w reflex microscopic     Status: Abnormal   Collection Time: 06/10/18 11:24 AM  Result Value Ref Range   Color, Urine YELLOW YELLOW   APPearance HAZY (A) CLEAR   Specific Gravity, Urine 1.026 1.005 - 1.030   pH 7.0 5.0 - 8.0   Glucose, UA NEGATIVE NEGATIVE mg/dL   Hgb urine dipstick SMALL (A) NEGATIVE   Bilirubin Urine NEGATIVE NEGATIVE   Ketones, ur 20 (A) NEGATIVE mg/dL   Protein, ur 573 (A) NEGATIVE mg/dL   Nitrite NEGATIVE NEGATIVE   Leukocytes,Ua NEGATIVE NEGATIVE   RBC / HPF 0-5 0 - 5 RBC/hpf   WBC, UA 6-10 0 - 5 WBC/hpf   Bacteria, UA RARE (A) NONE SEEN   Squamous Epithelial / LPF 0-5 0 - 5   Mucus PRESENT   Rapid urine drug screen (hospital performed)     Status: Abnormal   Collection Time: 06/10/18 11:24 AM  Result Value Ref Range   Opiates NONE DETECTED NONE DETECTED   Cocaine NONE DETECTED NONE DETECTED   Benzodiazepines NONE DETECTED NONE DETECTED   Amphetamines NONE DETECTED NONE DETECTED   Tetrahydrocannabinol POSITIVE (A) NONE DETECTED   Barbiturates NONE DETECTED NONE DETECTED   +UDS for [facility] after patient denies use in the past couple of months. IM haldol given.  Upon reassessment offered PO challenge, patient refused challenge and wants to go home.   Discussed reasons to return to MAU, educated on [facility] use during pregnancy and encouraged patient to stop smoking. Rx for Zofran sent to pharmacy on file. Pt stable at time of discharge.   Assessment and Plan   1. Cannabinoid hyperemesis syndrome (HCC)   2. [redacted] weeks gestation of pregnancy    Discharge home Follow up as scheduled for prenatal care  Return to MAU as needed for emergencies or reasons discussed  Rx  for Zofran  Encouraged cessation of marijuana   Follow-up Information    Department, [facility] Follow up.   Why:  Follow up as scheduled for prenatal care  Contact information: [address] [place] [address] [phone]          Allergies as of 06/10/2018   No Known Allergies     Medication List    STOP taking these medications   Doxylamine-Pyridoxine 10-10 MG Tbec  metoCLOPramide 10 MG tablet Commonly known as:  REGLAN     TAKE these medications   famotidine 20 MG tablet Commonly known as:  PEPCID Take 1 tablet (20 mg total) by mouth 2 (two) times daily.   ondansetron 8 MG disintegrating tablet Commonly known as:  Zofran ODT Take 1 tablet (8 mg total) by mouth every 8 (eight) hours as needed for nausea or vomiting.   prenatal multivitamin Tabs tablet Take 1 tablet by mouth daily at 12 noon.   scopolamine 1 MG/3DAYS Commonly known as:  TRANSDERM-SCOP Place 1 patch (1.5 mg total) onto the skin every 3 (three) days.      Crystal Walters, CNM 06/10/18, 2:30 PM

## 2018-06-10 NOTE — Discharge Instructions (Signed)
Marijuana Use During Pregnancy and Breastfeeding    Marijuana is the dried leaves, flowers, and stems of the Cannabis sativa or Cannabis indica plant. The plant's active ingredients (cannabinoids), including a chemical called THC, change the chemistry of the brain. Marijuana smoke also has many of the same chemicals as cigarette smoke that cause breathing problems. Marijuana gets into your blood through your lungs when you smoke it and through your digestive system when you swallow it.  Using marijuana in any form may be harmful for you and your baby when you are trying to become pregnant and during pregnancy. This includes marijuana that is prescribed to you by a health care provider (medical marijuana). Once marijuana is in your blood, it can travel through your placenta to your baby. It may also pass through breast milk.  How does this affect me?  Marijuana affects you both mentally and physically. Using marijuana can make you feel high and relaxed. It can also have negative effects, especially at high doses or with long-term use. These include:   Rapid heartbeat and stress on your heart.   Lung irritation and breathing problems.   Difficulty thinking and making decisions.   Seeing or believing things that are not true (hallucinations and paranoia).   Mood swings, depression, or anxiety.   Decreased ability to learn and remember.   Difficulty getting pregnant.  Marijuana can also affect your pregnancy. Not all the effects are known. However, if you use marijuana during pregnancy, you may:   Be less likely to get regular prenatal care and do the things that you need to do to have a healthy pregnancy.   Be more likely to use other drugs that can harm your pregnancy, like drinking alcohol and smoking cigarettes.   Be at higher risk of having your baby die after 28 weeks of pregnancy (stillbirth).   Be at higher risk of giving birth before 37 weeks of pregnancy (premature birth).  How does this affect my  baby?  If you use marijuana during pregnancy, this may affect your baby's development, birth, and life after birth. Your baby may:   Be born prematurely, which can cause physical and mental problems.   Be born with a low birth weight, which can lead to physical and mental problems.   Have problems with brain development.   Have difficulty growing.   Have attention and behavior problems later in life.   Do poorly at school and have learning problems later in life.   Have problems with vision and coordination.   Be at higher risk for using marijuana by age 14.  More research is needed to find out exactly how marijuana affects a baby during breastfeeding. Some studies suggest that the chemicals in marijuana can be passed to a baby through breast milk. To limit possible risks, you should not use marijuana during breastfeeding.  Follow these instructions at home:   Let your health care provider know if you use marijuana before trying to get pregnant, during pregnancy, or during breastfeeding.   Do not use marijuana in any form when you are trying to get pregnant, when you are pregnant, or when you are breastfeeding. If you are having trouble stopping marijuana use, ask your health care provider for help.   Do not smoke. If you need help quitting, ask your health care provider for help.   If you are using medical marijuana, ask your health care provider to switch you to a medicine that is safer to use during   pregnancy or breastfeeding.   Keep all your prenatal visits as told by your health care provider. This is important.  Where to find more information  National Institute on Drug Abuse: www.drugabuse.gov  March of Dimes: www.marchofdimes.org/pregnancy  Contact a health care provider if:   You use marijuana and want to get pregnant.   You use marijuana during pregnancy or breastfeeding.   You need help stopping marijuana use.  Get help right away if:   Your baby is not gaining weight or growing as  expected.  Summary   Using marijuana in any form may be harmful for you and your baby when you are trying to become pregnant, during pregnancy, and during breastfeeding. This includes marijuana that is prescribed to you (medical marijuana).   Some studies suggest that marijuana may pass through breast milk and can affect your baby's brain development.   Talk to your health care provider if you use marijuana in any form while trying to get pregnant, during pregnancy, or while breastfeeding.   Ask your health care provider for help if you are not able to stop using marijuana.  This information is not intended to replace advice given to you by your health care provider. Make sure you discuss any questions you have with your health care provider.  Document Released: 09/29/2016 Document Revised: 09/29/2016 Document Reviewed: 09/29/2016  Elsevier Interactive Patient Education  2019 [employer]

## 2018-06-26 ENCOUNTER — Ambulatory Visit (HOSPITAL_COMMUNITY): Payer: BLUE CROSS/BLUE SHIELD

## 2018-06-29 ENCOUNTER — Ambulatory Visit (HOSPITAL_COMMUNITY): Payer: BLUE CROSS/BLUE SHIELD

## 2018-06-29 ENCOUNTER — Ambulatory Visit (HOSPITAL_COMMUNITY): Admission: RE | Admit: 2018-06-29 | Payer: BLUE CROSS/BLUE SHIELD | Source: Ambulatory Visit

## 2018-06-29 ENCOUNTER — Encounter (HOSPITAL_COMMUNITY): Payer: Self-pay

## 2018-07-03 ENCOUNTER — Encounter (HOSPITAL_COMMUNITY): Payer: Self-pay

## 2018-07-04 ENCOUNTER — Other Ambulatory Visit: Payer: Self-pay

## 2018-07-04 ENCOUNTER — Ambulatory Visit (HOSPITAL_COMMUNITY): Payer: BLUE CROSS/BLUE SHIELD | Admitting: *Deleted

## 2018-07-04 ENCOUNTER — Ambulatory Visit (HOSPITAL_COMMUNITY)
Admission: RE | Admit: 2018-07-04 | Discharge: 2018-07-04 | Disposition: A | Discharge status: Home / Self Care | Payer: BLUE CROSS/BLUE SHIELD | Source: Ambulatory Visit | Attending: Obstetrics and Gynecology | Admitting: Obstetrics and Gynecology

## 2018-07-04 VITALS — Temp 98.0°F

## 2018-07-04 DIAGNOSIS — O09899 Supervision of other high risk pregnancies, unspecified trimester: Secondary | ICD-10-CM | POA: Insufficient documentation

## 2018-07-04 DIAGNOSIS — O09892 Supervision of other high risk pregnancies, second trimester: Secondary | ICD-10-CM | POA: Diagnosis not present

## 2018-07-04 DIAGNOSIS — Z3A25 25 weeks gestation of pregnancy: Secondary | ICD-10-CM

## 2018-07-04 DIAGNOSIS — Z362 Encounter for other antenatal screening follow-up: Secondary | ICD-10-CM

## 2018-07-04 DIAGNOSIS — R772 Abnormality of alphafetoprotein: Secondary | ICD-10-CM

## 2018-07-04 DIAGNOSIS — G40909 Epilepsy, unspecified, not intractable, without status epilepticus: Secondary | ICD-10-CM

## 2018-07-04 DIAGNOSIS — O280 Abnormal hematological finding on antenatal screening of mother: Secondary | ICD-10-CM | POA: Diagnosis not present

## 2018-07-04 DIAGNOSIS — O289 Unspecified abnormal findings on antenatal screening of mother: Secondary | ICD-10-CM

## 2018-07-04 DIAGNOSIS — O99352 Diseases of the nervous system complicating pregnancy, second trimester: Secondary | ICD-10-CM

## 2018-07-05 ENCOUNTER — Other Ambulatory Visit (HOSPITAL_COMMUNITY): Payer: Self-pay | Admitting: *Deleted

## 2018-07-05 DIAGNOSIS — O09899 Supervision of other high risk pregnancies, unspecified trimester: Secondary | ICD-10-CM

## 2018-07-07 ENCOUNTER — Ambulatory Visit (HOSPITAL_COMMUNITY): Payer: BLUE CROSS/BLUE SHIELD

## 2018-07-07 ENCOUNTER — Encounter (HOSPITAL_COMMUNITY): Payer: Self-pay

## 2018-08-02 DIAGNOSIS — Z3483 Encounter for supervision of other normal pregnancy, third trimester: Secondary | ICD-10-CM | POA: Diagnosis not present

## 2018-08-22 ENCOUNTER — Other Ambulatory Visit (HOSPITAL_COMMUNITY): Payer: Self-pay | Admitting: *Deleted

## 2018-08-22 ENCOUNTER — Ambulatory Visit (HOSPITAL_COMMUNITY)
Admission: RE | Admit: 2018-08-22 | Discharge: 2018-08-22 | Disposition: A | Discharge status: Home / Self Care | Payer: BC Managed Care – PPO | Source: Ambulatory Visit | Attending: Maternal & Fetal Medicine | Admitting: Maternal & Fetal Medicine

## 2018-08-22 ENCOUNTER — Ambulatory Visit (HOSPITAL_COMMUNITY): Payer: BLUE CROSS/BLUE SHIELD

## 2018-08-22 ENCOUNTER — Other Ambulatory Visit: Payer: Self-pay

## 2018-08-22 DIAGNOSIS — Z3A32 32 weeks gestation of pregnancy: Secondary | ICD-10-CM

## 2018-08-22 DIAGNOSIS — Z362 Encounter for other antenatal screening follow-up: Secondary | ICD-10-CM | POA: Diagnosis not present

## 2018-08-22 DIAGNOSIS — O99353 Diseases of the nervous system complicating pregnancy, third trimester: Secondary | ICD-10-CM

## 2018-08-22 DIAGNOSIS — G40909 Epilepsy, unspecified, not intractable, without status epilepticus: Secondary | ICD-10-CM | POA: Diagnosis not present

## 2018-08-22 DIAGNOSIS — O09899 Supervision of other high risk pregnancies, unspecified trimester: Secondary | ICD-10-CM | POA: Insufficient documentation

## 2018-08-22 DIAGNOSIS — O280 Abnormal hematological finding on antenatal screening of mother: Secondary | ICD-10-CM | POA: Insufficient documentation

## 2018-08-22 DIAGNOSIS — O289 Unspecified abnormal findings on antenatal screening of mother: Secondary | ICD-10-CM | POA: Diagnosis not present

## 2018-09-18 DIAGNOSIS — Z3483 Encounter for supervision of other normal pregnancy, third trimester: Secondary | ICD-10-CM | POA: Diagnosis not present

## 2018-09-19 ENCOUNTER — Ambulatory Visit (HOSPITAL_COMMUNITY): Payer: BC Managed Care – PPO

## 2018-09-19 ENCOUNTER — Ambulatory Visit (HOSPITAL_COMMUNITY)
Admission: RE | Admit: 2018-09-19 | Discharge: 2018-09-19 | Disposition: A | Discharge status: Home / Self Care | Payer: BC Managed Care – PPO | Source: Ambulatory Visit | Attending: Obstetrics and Gynecology | Admitting: Obstetrics and Gynecology

## 2018-09-19 ENCOUNTER — Other Ambulatory Visit: Payer: Self-pay

## 2018-09-19 DIAGNOSIS — Z362 Encounter for other antenatal screening follow-up: Secondary | ICD-10-CM | POA: Diagnosis not present

## 2018-09-19 DIAGNOSIS — O99353 Diseases of the nervous system complicating pregnancy, third trimester: Secondary | ICD-10-CM | POA: Diagnosis not present

## 2018-09-19 DIAGNOSIS — O289 Unspecified abnormal findings on antenatal screening of mother: Secondary | ICD-10-CM

## 2018-09-19 DIAGNOSIS — O280 Abnormal hematological finding on antenatal screening of mother: Secondary | ICD-10-CM | POA: Diagnosis not present

## 2018-09-19 DIAGNOSIS — G40909 Epilepsy, unspecified, not intractable, without status epilepticus: Secondary | ICD-10-CM | POA: Diagnosis not present

## 2018-09-19 DIAGNOSIS — Z3A36 36 weeks gestation of pregnancy: Secondary | ICD-10-CM

## 2018-10-09 ENCOUNTER — Inpatient Hospital Stay (HOSPITAL_COMMUNITY)
Admission: AD | Admit: 2018-10-09 | Discharge: 2018-10-09 | Disposition: A | Discharge status: Home / Self Care | Payer: BC Managed Care – PPO | Source: Ambulatory Visit | Attending: Obstetrics & Gynecology | Admitting: Obstetrics & Gynecology

## 2018-10-09 ENCOUNTER — Encounter (HOSPITAL_COMMUNITY): Payer: Self-pay

## 2018-10-09 ENCOUNTER — Other Ambulatory Visit: Payer: Self-pay

## 2018-10-09 DIAGNOSIS — Z3A39 39 weeks gestation of pregnancy: Secondary | ICD-10-CM | POA: Diagnosis not present

## 2018-10-09 DIAGNOSIS — O471 False labor at or after 37 completed weeks of gestation: Secondary | ICD-10-CM | POA: Diagnosis present

## 2018-10-09 NOTE — Discharge Instructions (Signed)
Braxton Hicks Contractions °Contractions of the uterus can occur throughout pregnancy, but they are not always a sign that you are in labor. You may have practice contractions called Braxton Hicks contractions. These false labor contractions are sometimes confused with true labor. °What are Braxton Hicks contractions? °Braxton Hicks contractions are tightening movements that occur in the muscles of the uterus before labor. Unlike true labor contractions, these contractions do not result in opening (dilation) and thinning of the cervix. Toward the end of pregnancy (32-34 weeks), Braxton Hicks contractions can happen more often and may become stronger. These contractions are sometimes difficult to tell apart from true labor because they can be very uncomfortable. You should not feel embarrassed if you go to the hospital with false labor. °Sometimes, the only way to tell if you are in true labor is for your health care provider to look for changes in the cervix. The health care provider will do a physical exam and may monitor your contractions. If you are not in true labor, the exam should show that your cervix is not dilating and your water has not broken. °If there are no other health problems associated with your pregnancy, it is completely safe for you to be sent home with false labor. You may continue to have Braxton Hicks contractions until you go into true labor. °How to tell the difference between true labor and false labor °True labor °· Contractions last 30-70 seconds. °· Contractions become very regular. °· Discomfort is usually felt in the top of the uterus, and it spreads to the lower abdomen and low back. °· Contractions do not go away with walking. °· Contractions usually become more intense and increase in frequency. °· The cervix dilates and gets thinner. °False labor °· Contractions are usually shorter and not as strong as true labor contractions. °· Contractions are usually irregular. °· Contractions  are often felt in the front of the lower abdomen and in the groin. °· Contractions may go away when you walk around or change positions while lying down. °· Contractions get weaker and are shorter-lasting as time goes on. °· The cervix usually does not dilate or become thin. °Follow these instructions at home: ° °· Take over-the-counter and prescription medicines only as told by your health care provider. °· Keep up with your usual exercises and follow other instructions from your health care provider. °· Eat and drink lightly if you think you are going into labor. °· If Braxton Hicks contractions are making you uncomfortable: °? Change your position from lying down or resting to walking, or change from walking to resting. °? Sit and rest in a tub of warm water. °? Drink enough fluid to keep your urine pale yellow. Dehydration may cause these contractions. °? Do slow and deep breathing several times an hour. °· Keep all follow-up prenatal visits as told by your health care provider. This is important. °Contact a health care provider if: °· You have a fever. °· You have continuous pain in your abdomen. °Get help right away if: °· Your contractions become stronger, more regular, and closer together. °· You have fluid leaking or gushing from your vagina. °· You pass blood-tinged mucus (bloody show). °· You have bleeding from your vagina. °· You have low back pain that you never had before. °· You feel your baby’s head pushing down and causing pelvic pressure. °· Your baby is not moving inside you as much as it used to. °Summary °· Contractions that occur before labor are   called Braxton Hicks contractions, false labor, or practice contractions. °· Braxton Hicks contractions are usually shorter, weaker, farther apart, and less regular than true labor contractions. True labor contractions usually become progressively stronger and regular, and they become more frequent. °· Manage discomfort from Braxton Hicks contractions  by changing position, resting in a warm bath, drinking plenty of water, or practicing deep breathing. °This information is not intended to replace advice given to you by your health care provider. Make sure you discuss any questions you have with your health care provider. °Document Released: 05/27/2016 Document Revised: 12/24/2016 Document Reviewed: 05/27/2016 °Elsevier Patient Education © 2020 Elsevier Inc. ° °Safe Medications in Pregnancy  ° °Acne: °Benzoyl Peroxide °Salicylic Acid ° °Backache/Headache: °Tylenol: 2 regular strength every 4 hours OR °             2 Extra strength every 6 hours ° °Colds/Coughs/Allergies: °Benadryl (alcohol free) 25 mg every 6 hours as needed °Breath right strips °Claritin °Cepacol throat lozenges °Chloraseptic throat spray °Cold-Eeze- up to three times per day °Cough drops, alcohol free °Flonase (by prescription only) °Guaifenesin °Mucinex °Robitussin DM (plain only, alcohol free) °Saline nasal spray/drops °Sudafed (pseudoephedrine) & Actifed ** use only after [redacted] weeks gestation and if you do not have high blood pressure °Tylenol °Vicks Vaporub °Zinc lozenges °Zyrtec  ° °Constipation: °Colace °Ducolax suppositories °Fleet enema °Glycerin suppositories °Metamucil °Milk of magnesia °Miralax °Senokot °Smooth move tea ° °Diarrhea: °Kaopectate °Imodium A-D ° °*NO pepto Bismol ° °Hemorrhoids: °Anusol °Anusol HC °Preparation H °Tucks ° °Indigestion: °Tums °Maalox °Mylanta °Zantac  °Pepcid ° °Insomnia: °Benadryl (alcohol free) 25mg every 6 hours as needed °Tylenol PM °Unisom, no Gelcaps ° °Leg Cramps: °Tums °MagGel ° °Nausea/Vomiting:  °Bonine °Dramamine °Emetrol °Ginger extract °Sea bands °Meclizine  °Nausea medication to take during pregnancy:  °Unisom (doxylamine succinate 25 mg tablets) Take one tablet daily at bedtime. If symptoms are not adequately controlled, the dose can be increased to a maximum recommended dose of two tablets daily (1/2 tablet in the morning, 1/2 tablet  mid-afternoon and one at bedtime). °Vitamin B6 100mg tablets. Take one tablet twice a day (up to 200 mg per day). ° °Skin Rashes: °Aveeno products °Benadryl cream or 25mg every 6 hours as needed °Calamine Lotion °1% cortisone cream ° °Yeast infection: °Gyne-lotrimin 7 °Monistat 7 ° ° °**If taking multiple medications, please check labels to avoid duplicating the same active ingredients °**take medication as directed on the label °** Do not exceed 4000 mg of tylenol in 24 hours °**Do not take medications that contain aspirin or ibuprofen ° ° ° ° °

## 2018-10-09 NOTE — MAU Note (Signed)
I have communicated with Len Blalock CNM and reviewed vital signs:  Vitals:   10/09/18 0355 10/09/18 0602  BP:  113/82  Pulse:  90  Resp:  16  Temp:  98.5 F (36.9 C)  SpO2: 99%     Vaginal exam:  Dilation: 4 Effacement (%): 90 Cervical Position: Posterior Station: 0 Presentation: Vertex Exam by:: Cruzita Lipa RN,   Also reviewed contraction pattern and that non-stress test is reactive.  It has been documented that patient is contracting every 2-3 minutes with minimal cervical change over 2.5 hours not indicating active labor.  Patient verbalized pain level remains the same and has dr appt today at noon that she plans to and encouraged to keep. Patient denies any other complaints.  Based on this report provider has given order for discharge.  A discharge order and diagnosis entered by a provider.   Labor discharge instructions reviewed with patient.

## 2018-10-09 NOTE — MAU Provider Note (Signed)
S: Ms. Crystal Walters is a 23 y.o. G2P1001 at [redacted]w[redacted]d  who presents to MAU today complaining contractions q 5-10 minutes since 0100. She denies vaginal bleeding. She denies LOF. She reports normal fetal movement.    O: BP 111/75 (BP Location: Right Arm)   Pulse (!) 104   Temp 97.7 F (36.5 C) (Oral)   Resp 18   Ht 5\' 7"  (1.702 m)   Wt 74.4 kg   LMP 01/07/2018 (Exact Date)   SpO2 100%   BMI 25.69 kg/m  GENERAL: Well-developed, well-nourished female in no acute distress.  HEAD: Normocephalic, atraumatic.  CHEST: Normal effort of breathing, regular heart rate ABDOMEN: Soft, nontender, gravid  Cervical exam:  Dilation: 4 Effacement (%): 90 Cervical Position: Posterior Station: 0 Presentation: Vertex Exam by:: benji stanley RN   Fetal Monitoring: Baseline: 120 Variability: moderate Accelerations: 15x15 Decelerations: none Contractions: 1-5  Patient made change over first hour but refused admission stating she wanted to "make sure she is in labor" before admission. Rechecked 2 hours later with no cervical change.   A: [facility]UP at [redacted]w[redacted]d  False labor  P: Discharge home -Labor precautions discussed -Patient advised to follow-up with [facility] today as scheduled for prenatal care -Patient may return to MAU as needed or if her condition were to change or worsen   Wende Mott, [place] 10/09/2018 5:58 AM

## 2018-10-09 NOTE — MAU Note (Signed)
Every 5 min contractions since 1:30 am and was having them on Friday also but stopped. Baby moving well.  No leaking. No bleeding. Feeling pressure always but today was worse and shoots up back.

## 2018-10-16 ENCOUNTER — Other Ambulatory Visit (HOSPITAL_COMMUNITY): Payer: Self-pay | Admitting: Advanced Practice Midwife

## 2018-10-16 ENCOUNTER — Telehealth (HOSPITAL_COMMUNITY): Payer: Self-pay | Admitting: *Deleted

## 2018-10-16 NOTE — Telephone Encounter (Signed)
Preadmission screen  

## 2018-10-17 ENCOUNTER — Telehealth (HOSPITAL_COMMUNITY): Payer: Self-pay | Admitting: *Deleted

## 2018-10-17 ENCOUNTER — Encounter (HOSPITAL_COMMUNITY): Payer: Self-pay | Admitting: *Deleted

## 2018-10-17 NOTE — Telephone Encounter (Signed)
Preadmission screen  

## 2018-10-19 ENCOUNTER — Other Ambulatory Visit: Payer: Self-pay

## 2018-10-19 ENCOUNTER — Other Ambulatory Visit (HOSPITAL_COMMUNITY)
Admission: RE | Admit: 2018-10-19 | Discharge: 2018-10-19 | Disposition: A | Discharge status: Home / Self Care | Payer: BC Managed Care – PPO | Source: Ambulatory Visit | Attending: Obstetrics and Gynecology | Admitting: Obstetrics and Gynecology

## 2018-10-19 DIAGNOSIS — Z20828 Contact with and (suspected) exposure to other viral communicable diseases: Secondary | ICD-10-CM | POA: Insufficient documentation

## 2018-10-19 DIAGNOSIS — Z01812 Encounter for preprocedural laboratory examination: Secondary | ICD-10-CM | POA: Diagnosis not present

## 2018-10-19 LAB — SARS CORONAVIRUS 2 BY RT PCR (HOSPITAL ORDER, PERFORMED IN ~~LOC~~ HOSPITAL LAB): SARS Coronavirus 2: NEGATIVE

## 2018-10-19 NOTE — MAU Note (Signed)
Swab collected without difficulty. 

## 2018-10-20 NOTE — H&P (Addendum)
OBSTETRIC ADMISSION HISTORY AND PHYSICAL  Crystal Walters is a 23 y.o. female G2P1001 with IUP at [redacted]w[redacted]d by LMP presenting for IOL for postdates. Reports good fetal movement. Denies vaginal bleeding, discharge or leaking of fluid. Reports blood tinged mucus plus today. Denies contractions. Denies headache, visual changes, RUQ pain, chest pain, SOB or dizziness. Denies herpes lesions throughout pregnancy and has been compliant with Valtrex medications.   Having a baby boy. Desires circumcision but unsure whether inpatient or outpatient. Unsure about contraception but considering birth control. Plans to breast feed.  She received her prenatal care at [facility].  Support person in labor: FOB   Ultrasounds Anatomy U/S: No markers of aneuploidies or fetal structural defects are seen. Fetal spine  and abdominal wall appear normal. Fetal biometry is consistent with her previously-established dates. Amniotic fluid is normal and good fetal activity is seen.  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 Chlamydia   . Depression   . Seizures ([place])    childhood  . Vaginal Pap smear, abnormal     Past Surgical History: Past Surgical History:  Procedure Laterality Date  . NO PAST SURGERIES      Obstetrical History: OB History    Gravida  2   Para  1   Term  1   Preterm      AB      Living  1     SAB      TAB      Ectopic      Multiple  0   Live Births  1           Social History: Admits to smoking nicotine and marijuana inially during her pregnancy.  Social History   Socioeconomic History  . Marital status: Single    Spouse name: Not on file  . Number of children: Not on file  . Years of education: Not on file  . Highest education level: Not on file  Occupational History  . Not on file  Social Needs  . Financial resource strain: Not hard at all  . Food  insecurity    Worry: Never true    Inability: Never true  . Transportation needs    Medical: No    Non-medical: No  Tobacco Use  . Smoking status: Former Research scientist (life sciences)  . Smokeless tobacco: Never Used  Substance and Sexual Activity  . Alcohol use: No  . Drug use: Not Currently    Types: Marijuana    Comment: marijuana- last use was April 2020  . Sexual activity: Yes    Birth control/protection: None  Lifestyle  . Physical activity    Days per week: Not on file    Minutes per session: Not on file  . Stress: Not on file  Relationships  . Social Herbalist on phone: Not on file    Gets together: Not on file    Attends religious service: Not on file    Active member of club or organization: Not on file    Attends meetings of clubs or organizations: Not on file    Relationship status: Not on file  Other Topics Concern  . Not on file  Social History Narrative  . Not on file    Family History: Family History  Problem Relation Age of Onset  . Hypertension Mother     Allergies: No Known Allergies  No medications prior to  admission.     Review of Systems  All systems reviewed and negative except as stated in HPI  Last menstrual period 01/07/2018, unknown if currently breastfeeding. General appearance: alert, cooperative and appears stated age Lungs: chest clear, no crackles or wheeze, no respiratory distress Heart: RRR, no rubs or gallops Abdomen: soft, non-tender; gravid abdomen, bowel sounds present  Pelvic: 4, 80, -1. No sign of herpes lesions. Extremities: Homans sign is negative, no sign of DVT Presentation: cephalic Fetal monitoring: poor tracing  Uterine activity: none    Prenatal labs: ABO, Rh:  O positive  Antibody:  Negative Rubella:  Immune  RPR:   Negative  HBsAg:   Negative  HIV: Non Reactive (01/24 1154)  GBS:   negative  Glucola: 1hr 60 Genetic screening:  First screen negative. Quad screen AFP borderline elevated in mom.   Prenatal  Transfer Tool  Maternal Diabetes: No Genetic Screening: First screen negative. Quad screen AFP borderline elevated in mom.  Maternal Ultrasounds/Referrals: Normal Fetal Ultrasounds or other Referrals:  Referred to Materal Fetal Medicine  Maternal Substance Abuse:  Yes:  Type: Smoker, Marijuana Significant Maternal Medications:  Meds include: Other: Valtrex Significant Maternal Lab Results: None  No results found for this or any previous visit (from the past 24 hour(s)).  There are no active problems to display for this patient.   Assessment/Plan:  Crystal Walters is a 23 y.o. G2P1001 at [redacted]w[redacted]d here for IOL for postdates.   Labor:  Cervix 4, 80, -1. Consider AROM or pitocin after epidural. -- pain control: Q1hrly Fentanyl , pt desires epidural.   Fetal Wellbeing: EFW 6-7lbs by Leopold's. Cephalic by cervical exam --GBS negative --Continuous fetal monitoring  --Desires circumcision, unsure whether IP/OP.  Postpartum Planning --Breast feeding --Contraception: unsure, considering depo -- Rhogam-NA --Tdap 08/02/18  Crystal Octave, MD PGY-1, [facility]   CNM attestation:  I have seen and examined this patient; I agree with above documentation in the resident's note.   Staley Budzinski is a 23 y.o. G2P1001 here for IOL due to postdates  PE: BP 115/61   Pulse 75   Temp 98.2 F (36.8 C) (Oral)   Resp 16   Ht 5\' 7"  (1.702 m)   Wt 78.8 kg   LMP 01/07/2018 (Exact Date)   BMI 27.22 kg/m  Gen: calm comfortable, NAD Resp: normal effort, no distress Abd: gravid  ROS, labs, PMH reviewed  Plan: Admit to 01/09/2018 Pitocin as IOL method Anticipate vag del  [name] CNM 10/21/2018, 7:45 AM

## 2018-10-21 ENCOUNTER — Inpatient Hospital Stay (HOSPITAL_COMMUNITY)
Admission: AD | Admit: 2018-10-21 | Discharge: 2018-10-22 | DRG: 806 | Disposition: A | Discharge status: Home / Self Care | Payer: BC Managed Care – PPO | Attending: Obstetrics & Gynecology | Admitting: Obstetrics & Gynecology

## 2018-10-21 ENCOUNTER — Inpatient Hospital Stay (HOSPITAL_COMMUNITY): Payer: BC Managed Care – PPO | Admitting: Anesthesiology

## 2018-10-21 ENCOUNTER — Encounter (HOSPITAL_COMMUNITY): Payer: Self-pay | Admitting: Family Medicine

## 2018-10-21 ENCOUNTER — Other Ambulatory Visit: Payer: Self-pay

## 2018-10-21 ENCOUNTER — Inpatient Hospital Stay (HOSPITAL_COMMUNITY): Payer: BC Managed Care – PPO

## 2018-10-21 DIAGNOSIS — F1290 Cannabis use, unspecified, uncomplicated: Secondary | ICD-10-CM | POA: Diagnosis present

## 2018-10-21 DIAGNOSIS — O9832 Other infections with a predominantly sexual mode of transmission complicating childbirth: Secondary | ICD-10-CM | POA: Diagnosis present

## 2018-10-21 DIAGNOSIS — O99324 Drug use complicating childbirth: Secondary | ICD-10-CM | POA: Diagnosis present

## 2018-10-21 DIAGNOSIS — O480 Post-term pregnancy: Principal | ICD-10-CM | POA: Diagnosis present

## 2018-10-21 DIAGNOSIS — A6000 Herpesviral infection of urogenital system, unspecified: Secondary | ICD-10-CM | POA: Diagnosis present

## 2018-10-21 DIAGNOSIS — Z87891 Personal history of nicotine dependence: Secondary | ICD-10-CM

## 2018-10-21 DIAGNOSIS — Z3A40 40 weeks gestation of pregnancy: Secondary | ICD-10-CM | POA: Diagnosis not present

## 2018-10-21 LAB — CBC
HCT: 35.2 % — ABNORMAL LOW (ref 36.0–46.0)
Hemoglobin: 12.5 g/dL (ref 12.0–15.0)
MCH: 33.9 pg (ref 26.0–34.0)
MCHC: 35.5 g/dL (ref 30.0–36.0)
MCV: 95.4 fL (ref 80.0–100.0)
Platelets: 248 10*3/uL (ref 150–400)
RBC: 3.69 MIL/uL — ABNORMAL LOW (ref 3.87–5.11)
RDW: 12.9 % (ref 11.5–15.5)
WBC: 7.7 10*3/uL (ref 4.0–10.5)
nRBC: 0 % (ref 0.0–0.2)

## 2018-10-21 LAB — ABO/RH: ABO/RH(D): O POS

## 2018-10-21 LAB — TYPE AND SCREEN
ABO/RH(D): O POS
Antibody Screen: NEGATIVE

## 2018-10-21 LAB — RPR: RPR Ser Ql: NONREACTIVE

## 2018-10-21 MED ORDER — WITCH HAZEL-GLYCERIN EX PADS: 1.0000 "application " | MEDICATED_PAD | CUTANEOUS | Status: DC | PRN

## 2018-10-21 MED ORDER — LIDOCAINE HCL (PF) 1 % IJ SOLN: 30.0000 mL | INTRAMUSCULAR | Status: DC | PRN

## 2018-10-21 MED ORDER — IBUPROFEN 600 MG PO TABS
600.0000 mg | ORAL_TABLET | Freq: Four times a day (QID) | ORAL | Status: DC
  Administered 2018-10-21 – 2018-10-22 (×4): 600 mg via ORAL
  Filled 2018-10-21 (×4): qty 1

## 2018-10-21 MED ORDER — ONDANSETRON HCL 4 MG PO TABS: 4.0000 mg | ORAL_TABLET | ORAL | Status: DC | PRN

## 2018-10-21 MED ORDER — COCONUT OIL OIL: 1.0000 "application " | TOPICAL_OIL | Status: DC | PRN

## 2018-10-21 MED ORDER — LACTATED RINGERS IV SOLN
INTRAVENOUS | Status: DC
  Administered 2018-10-21: 01:00:00 via INTRAVENOUS

## 2018-10-21 MED ORDER — PRENATAL MULTIVITAMIN CH
1.0000 | ORAL_TABLET | Freq: Every day | ORAL | Status: DC
  Filled 2018-10-21: qty 1

## 2018-10-21 MED ORDER — HYDROXYZINE HCL 50 MG PO TABS: 50.0000 mg | ORAL_TABLET | Freq: Four times a day (QID) | ORAL | Status: DC | PRN

## 2018-10-21 MED ORDER — SODIUM CHLORIDE (PF) 0.9 % IJ SOLN
INTRAMUSCULAR | Status: DC | PRN
  Administered 2018-10-21: 12 mL/h via EPIDURAL

## 2018-10-21 MED ORDER — OXYCODONE-ACETAMINOPHEN 5-325 MG PO TABS: 1.0000 | ORAL_TABLET | ORAL | Status: DC | PRN

## 2018-10-21 MED ORDER — ACETAMINOPHEN 325 MG PO TABS
650.0000 mg | ORAL_TABLET | ORAL | Status: DC | PRN
  Administered 2018-10-21 – 2018-10-22 (×2): 650 mg via ORAL
  Filled 2018-10-21 (×2): qty 2

## 2018-10-21 MED ORDER — ACETAMINOPHEN 325 MG PO TABS: 650.0000 mg | ORAL_TABLET | ORAL | Status: DC | PRN

## 2018-10-21 MED ORDER — FENTANYL-BUPIVACAINE-NACL 0.5-0.125-0.9 MG/250ML-% EP SOLN
EPIDURAL | Status: AC
  Filled 2018-10-21: qty 250

## 2018-10-21 MED ORDER — ZOLPIDEM TARTRATE 5 MG PO TABS: 5.0000 mg | ORAL_TABLET | Freq: Every evening | ORAL | Status: DC | PRN

## 2018-10-21 MED ORDER — SOD CITRATE-CITRIC ACID 500-334 MG/5ML PO SOLN: 30.0000 mL | ORAL | Status: DC | PRN

## 2018-10-21 MED ORDER — OXYTOCIN 40 UNITS IN NORMAL SALINE INFUSION - SIMPLE MED
1.0000 m[IU]/min | INTRAVENOUS | Status: DC
  Administered 2018-10-21: 2 m[IU]/min via INTRAVENOUS

## 2018-10-21 MED ORDER — DIPHENHYDRAMINE HCL 25 MG PO CAPS: 25.0000 mg | ORAL_CAPSULE | Freq: Four times a day (QID) | ORAL | Status: DC | PRN

## 2018-10-21 MED ORDER — FLEET ENEMA 7-19 GM/118ML RE ENEM: 1.0000 | ENEMA | RECTAL | Status: DC | PRN

## 2018-10-21 MED ORDER — OXYTOCIN 40 UNITS IN NORMAL SALINE INFUSION - SIMPLE MED
2.5000 [IU]/h | INTRAVENOUS | Status: DC
  Filled 2018-10-21: qty 1000

## 2018-10-21 MED ORDER — LIDOCAINE HCL (PF) 1 % IJ SOLN
INTRAMUSCULAR | Status: DC | PRN
  Administered 2018-10-21 (×2): 5 mL via EPIDURAL

## 2018-10-21 MED ORDER — TETANUS-DIPHTH-ACELL PERTUSSIS 5-2.5-18.5 LF-MCG/0.5 IM SUSP: 0.5000 mL | Freq: Once | INTRAMUSCULAR | Status: DC

## 2018-10-21 MED ORDER — FENTANYL CITRATE (PF) 100 MCG/2ML IJ SOLN: 50.0000 ug | INTRAMUSCULAR | Status: DC | PRN

## 2018-10-21 MED ORDER — OXYTOCIN BOLUS FROM INFUSION
500.0000 mL | Freq: Once | INTRAVENOUS | Status: AC
  Administered 2018-10-21: 500 mL via INTRAVENOUS

## 2018-10-21 MED ORDER — LACTATED RINGERS IV SOLN: 500.0000 mL | INTRAVENOUS | Status: DC | PRN

## 2018-10-21 MED ORDER — BENZOCAINE-MENTHOL 20-0.5 % EX AERO: 1.0000 "application " | INHALATION_SPRAY | CUTANEOUS | Status: DC | PRN

## 2018-10-21 MED ORDER — SENNOSIDES-DOCUSATE SODIUM 8.6-50 MG PO TABS
2.0000 | ORAL_TABLET | ORAL | Status: DC
  Administered 2018-10-21: 2 via ORAL
  Filled 2018-10-21: qty 2

## 2018-10-21 MED ORDER — MISOPROSTOL 25 MCG QUARTER TABLET: 25.0000 ug | ORAL_TABLET | ORAL | Status: DC | PRN

## 2018-10-21 MED ORDER — SIMETHICONE 80 MG PO CHEW: 80.0000 mg | CHEWABLE_TABLET | ORAL | Status: DC | PRN

## 2018-10-21 MED ORDER — ONDANSETRON HCL 4 MG/2ML IJ SOLN: 4.0000 mg | Freq: Four times a day (QID) | INTRAMUSCULAR | Status: DC | PRN

## 2018-10-21 MED ORDER — ONDANSETRON HCL 4 MG/2ML IJ SOLN: 4.0000 mg | INTRAMUSCULAR | Status: DC | PRN

## 2018-10-21 MED ORDER — DIBUCAINE (PERIANAL) 1 % EX OINT: 1.0000 "application " | TOPICAL_OINTMENT | CUTANEOUS | Status: DC | PRN

## 2018-10-21 MED ORDER — TERBUTALINE SULFATE 1 MG/ML IJ SOLN: 0.2500 mg | Freq: Once | INTRAMUSCULAR | Status: DC | PRN

## 2018-10-21 MED ORDER — OXYCODONE-ACETAMINOPHEN 5-325 MG PO TABS: 2.0000 | ORAL_TABLET | ORAL | Status: DC | PRN

## 2018-10-21 NOTE — Progress Notes (Signed)
Crystal Walters is a 23 y.o. G2P1001 at [redacted]w[redacted]d by LMP admitted for induction of labor due to Post dates.   Subjective: Comfortable post epidural   Objective: BP 115/63   Pulse 75   Temp 97.8 F (36.6 C)   Resp 16   Ht 5\' 7"  (1.702 m)   Wt 78.8 kg   LMP 01/07/2018 (Exact Date)   BMI 27.22 kg/m  No intake/output data recorded. No intake/output data recorded.  FHT: FHR 135bpm, variability: moderate, accels present, decels:no  UC:   irregular SVE:   Dilation: 5 Effacement (%): 80 Station: -1 Exam by:: Dr. Posey Pronto  Labs: Lab Results  Component Value Date   WBC 7.7 10/21/2018   HGB 12.5 10/21/2018   HCT 35.2 (L) 10/21/2018   MCV 95.4 10/21/2018   PLT 248 10/21/2018    Assessment / Plan: Crystal Walters is a 23 y.o. G2P1001 at [redacted]w[redacted]d by LMP admitted for induction of labor due to Post dates.   Labor: Progressing normally AROM at 02:50. Clear amniotic fluid. Monitor contractions.  Fetal Wellbeing:  Category I, fetal tracing reassuring  Pain Control:  Epidural I/D:  gbs negative  Anticipated MOD:  NSVD anticipated. c-section if maternal/fetal indication   Crystal Walters 10/21/2018, 3:17 AM

## 2018-10-21 NOTE — Lactation Note (Signed)
This note was copied from a baby's chart. Lactation Consultation Note  Patient Name: Crystal Walters SAYTK'Z Date: 10/21/2018 Reason for consult: Initial assessment;Term  2107 - 2136 - I conducted an initial breast feeding consult with Ms. Crystal Walters. She states that she fed her son Crystal Walters about an hour prior to my visit, and he breast fed for 45 minutes. She reports that Crystal Walters has been breast feeding every 2-3 hours for 20-30 minute intervals.  Crystal Walters breast fed her first child, now 4, for 8 months.  I reviewed breast feeding basics including output expectations for day 1, feeding frequency in the first days, feeding on demand, and milk storage guidelines from the [facility] guide. Crystal Walters asked lots of questions about the safety of frozen breast milk and how to thaw it out.  I also had a discussion about maternal use of cigarettes and breast feeding. Crystal Walters states that she does not intend to smoke while breast feeding because she is concerned it will go into the breast milk. She did however begin smoking after 8 months of breast feeding her first child, and she discontinued breast feeding at that time. I discussed the importance of minimizing secondhand smoke exposure to a baby through clothing and other fabrics (such as within a car). She verbalized understanding.   Crystal Walters also had questions about the safety of occasional alcohol use and breast feeding. I educated on how alcohol enters the milk compartment and how long alcohol generally takes to exit the milk compartment. I answered questions about when to "pump and dump" her milk. Crystal Walters states that she did not ask these kinds of questions with her first child, and she just wants what's best for her baby. She is well aware of the benefits of breast milk and expresses caution about taking anything that will pass through her milk to baby.  I encouraged Ms. Crystal Walters to call lactation for support as needed. She declined  assistance at this visit, stating that it was going well thus far. The plan is to breast feed on demand 8-12 times a day, and wake baby to feed as needed.   Maternal Data Formula Feeding for Exclusion: No Has patient been taught Hand Expression?: Yes Does the patient have breastfeeding experience prior to this delivery?: Yes   Interventions Interventions: Breast feeding basics reviewed(INJOY guide)   Consult Status Consult Status: Follow-up Date: 10/22/18 Follow-up type: In-patient    Crystal Walters 10/21/2018, 9:41 PM

## 2018-10-21 NOTE — Discharge Summary (Signed)
Postpartum Discharge Summary  Date of Service updated-yes     Patient Name: Crystal Walters DOB: Jul 31, 1995 MRN: [id number]  Date of admission: 10/21/2018 Delivering Provider: Wende Mott   Date of discharge: 10/22/2018  Admitting diagnosis: PREG Intrauterine pregnancy: [redacted]w[redacted]d    Secondary diagnosis:  Active Problems:   Indication for care in labor or delivery  Additional problems: Marijuana use     Discharge diagnosis: Term Pregnancy Delivered                                                                                                Post partum procedures:n/a  Augmentation: AROM and Pitocin  Complications: None  Hospital course:  Onset of Labor With Vaginal Delivery     23y.o. yo G2P1001 at [id number] admitted for IOL for postdates. Patient had an uncomplicated labor course as follows: AROM with pitocin augmentation to push 2x to deliver Membrane Rupture Time/Date: 2:47 AM ,10/21/2018   Intrapartum Procedures: Episiotomy: None [1]                                         Lacerations:  None [1]  Patient had a delivery of a Viable infant. 10/21/2018  Information for the patient's newborn:  RoVannesa, Abair0[[id number]]Delivery Method: Vaginal, Spontaneous(Filed from Delivery Summary)    Pateint had an uncomplicated postpartum course.  She is ambulating, tolerating a regular diet, passing flatus, and urinating well. Patient is discharged home in stable condition on 10/22/18.  Delivery time: 8:56 AM   Magnesium Sulfate received: No BMZ received: No Rhophylac:N/A MMR:N/A Transfusion:No  Physical exam  Vitals:   10/21/18 1557 10/21/18 2030 10/22/18 0023 10/22/18 0544  BP: 115/88 125/75 121/75 118/71  Pulse: 80 75 80 76  Resp: 18 18 18 18   Temp: 98.5 F (36.9 C) 98.5 F (36.9 C) 98.4 F (36.9 C) 98.4 F (36.9 C)  TempSrc: Oral Oral Oral Oral  SpO2:  100% 99% 100%  Weight:      Height:       General: alert, cooperative and no distress Lochia:  appropriate Uterine Fundus: firm Incision: N/A DVT Evaluation: No evidence of DVT seen on physical exam. Negative Homan's sign. No cords or calf tenderness. No significant calf/ankle edema. Labs: Lab Results  Component Value Date   WBC 9.7 10/22/2018   HGB 11.3 (L) 10/22/2018   HCT 33.2 (L) 10/22/2018   MCV 96.0 10/22/2018   PLT 214 10/22/2018   CMP Latest Ref Rng & Units 05/02/2018  Glucose 70 - 99 mg/dL 91  BUN 6 - 20 mg/dL 6  Creatinine 0.44 - 1.00 mg/dL 0.75  Sodium 135 - 145 mmol/L 135  Potassium 3.5 - 5.1 mmol/L 3.9  Chloride 98 - 111 mmol/L 101  CO2 22 - 32 mmol/L 24  Calcium 8.9 - 10.3 mg/dL 8.9  Total Protein 6.5 - 8.1 g/dL 7.1  Total Bilirubin 0.3 - 1.2 mg/dL 0.6  Alkaline Phos 38 - 126 U/L 49  AST 15 -  41 U/L 24  ALT 0 - 44 U/L 13    Discharge instruction: per After Visit Summary and "Baby and Me Booklet".  After visit meds:  Allergies as of 10/22/2018   No Known Allergies     Medication List    STOP taking these medications   famotidine 20 MG tablet Commonly known as: PEPCID   ondansetron 8 MG disintegrating tablet Commonly known as: Zofran ODT   scopolamine 1 MG/3DAYS Commonly known as: TRANSDERM-SCOP     TAKE these medications   ibuprofen 600 MG tablet Commonly known as: ADVIL Take 1 tablet (600 mg total) by mouth every 6 (six) hours.   prenatal multivitamin Tabs tablet Take 1 tablet by mouth daily at 12 noon.       Diet: routine diet  Activity: Advance as tolerated. Pelvic rest for 6 weeks.   Outpatient follow up:4 weeks Follow up Appt:No future appointments. Follow up Visit: Follow-up Information    Department, [facility]. Schedule an appointment as soon as possible for a visit in 4 week(s).   Contact information: [place] [address] [phone]           [facility] patient  Newborn Data: Live born female  Birth Weight:   APGAR: 102, 78  Newborn Delivery   Birth date/time: 10/21/2018  08:56:00 Delivery type: Vaginal, Spontaneous      Baby Feeding: Breast Disposition:home with mother

## 2018-10-21 NOTE — Anesthesia Procedure Notes (Signed)
Epidural Patient location during procedure: OB  Staffing Anesthesiologist: Montez Hageman, MD Performed: anesthesiologist   Preanesthetic Checklist Completed: patient identified, site marked, surgical consent, pre-op evaluation, timeout performed, IV checked, risks and benefits discussed and monitors and equipment checked  Epidural Patient position: sitting Prep: DuraPrep Patient monitoring: heart rate, continuous pulse ox and blood pressure Approach: right paramedian Location: L3-L4 Injection technique: LOR saline  Needle:  Needle type: Tuohy  Needle gauge: 17 G Needle length: 9 cm and 9 Needle insertion depth: 4 cm Catheter type: closed end flexible Catheter size: 20 Guage Catheter at skin depth: 9 cm Test dose: negative  Assessment Events: blood not aspirated, injection not painful, no injection resistance, negative IV test and no paresthesia  Additional Notes Patient identified. Risks/Benefits/Options discussed with patient including but not limited to bleeding, infection, nerve damage, paralysis, failed block, incomplete pain control, headache, blood pressure changes, nausea, vomiting, reactions to medication both or allergic, itching and postpartum back pain. Confirmed with bedside nurse the patient's most recent platelet count. Confirmed with patient that they are not currently taking any anticoagulation, have any bleeding history or any family history of bleeding disorders. Patient expressed understanding and wished to proceed. All questions were answered. Sterile technique was used throughout the entire procedure. Please see nursing notes for vital signs. Test dose was given through epidural needle and negative prior to continuing to dose epidural or start infusion. Warning signs of high block given to the patient including shortness of breath, tingling/numbness in hands, complete motor block, or any concerning symptoms with instructions to call for help. Patient was given  instructions on fall risk and not to get out of bed. All questions and concerns addressed with instructions to call with any issues.

## 2018-10-21 NOTE — Anesthesia Preprocedure Evaluation (Signed)

## 2018-10-22 LAB — CBC
HCT: 33.2 % — ABNORMAL LOW (ref 36.0–46.0)
Hemoglobin: 11.3 g/dL — ABNORMAL LOW (ref 12.0–15.0)
MCH: 32.7 pg (ref 26.0–34.0)
MCHC: 34 g/dL (ref 30.0–36.0)
MCV: 96 fL (ref 80.0–100.0)
Platelets: 214 10*3/uL (ref 150–400)
RBC: 3.46 MIL/uL — ABNORMAL LOW (ref 3.87–5.11)
RDW: 13 % (ref 11.5–15.5)
WBC: 9.7 10*3/uL (ref 4.0–10.5)
nRBC: 0 % (ref 0.0–0.2)

## 2018-10-22 MED ORDER — IBUPROFEN 600 MG PO TABS: 600.0000 mg | ORAL_TABLET | Freq: Four times a day (QID) | ORAL | 0 refills | Status: DC

## 2018-10-22 NOTE — Anesthesia Postprocedure Evaluation (Signed)
Anesthesia Post Note  Patient: Crystal Walters  Procedure(s) Performed: AN AD [place]     Patient location during evaluation: Mother Baby Anesthesia Type: Epidural Level of consciousness: awake and alert Pain management: pain level controlled Vital Signs Assessment: post-procedure vital signs reviewed and stable Respiratory status: spontaneous breathing, nonlabored ventilation and respiratory function stable Cardiovascular status: stable Postop Assessment: no headache, no backache, epidural receding, no apparent nausea or vomiting, patient able to bend at knees, adequate PO intake and able to ambulate Anesthetic complications: no    Last Vitals:  Vitals:   10/22/18 0023 10/22/18 0544  BP: 121/75 118/71  Pulse: 80 76  Resp: 18 18  Temp: 36.9 C 36.9 C  SpO2: 99% 100%    Last Pain:  Vitals:   10/22/18 0547  TempSrc:   PainSc: 7    Pain Goal:                   Jabier Mutton

## 2018-10-22 NOTE — Clinical Social Work Maternal (Signed)
CLINICAL SOCIAL WORK MATERNAL/CHILD NOTE  Patient Details  Name: Crystal Walters MRN: [id number] Date of Birth: 01/08/1996  Date:  04/30/18  Clinical Social Worker Initiating Note:  Madilyn Fireman, MSW, LCSW-A Date/Time: Initiated:  10/22/18/0927     Child's Name:  Crystal Walters   Biological Parents:  Mother, Father   Need for Interpreter:  None   Reason for Referral:  Current Substance Use/Substance Use During Pregnancy    Address:  [address]. [place] [address]    Phone number:  [phone] (home)     Additional phone number:   Household Members/Support Persons (HM/SP):   Household Member/Support Person 1, Household Member/Support Person 2   HM/SP Name Relationship DOB or Age  HM/SP -1 Byron Martenson FOB 27 years  HM/SP -2 [place] Son 4 years  HM/SP -3        HM/SP -4        HM/SP -5        HM/SP -6        HM/SP -7        HM/SP -8          Natural Supports (not living in the home):  Children   Professional Supports:     Employment: Unemployed   Type of Work:     Education:  Attending college   Homebound arranged:    Museum/gallery curator Resources:  Multimedia programmer   Other Resources:  Physicist, medical , [place] Considerations Which May Impact Care:  None  Strengths:  Ability to meet basic needs , Home prepared for child , Pediatrician chosen   Psychotropic Medications:         Pediatrician:    [place] area  Pediatrician List:   [facility] [place]      Pediatrician Fax Number:    Risk Factors/Current Problems:  None   Cognitive State:  Alert , Able to Concentrate    Mood/Affect:  Calm , Comfortable    CSW Assessment: CSW received consult for MOB due to her history of substance use during pregnancy, specifically marijuana. CSW met with MOB and newborn Addie at bedside to complete assessment. MOB  was receptive to [place] visiting and was pleasant throughout conversation. MOB reports this is her second child with her FOB, [place]. MOB reports that FOB is supportive and is involved in the care of the children. CSW explained to MOB about hospital drug screening policies and MOB states understanding and did not have any questions. CSW informed MOB that the UDS for the newborn was negative. MOB reports her last marijuana use was four months ago. MOB reports she lives with her FOB and children. MOB denies any prior CPS involvement. MOB denies any history of mental illness. MOB reports she has her on personal vehicle for transportation. MOB reports choosing [facility] for pediatric care. MOB reports she is attending college online at this time and she is unemployed. MOB reports receiving food stamps, Medicaid, and WIC. MOB is breastfeeding and not having any concerns at this time. MOB reports having a car seat with knowledge of installation and use. MOB is knowledgable about SIDS and safe sleeping. MOB reports infant will sleep in a bassinet at home.   CSW will continue to monitor cord results and will make CPS report if warranted.  CSW Plan/Description:  CSW Will Continue  to Monitor Umbilical Cord Tissue Drug Screen Results and Make Report if Earlie Counts, LCSW 10/22/2018, 10:18 AM

## 2018-10-22 NOTE — Discharge Instructions (Signed)
Postpartum Care After Vaginal Delivery °This sheet gives you information about how to care for yourself from the time you deliver your baby to up to 6-12 weeks after delivery (postpartum period). Your health care provider may also give you more specific instructions. If you have problems or questions, contact your health care provider. °Follow these instructions at home: °Vaginal bleeding °· It is normal to have vaginal bleeding (lochia) after delivery. Wear a sanitary pad for vaginal bleeding and discharge. °? During the first week after delivery, the amount and appearance of lochia is often similar to a menstrual period. °? Over the next few weeks, it will gradually decrease to a dry, yellow-brown discharge. °? For most women, lochia stops completely by 4-6 weeks after delivery. Vaginal bleeding can vary from woman to woman. °· Change your sanitary pads frequently. Watch for any changes in your flow, such as: °? A sudden increase in volume. °? A change in color. °? Large blood clots. °· If you pass a blood clot from your vagina, save it and call your health care provider to discuss. Do not flush blood clots down the toilet before talking with your health care provider. °· Do not use tampons or douches until your health care provider says this is safe. °· If you are not breastfeeding, your period should return 6-8 weeks after delivery. If you are feeding your child breast milk only (exclusive breastfeeding), your period may not return until you stop breastfeeding. °Perineal care °· Keep the area between the vagina and the anus (perineum) clean and dry as told by your health care provider. Use medicated pads and pain-relieving sprays and creams as directed. °· If you had a cut in the perineum (episiotomy) or a tear in the vagina, check the area for signs of infection until you are healed. Check for: °? More redness, swelling, or pain. °? Fluid or blood coming from the cut or tear. °? Warmth. °? Pus or a bad  smell. °· You may be given a squirt bottle to use instead of wiping to clean the perineum area after you go to the bathroom. As you start healing, you may use the squirt bottle before wiping yourself. Make sure to wipe gently. °· To relieve pain caused by an episiotomy, a tear in the vagina, or swollen veins in the anus (hemorrhoids), try taking a warm sitz bath 2-3 times a day. A sitz bath is a warm water bath that is taken while you are sitting down. The water should only come up to your hips and should cover your buttocks. °Breast care °· Within the first few days after delivery, your breasts may feel heavy, full, and uncomfortable (breast engorgement). Milk may also leak from your breasts. Your health care provider can suggest ways to help relieve the discomfort. Breast engorgement should go away within a few days. °· If you are breastfeeding: °? Wear a bra that supports your breasts and fits you well. °? Keep your nipples clean and dry. Apply creams and ointments as told by your health care provider. °? You may need to use breast pads to absorb milk that leaks from your breasts. °? You may have uterine contractions every time you breastfeed for up to several weeks after delivery. Uterine contractions help your uterus return to its normal size. °? If you have any problems with breastfeeding, work with your health care provider or lactation consultant. °· If you are not breastfeeding: °? Avoid touching your breasts a lot. Doing this can make   your breasts produce more milk. °? Wear a good-fitting bra and use cold packs to help with swelling. °? Do not squeeze out (express) milk. This causes you to make more milk. °Intimacy and sexuality °· Ask your health care provider when you can engage in sexual activity. This may depend on: °? Your risk of infection. °? How fast you are healing. °? Your comfort and desire to engage in sexual activity. °· You are able to get pregnant after delivery, even if you have not had  your period. If desired, talk with your health care provider about methods of birth control (contraception). °Medicines °· Take over-the-counter and prescription medicines only as told by your health care provider. °· If you were prescribed an antibiotic medicine, take it as told by your health care provider. Do not stop taking the antibiotic even if you start to feel better. °Activity °· Gradually return to your normal activities as told by your health care provider. Ask your health care provider what activities are safe for you. °· Rest as much as possible. Try to rest or take a nap while your baby is sleeping. °Eating and drinking ° °· Drink enough fluid to keep your urine pale yellow. °· Eat high-fiber foods every day. These may help prevent or relieve constipation. High-fiber foods include: °? Whole grain cereals and breads. °? Brown rice. °? Beans. °? Fresh fruits and vegetables. °· Do not try to lose weight quickly by cutting back on calories. °· Take your prenatal vitamins until your postpartum checkup or until your health care provider tells you it is okay to stop. °Lifestyle °· Do not use any products that contain nicotine or tobacco, such as cigarettes and e-cigarettes. If you need help quitting, ask your health care provider. °· Do not drink alcohol, especially if you are breastfeeding. °General instructions °· Keep all follow-up visits for you and your baby as told by your health care provider. Most women visit their health care provider for a postpartum checkup within the first 3-6 weeks after delivery. °Contact a health care provider if: °· You feel unable to cope with the changes that your child brings to your life, and these feelings do not go away. °· You feel unusually sad or worried. °· Your breasts become red, painful, or hard. °· You have a fever. °· You have trouble holding urine or keeping urine from leaking. °· You have little or no interest in activities you used to enjoy. °· You have not  breastfed at all and you have not had a menstrual period for 12 weeks after delivery. °· You have stopped breastfeeding and you have not had a menstrual period for 12 weeks after you stopped breastfeeding. °· You have questions about caring for yourself or your baby. °· You pass a blood clot from your vagina. °Get help right away if: °· You have chest pain. °· You have difficulty breathing. °· You have sudden, severe leg pain. °· You have severe pain or cramping in your lower abdomen. °· You bleed from your vagina so much that you fill more than one sanitary pad in one hour. Bleeding should not be heavier than your heaviest period. °· You develop a severe headache. °· You faint. °· You have blurred vision or spots in your vision. °· You have bad-smelling vaginal discharge. °· You have thoughts about hurting yourself or your baby. °If you ever feel like you may hurt yourself or others, or have thoughts about taking your own life, get help   right away. You can go to the nearest emergency department or call:  Your local emergency services (911 in the [place]  A suicide crisis helpline, such as the [facility] at 607-612-2455. This is open 24 hours a day. Summary  The period of time right after you deliver your newborn up to 6-12 weeks after delivery is called the postpartum period.  Gradually return to your normal activities as told by your health care provider.  Keep all follow-up visits for you and your baby as told by your health care provider. This information is not intended to replace advice given to you by your health care provider. Make sure you discuss any questions you have with your health care provider. Document Released: 11/08/2006 Document Revised: 01/14/2017 Document Reviewed: 10/25/2016 Elsevier Patient Education  [place] to have your son circumcised:    [facility]  [phone] $480 by 4 wks  [facility] [phone] $244 by 4 wks  [facility] [phone] $175 by 2 wks  [facility] [phone] $250 by 7 days [facility] [phone] $150 by 4 wks  These prices sometimes change but are roughly what you can expect to pay. Please call and confirm pricing.   Circumcision is considered an elective/non-medically necessary procedure. There are many reasons parents decide to have their sons circumsized. During the first year of life circumcised males have a reduced risk of urinary tract infections but after this year the rates between circumcised males and uncircumcised males are the same.  It is safe to have your son circumcised outside of the hospital and the places above perform them regularly.

## 2019-01-10 ENCOUNTER — Other Ambulatory Visit: Payer: Self-pay

## 2019-01-10 ENCOUNTER — Encounter (HOSPITAL_COMMUNITY): Payer: Self-pay

## 2019-01-10 ENCOUNTER — Emergency Department (HOSPITAL_COMMUNITY)
Admission: EM | Admit: 2019-01-10 | Discharge: 2019-01-11 | Discharge status: Against Medical Advice | Payer: BC Managed Care – PPO | Attending: Emergency Medicine | Admitting: Emergency Medicine

## 2019-01-10 DIAGNOSIS — Z87891 Personal history of nicotine dependence: Secondary | ICD-10-CM | POA: Diagnosis not present

## 2019-01-10 DIAGNOSIS — F1290 Cannabis use, unspecified, uncomplicated: Secondary | ICD-10-CM | POA: Insufficient documentation

## 2019-01-10 DIAGNOSIS — R112 Nausea with vomiting, unspecified: Secondary | ICD-10-CM | POA: Diagnosis present

## 2019-01-10 DIAGNOSIS — K2920 Alcoholic gastritis without bleeding: Secondary | ICD-10-CM | POA: Insufficient documentation

## 2019-01-10 DIAGNOSIS — Z79899 Other long term (current) drug therapy: Secondary | ICD-10-CM | POA: Insufficient documentation

## 2019-01-10 LAB — CBC
HCT: 39 % (ref 36.0–46.0)
Hemoglobin: 13.1 g/dL (ref 12.0–15.0)
MCH: 30.5 pg (ref 26.0–34.0)
MCHC: 33.6 g/dL (ref 30.0–36.0)
MCV: 90.9 fL (ref 80.0–100.0)
Platelets: 256 10*3/uL (ref 150–400)
RBC: 4.29 MIL/uL (ref 3.87–5.11)
RDW: 11.4 % — ABNORMAL LOW (ref 11.5–15.5)
WBC: 5.6 10*3/uL (ref 4.0–10.5)
nRBC: 0 % (ref 0.0–0.2)

## 2019-01-10 LAB — COMPREHENSIVE METABOLIC PANEL
ALT: 20 U/L (ref 0–44)
AST: 23 U/L (ref 15–41)
Albumin: 4.2 g/dL (ref 3.5–5.0)
Alkaline Phosphatase: 55 U/L (ref 38–126)
Anion gap: 9 (ref 5–15)
BUN: 10 mg/dL (ref 6–20)
CO2: 29 mmol/L (ref 22–32)
Calcium: 9.3 mg/dL (ref 8.9–10.3)
Chloride: 101 mmol/L (ref 98–111)
Creatinine, Ser: 1.01 mg/dL — ABNORMAL HIGH (ref 0.44–1.00)
GFR calc Af Amer: >60 mL/min (ref >60)
GFR calc non Af Amer: >60 mL/min (ref >60)
Glucose, Bld: 105 mg/dL — ABNORMAL HIGH (ref 70–99)
Potassium: 4.3 mmol/L (ref 3.5–5.1)
Sodium: 139 mmol/L (ref 135–145)
Total Bilirubin: 1 mg/dL (ref 0.3–1.2)
Total Protein: 7.1 g/dL (ref 6.5–8.1)

## 2019-01-10 LAB — URINALYSIS, ROUTINE W REFLEX MICROSCOPIC
Bilirubin Urine: NEGATIVE
Glucose, UA: NEGATIVE mg/dL
Ketones, ur: 20 mg/dL — AB
Leukocytes,Ua: NEGATIVE
Nitrite: NEGATIVE
Protein, ur: 100 mg/dL — AB
RBC / HPF: >50 RBC/hpf — ABNORMAL HIGH (ref 0–5)
Specific Gravity, Urine: 1.023 (ref 1.005–1.030)
pH: 6 (ref 5.0–8.0)

## 2019-01-10 LAB — I-STAT BETA HCG BLOOD, ED (MC, WL, AP ONLY): I-stat hCG, quantitative: <5 m[IU]/mL (ref <5)

## 2019-01-10 LAB — LIPASE, BLOOD: Lipase: 24 U/L (ref 11–51)

## 2019-01-10 NOTE — ED Triage Notes (Signed)
Pt arrives POV for eval of emesis, abd pain. Pt denies diarrhea. Pt states she is menstruating, states she does not think she is pregnant. Denies known covid contacts, states she does not think she is producing any urine yesterday and today

## 2019-01-11 ENCOUNTER — Encounter (HOSPITAL_COMMUNITY): Payer: Self-pay | Admitting: Emergency Medicine

## 2019-01-11 MED ORDER — ONDANSETRON 4 MG PO TBDP
4.0000 mg | ORAL_TABLET | Freq: Once | ORAL | Status: AC
  Administered 2019-01-11: 4 mg via ORAL
  Filled 2019-01-11: qty 1

## 2019-01-11 MED ORDER — PANTOPRAZOLE SODIUM 20 MG PO TBEC
20.0000 mg | DELAYED_RELEASE_TABLET | Freq: Once | ORAL | Status: AC
  Administered 2019-01-11: 20 mg via ORAL
  Filled 2019-01-11: qty 1

## 2019-01-11 MED ORDER — CAPSAICIN 0.025 % EX CREA
TOPICAL_CREAM | Freq: Once | CUTANEOUS | Status: AC
  Filled 2019-01-11: qty 60

## 2019-01-11 MED ORDER — HALOPERIDOL LACTATE 5 MG/ML IJ SOLN
2.0000 mg | Freq: Once | INTRAMUSCULAR | Status: AC
  Administered 2019-01-11: 2 mg via INTRAMUSCULAR
  Filled 2019-01-11: qty 1

## 2019-01-11 MED ORDER — SUCRALFATE 1 G PO TABS
1.0000 g | ORAL_TABLET | Freq: Once | ORAL | Status: AC
  Administered 2019-01-11: 01:00:00 1 g via ORAL
  Filled 2019-01-11: qty 1

## 2019-01-11 NOTE — ED Provider Notes (Signed)
[facility] EM[facility]ENCY [facility]PART[facility] Provider Note   CSN: [id number] Arrival date & time: 01/10/19  2006     History Chief Complaint  Patient presents with  . Emesis    Crystal Ciscolexis Schiltz is a 23 y.o. female with a past medical history of childhood seizures, depression, who presents today for evaluation of abdominal pain, nausea, and vomiting.  She reports that 1 week ago she got "blackout drunk" and smoked marijuana and since then she has had significant nausea and vomiting.  She reports mild diffuse abdominal pain.  She denies any fevers.  No dysuria, increased frequency or urgency.  She does report decreased urination.  She denies any significant diarrhea or constipation.  She reports she is currently on her menstrual cycle.  She states that she has been able to keep down water, chicken broth, and Pedialyte however has not been able to tolerate solid foods.  She has Zofran at home from when she was pregnant however she has not tried that.  She states that this feels like when she was pregnant and diagnosed with cannabinoid hyperemesis.  She notes that her symptoms are significantly reduced with hot showers, has taken multiple hot showers a day "until the hot water runs cold."  HPI     Past Medical History:  Diagnosis Date  . Chlamydia   . Depression   . Seizures (HCC)    childhood  . Vaginal Pap smear, abnormal     Patient Active Problem List   Diagnosis Date Noted  . Indication for care in labor or delivery 10/21/2018    Past Surgical History:  Procedure Laterality Date  . NO PAST SURGERIES       OB History    Gravida  2   Para  2   Term  2   Preterm      AB      Living  2     SAB      TAB      Ectopic      Multiple  0   Live Births  2           Family History  Problem Relation Age of Onset  . Hypertension Mother     Social History   Tobacco Use  . Smoking status: Former Games developermoker  . Smokeless tobacco: Never Used    Substance Use Topics  . Alcohol use: No  . Drug use: Not Currently    Types: Marijuana    Comment: marijuana- last use was April 2020    Home Medications Prior to Admission medications   Medication Sig Start Date End Date Taking? Authorizing Provider  ibuprofen (ADVIL) 600 MG tablet Take 1 tablet (600 mg total) by mouth every 6 (six) hours. 10/22/18   Donette LarryBhambri, Melanie, CNM  Prenatal Vit-Fe Fumarate-FA (PRENATAL MULTIVITAMIN) TABS tablet Take 1 tablet by mouth daily at 12 noon.    [provider]    Allergies    Patient has no known allergies.  Review of Systems   Review of Systems  Constitutional: Negative for chills and fever.  HENT: Negative for congestion.   Respiratory: Negative for cough, chest tightness and shortness of breath.   Cardiovascular: Negative for chest pain.  Gastrointestinal: Positive for abdominal pain, nausea and vomiting. Negative for constipation and diarrhea.  Genitourinary: Negative for dysuria and urgency.  Musculoskeletal: Negative for back pain and neck pain.  Skin: Negative for color change and rash.  Neurological: Negative for weakness and headaches.  All other  systems reviewed and are negative.   Physical Exam Updated Vital Signs BP (!) 142/100   Pulse (!) 55   Temp 98.5 F (36.9 C) (Oral)   Resp 16   Ht 5\' 7"  (1.702 m)   Wt 60.8 kg   SpO2 100%   BMI 20.99 kg/m   Physical Exam Vitals and nursing note reviewed.  Constitutional:      General: She is not in acute distress.    Appearance: She is well-developed. She is not diaphoretic.  HENT:     Head: Normocephalic and atraumatic.     Mouth/Throat:     Mouth: Mucous membranes are moist.  Eyes:     General: No scleral icterus.       Right eye: No discharge.        Left eye: No discharge.     Conjunctiva/sclera: Conjunctivae normal.  Cardiovascular:     Rate and Rhythm: Normal rate and regular rhythm.  Pulmonary:     Effort: Pulmonary effort is normal. No respiratory  distress.     Breath sounds: No stridor.  Abdominal:     General: Abdomen is flat. Bowel sounds are normal. There is no distension.     Palpations: Abdomen is soft.     Tenderness: There is no abdominal tenderness.  Musculoskeletal:        General: No deformity.     Cervical back: Normal range of motion and neck supple.  Skin:    General: Skin is warm and dry.  Neurological:     General: No focal deficit present.     Mental Status: She is alert.     Cranial Nerves: No cranial nerve deficit.     Motor: No abnormal muscle tone.  Psychiatric:        Mood and Affect: Mood normal.        Behavior: Behavior normal.     ED Results / Procedures / Treatments   Labs (all labs ordered are listed, but only abnormal results are displayed) Labs Reviewed  COMPREHENSIVE METABOLIC PANEL - Abnormal; Notable for the following components:      Result Value   Glucose, Bld 105 (*)    Creatinine, Ser 1.01 (*)    All other components within normal limits  CBC - Abnormal; Notable for the following components:   RDW 11.4 (*)    All other components within normal limits  URINALYSIS, ROUTINE W REFLEX MICROSCOPIC - Abnormal; Notable for the following components:   Color, Urine AMBER (*)    APPearance HAZY (*)    Hgb urine dipstick LARGE (*)    Ketones, ur 20 (*)    Protein, ur 100 (*)    RBC / HPF >50 (*)    Bacteria, UA RARE (*)    All other components within normal limits  LIPASE, BLOOD  I-STAT BETA HCG BLOOD, ED ([facility], [facility], [facility] ONLY)    EKG None  Radiology No results found.  Procedures Procedures (including critical care time)  Medications Ordered in ED Medications  haloperidol lactate (HALDOL) injection 2 mg (2 mg Intramuscular Given 01/11/19 0113)  capsaicin (ZOSTRIX) 0.025 % cream ( Topical Given 01/11/19 0114)  sucralfate (CARAFATE) tablet 1 g (1 g Oral Given 01/11/19 0113)  pantoprazole (PROTONIX) EC tablet 20 mg (20 mg Oral Given 01/11/19 0114)  ondansetron (ZOFRAN-ODT)  disintegrating tablet 4 mg (4 mg Oral Given 01/11/19 0114)    ED Course  I have reviewed the triage vital signs and the nursing notes.  Pertinent labs &  imaging results that were available during my care of the patient were reviewed by me and considered in my medical decision making (see chart for details).    MDM Rules/Calculators/A&P                     Patient presents today for evaluation of approximately 1 week of generalized abdominal pain, nausea, and vomiting.  This started after she consumed a large amount of alcohol and used cannabis twice.  She has previously been diagnosed with cannabinoid hyperemesis syndrome.  On exam she is generally well-appearing she does not have any significant abdominal tenderness.  No peritoneal signs.  CBC and CMP are reassuring, creatinine is slightly elevated at 1.01 consistent with mild dehydration.  Pregnancy test is negative.  Lipase is not elevated.  UA shows over 50 reds, 21-50 whites with rare bacteria, patient is currently menstruating and I suspect this is menstrual contamination as she denies any urinary symptoms.  She was treated with IM Haldol, p.o. Carafate, Protonix, Zofran and topical capsaicin applied to her abdomen.  I plan to reevaluate her 1 hour after interventions, however I was informed that patient left prior to my being able to reevaluate her.  She did not give notice of her plan to leave, and based on this choice I was unable to discuss the possible risks of this decision or reevaluate her.   Final Clinical Impression(s) / ED Diagnoses Final diagnoses:  Cannabinoid hyperemesis syndrome  Acute alcoholic gastritis without hemorrhage    Rx / DC Orders ED Discharge Orders    None       Ollen Gross 01/11/19 0228    Palumbo, April, MD 01/11/19 0234

## 2019-01-11 NOTE — ED Notes (Signed)
Notified by ED tech, Randall Hiss that pt was seen leaving department. ED tech stated he asked if pt would stay to talk to EDP and pt refused.

## 2019-01-11 NOTE — ED Notes (Signed)
Pt left without notifying RN. Could not have pt sign AMA papers.

## 2019-02-23 ENCOUNTER — Ambulatory Visit (HOSPITAL_COMMUNITY)
Admission: EM | Admit: 2019-02-23 | Discharge: 2019-02-23 | Disposition: A | Discharge status: Home / Self Care | Payer: BC Managed Care – PPO | Attending: Urgent Care | Admitting: Urgent Care

## 2019-02-23 ENCOUNTER — Encounter (HOSPITAL_COMMUNITY): Payer: Self-pay

## 2019-02-23 ENCOUNTER — Other Ambulatory Visit: Payer: Self-pay

## 2019-02-23 DIAGNOSIS — Z3202 Encounter for pregnancy test, result negative: Secondary | ICD-10-CM | POA: Diagnosis not present

## 2019-02-23 DIAGNOSIS — Z113 Encounter for screening for infections with a predominantly sexual mode of transmission: Secondary | ICD-10-CM | POA: Insufficient documentation

## 2019-02-23 DIAGNOSIS — Z8249 Family history of ischemic heart disease and other diseases of the circulatory system: Secondary | ICD-10-CM | POA: Diagnosis not present

## 2019-02-23 DIAGNOSIS — Z87891 Personal history of nicotine dependence: Secondary | ICD-10-CM | POA: Insufficient documentation

## 2019-02-23 DIAGNOSIS — Z20822 Contact with and (suspected) exposure to covid-19: Secondary | ICD-10-CM | POA: Diagnosis not present

## 2019-02-23 DIAGNOSIS — N3000 Acute cystitis without hematuria: Secondary | ICD-10-CM | POA: Diagnosis not present

## 2019-02-23 DIAGNOSIS — F1290 Cannabis use, unspecified, uncomplicated: Secondary | ICD-10-CM

## 2019-02-23 DIAGNOSIS — R109 Unspecified abdominal pain: Secondary | ICD-10-CM | POA: Insufficient documentation

## 2019-02-23 DIAGNOSIS — R112 Nausea with vomiting, unspecified: Secondary | ICD-10-CM

## 2019-02-23 DIAGNOSIS — R1110 Vomiting, unspecified: Secondary | ICD-10-CM | POA: Diagnosis present

## 2019-02-23 LAB — POCT URINALYSIS DIP (DEVICE)
Bilirubin Urine: NEGATIVE
Glucose, UA: NEGATIVE mg/dL
Hgb urine dipstick: NEGATIVE
Ketones, ur: 40 mg/dL — AB
Nitrite: POSITIVE — AB
Protein, ur: NEGATIVE mg/dL
Specific Gravity, Urine: 1.015 (ref 1.005–1.030)
Urobilinogen, UA: 2 mg/dL — ABNORMAL HIGH (ref 0.0–1.0)
pH: 7.5 (ref 5.0–8.0)

## 2019-02-23 LAB — POC URINE PREG, ED: Preg Test, Ur: NEGATIVE

## 2019-02-23 LAB — POCT PREGNANCY, URINE: Preg Test, Ur: NEGATIVE

## 2019-02-23 MED ORDER — OMEPRAZOLE 20 MG PO CPDR: 20.0000 mg | DELAYED_RELEASE_CAPSULE | Freq: Every day | ORAL | 0 refills | Status: DC

## 2019-02-23 MED ORDER — NITROFURANTOIN MONOHYD MACRO 100 MG PO CAPS: 100.0000 mg | ORAL_CAPSULE | Freq: Two times a day (BID) | ORAL | 0 refills | Status: AC

## 2019-02-23 MED ORDER — ONDANSETRON 4 MG PO TBDP: 4.0000 mg | ORAL_TABLET | Freq: Three times a day (TID) | ORAL | 0 refills | Status: DC | PRN

## 2019-02-23 NOTE — ED Notes (Signed)
Pt returned after being discharged requesting a covid test. Pt has no sx or exposures. Pt swabbed and re-discharged. Self-isolation education provided.

## 2019-02-23 NOTE — ED Provider Notes (Addendum)
[facility]    CSN: [id number] Arrival date & time: 02/23/19  1733      History   Chief Complaint Chief Complaint  Patient presents with  . Vomiting  . STD Testing    HPI Crystal Walters is a 24 y.o. female.   Crystal Walters presents with complaints of frequent emesis for the past week approximately. Occasional stomach pain. Started the day following excessive intake of alcohol. Couldn't eat for a few days following this episode. Still drinking fluids now and taking broth and soup. Vomited once today. No fevers. No dizziness. Used promethazine which she has been given in the past, two days ago, which did help. No other URI symptoms. She smokes marijuana daily. Nausea and vomiting improves with hot showers. History of cannabinoid hyperemesis gravidum. Patient states has been told in the past to quit using marijuana but states that it helps her focus on her school work. Smoked last this morning. She states she is worried there is something else as source of her nausea. No back pain, no urinary symptoms. Requesting STD screening. No known exposures, no vaginal discharge or bleeding. LMP 1/12.     ROS per HPI, negative if not otherwise mentioned.      Past Medical History:  Diagnosis Date  . Chlamydia   . Depression   . Seizures (HCC)    childhood  . Vaginal Pap smear, abnormal     Patient Active Problem List   Diagnosis Date Noted  . Indication for care in labor or delivery 10/21/2018    Past Surgical History:  Procedure Laterality Date  . NO PAST SURGERIES      OB History    Gravida  2   Para  2   Term  2   Preterm      AB      Living  2     SAB      TAB      Ectopic      Multiple  0   Live Births  2            Home Medications    Prior to Admission medications   Medication Sig Start Date End Date Taking? Authorizing Provider  ibuprofen (ADVIL) 600 MG tablet Take 1 tablet (600 mg total) by mouth every 6 (six) hours. 10/22/18    Donette Larry, CNM  nitrofurantoin, macrocrystal-monohydrate, (MACROBID) 100 MG capsule Take 1 capsule (100 mg total) by mouth 2 (two) times daily for 5 days. 02/23/19 02/28/19  Georgetta Haber, NP  omeprazole (PRILOSEC) 20 MG capsule Take 1 capsule (20 mg total) by mouth daily. 02/23/19   Georgetta Haber, NP  ondansetron (ZOFRAN-ODT) 4 MG disintegrating tablet Take 1 tablet (4 mg total) by mouth every 8 (eight) hours as needed for nausea or vomiting. 02/23/19   Georgetta Haber, NP  Prenatal Vit-Fe Fumarate-FA (PRENATAL MULTIVITAMIN) TABS tablet Take 1 tablet by mouth daily at 12 noon.    [provider]    Family History Family History  Problem Relation Age of Onset  . Hypertension Mother     Social History Social History   Tobacco Use  . Smoking status: Former Games developer  . Smokeless tobacco: Never Used  Substance Use Topics  . Alcohol use: No  . Drug use: Not Currently    Types: Marijuana    Comment: marijuana- last use was April 2020     Allergies   Patient has no known allergies.   Review of Systems  Review of Systems   Physical Exam Triage Vital Signs ED Triage Vitals  Enc Vitals Group     BP 02/23/19 1832 (!) 140/91     Pulse Rate 02/23/19 1832 86     Resp 02/23/19 1832 18     Temp 02/23/19 1832 98.2 F (36.8 C)     Temp Source 02/23/19 1832 Oral     SpO2 02/23/19 1832 99 %     Weight --      Height --      Head Circumference --      Peak Flow --      Pain Score 02/23/19 1754 3     Pain Loc --      Pain Edu? --      Excl. in GC? --    No data found.  Updated Vital Signs BP (!) 140/91 (BP Location: Left Arm)   Pulse 86   Temp 98.2 F (36.8 C) (Oral)   Resp 18   LMP 02/06/2019   SpO2 99%    Physical Exam Constitutional:      General: She is not in acute distress.    Appearance: She is well-developed.  Cardiovascular:     Rate and Rhythm: Normal rate.  Pulmonary:     Effort: Pulmonary effort is normal.  Abdominal:     General:  There is no distension.     Tenderness: There is no abdominal tenderness.  Skin:    General: Skin is warm and dry.  Neurological:     Mental Status: She is alert and oriented to person, place, and time.      UC Treatments / Results  Labs (all labs ordered are listed, but only abnormal results are displayed) Labs Reviewed  POCT URINALYSIS DIP (DEVICE) - Abnormal; Notable for the following components:      Result Value   Ketones, ur 40 (*)    Urobilinogen, UA 2.0 (*)    Nitrite POSITIVE (*)    Leukocytes,Ua TRACE (*)    All other components within normal limits  NOVEL CORONAVIRUS, NAA (HOSP ORDER, SEND-OUT TO REF LAB; TAT 18-24 HRS)  POC URINE PREG, ED  POC URINE PREG, ED  POCT PREGNANCY, URINE  CERVICOVAGINAL ANCILLARY ONLY    EKG   Radiology No results found.  Procedures Procedures (including critical care time)  Medications Ordered in UC Medications - No data to display  Initial Impression / Assessment and Plan / UC Course  I have reviewed the triage vital signs and the nursing notes.  Pertinent labs & imaging results that were available during my care of the patient were reviewed by me and considered in my medical decision making (see chart for details).     Vaginal cytology obtained and pending, no known std exposures and no current symptoms. Urine with nitrite and leuks with macrobid initiated. No active vomiting. No abdominal pain. Vitals stable. Regular and frequent marijuana use. High suspect that this is still cause of emesis. Discussed this with patient at length and encouraged discontinuation. If symptoms worsen or do not improve in the next week to return to be seen or to follow up with PCP.  Patient verbalized understanding and agreeable to plan.   Final Clinical Impressions(s) / UC Diagnoses   Final diagnoses:  Cannabinoid hyperemesis syndrome  Screen for STD (sexually transmitted disease)  Acute cystitis without hematuria     Discharge  Instructions     Your urine is consistent with UTI, please complete course of antibiotics.  Zofran  every 8 hours as needed for nausea or vomiting.  Will notify you of any positive findings from your vaginal swab and if any changes to treatment are needed.   Your vomiting is likely related to your frequent and regular use of marijuana, as well as alcohol intake.  Please decrease to quit.  Daily omeprazole.  If symptoms worsen or do not improve in the next week to return to be seen or to follow up with your PCP.      ED Prescriptions    Medication Sig Dispense Auth. Provider   ondansetron (ZOFRAN-ODT) 4 MG disintegrating tablet Take 1 tablet (4 mg total) by mouth every 8 (eight) hours as needed for nausea or vomiting. 12 tablet Augusto Gamble B, NP   nitrofurantoin, macrocrystal-monohydrate, (MACROBID) 100 MG capsule Take 1 capsule (100 mg total) by mouth 2 (two) times daily for 5 days. 10 capsule Augusto Gamble B, NP   omeprazole (PRILOSEC) 20 MG capsule Take 1 capsule (20 mg total) by mouth daily. 30 capsule Zigmund Gottron, NP     PDMP not reviewed this encounter.   Zigmund Gottron, NP 02/23/19 1908    Zigmund Gottron, NP 02/23/19 1909

## 2019-02-23 NOTE — ED Triage Notes (Signed)
Pt presents with vomiting X 1 week.  Pt also presents for STD Testing; pt states she is not having any symptoms.

## 2019-02-23 NOTE — Discharge Instructions (Signed)
Your urine is consistent with UTI, please complete course of antibiotics.  Zofran every 8 hours as needed for nausea or vomiting.  Will notify you of any positive findings from your vaginal swab and if any changes to treatment are needed.   Your vomiting is likely related to your frequent and regular use of marijuana, as well as alcohol intake.  Please decrease to quit.  Daily omeprazole.  If symptoms worsen or do not improve in the next week to return to be seen or to follow up with your PCP.

## 2019-02-25 LAB — NOVEL CORONAVIRUS, NAA (HOSP ORDER, SEND-OUT TO REF LAB; TAT 18-24 HRS): SARS-CoV-2, NAA: NOT DETECTED

## 2019-02-27 ENCOUNTER — Ambulatory Visit (HOSPITAL_COMMUNITY)
Admission: EM | Admit: 2019-02-27 | Discharge: 2019-02-27 | Discharge status: Against Medical Advice | Payer: BC Managed Care – PPO

## 2019-02-27 ENCOUNTER — Other Ambulatory Visit: Payer: Self-pay

## 2019-02-27 ENCOUNTER — Telehealth (HOSPITAL_COMMUNITY): Payer: Self-pay | Admitting: Emergency Medicine

## 2019-02-27 LAB — CERVICOVAGINAL ANCILLARY ONLY
Bacterial vaginitis: POSITIVE — AB
Candida vaginitis: NEGATIVE
Chlamydia: NEGATIVE
Neisseria Gonorrhea: NEGATIVE
Trichomonas: NEGATIVE

## 2019-02-27 MED ORDER — METRONIDAZOLE 500 MG PO TABS: 500.0000 mg | ORAL_TABLET | Freq: Two times a day (BID) | ORAL | 0 refills | Status: AC

## 2019-02-27 NOTE — Telephone Encounter (Signed)
Bacterial vaginosis is positive. Pt needs treatment. Flagyl 500 mg BID x 7 days #14 no refills sent to patients pharmacy of choice.    Attempted to reach patient. No answer at this time. Voicemail left.     

## 2019-02-27 NOTE — ED Notes (Signed)
Contacted pt regarding her labs, and she stated she would assume that her symptoms are from the BV. I sent the medicine to the pharmacy. Pt will return if symptoms are not better after completing the medicine.

## 2019-02-27 NOTE — Telephone Encounter (Signed)
Patient contacted by phone and made aware of  bv  results. Pt verbalized understanding and had all questions answered.    

## 2019-03-22 ENCOUNTER — Other Ambulatory Visit: Payer: Self-pay

## 2019-03-22 ENCOUNTER — Encounter (HOSPITAL_COMMUNITY): Payer: Self-pay

## 2019-03-22 ENCOUNTER — Ambulatory Visit (HOSPITAL_COMMUNITY)
Admission: EM | Admit: 2019-03-22 | Discharge: 2019-03-22 | Disposition: A | Discharge status: Home / Self Care | Payer: BC Managed Care – PPO | Attending: Family Medicine | Admitting: Family Medicine

## 2019-03-22 DIAGNOSIS — N760 Acute vaginitis: Secondary | ICD-10-CM | POA: Diagnosis not present

## 2019-03-22 DIAGNOSIS — Z3202 Encounter for pregnancy test, result negative: Secondary | ICD-10-CM | POA: Diagnosis not present

## 2019-03-22 LAB — POC URINE PREG, ED: Preg Test, Ur: NEGATIVE

## 2019-03-22 LAB — POCT PREGNANCY, URINE: Preg Test, Ur: NEGATIVE

## 2019-03-22 MED ORDER — FLUCONAZOLE 150 MG PO TABS: 150.0000 mg | ORAL_TABLET | Freq: Once | ORAL | 0 refills | Status: AC

## 2019-03-22 NOTE — ED Provider Notes (Signed)
[facility]    CSN: [id number] Arrival date & time: 03/22/19  1235      History   Chief Complaint Chief Complaint  Patient presents with  . Vaginitis    HPI Crystal Walters is a 24 y.o. female presenting today for evaluation of vaginal irritation.  Patient states that over the past 3 days she has felt very irritated and swollen in her genital area.  She denies significant discharge.  Feels similar to prior yeast infections.  She denies any urinary symptoms of dysuria, increased frequency or urgency.  She denies any fevers, nausea, vomiting, abdominal pain.  Last menstrual cycle was 2/12-2/16.  She is not on any form of birth control.  Denies recently being sexually active in the past few weeks and denies specific concerns for STDs.  She recently was treated for UTI and BV with antibiotics and symptoms have begun since.  HPI  Past Medical History:  Diagnosis Date  . Chlamydia   . Depression   . Seizures ([place])    childhood  . Vaginal Pap smear, abnormal     Patient Active Problem List   Diagnosis Date Noted  . Indication for care in labor or delivery 10/21/2018    Past Surgical History:  Procedure Laterality Date  . NO PAST SURGERIES      OB History    Gravida  2   Para  2   Term  2   Preterm      AB      Living  2     SAB      TAB      Ectopic      Multiple  0   Live Births  2            Home Medications    Prior to Admission medications   Medication Sig Start Date End Date Taking? Authorizing Provider  fluconazole (DIFLUCAN) 150 MG tablet Take 1 tablet (150 mg total) by mouth once for 1 dose. 03/22/19 03/22/19  Ayansh Feutz C, PA-C  omeprazole (PRILOSEC) 20 MG capsule Take 1 capsule (20 mg total) by mouth daily. 02/23/19 03/22/19  Zigmund Gottron, NP    Family History Family History  Problem Relation Age of Onset  . Hypertension Mother     Social History Social History   Tobacco Use  . Smoking status: Former Research scientist (life sciences)  .  Smokeless tobacco: Never Used  Substance Use Topics  . Alcohol use: No  . Drug use: Not Currently    Types: Marijuana    Comment: marijuana- last use was April 2020     Allergies   Patient has no known allergies.   Review of Systems Review of Systems  Constitutional: Negative for fever.  Respiratory: Negative for shortness of breath.   Cardiovascular: Negative for chest pain.  Gastrointestinal: Negative for abdominal pain, diarrhea, nausea and vomiting.  Genitourinary: Negative for dysuria, flank pain, genital sores, hematuria, menstrual problem, vaginal bleeding, vaginal discharge and vaginal pain.       Vaginal irritation  Musculoskeletal: Negative for back pain.  Skin: Negative for rash.  Neurological: Negative for dizziness, light-headedness and headaches.     Physical Exam Triage Vital Signs ED Triage Vitals  Enc Vitals Group     BP 03/22/19 1303 126/69     Pulse Rate 03/22/19 1303 83     Resp 03/22/19 1303 18     Temp 03/22/19 1303 98.2 F (36.8 C)     Temp Source 03/22/19 1303 Oral  SpO2 03/22/19 1303 100 %     Weight --      Height --      Head Circumference --      Peak Flow --      Pain Score 03/22/19 1308 4     Pain Loc --      Pain Edu? --      Excl. in GC? --    No data found.  Updated Vital Signs BP 126/69 (BP Location: Right Arm)   Pulse 83   Temp 98.2 F (36.8 C) (Oral)   Resp 18   LMP 03/09/2019   SpO2 100%   Visual Acuity Right Eye Distance:   Left Eye Distance:   Bilateral Distance:    Right Eye Near:   Left Eye Near:    Bilateral Near:     Physical Exam Vitals and nursing note reviewed.  Constitutional:      Appearance: She is well-developed.     Comments: No acute distress  HENT:     Head: Normocephalic and atraumatic.     Nose: Nose normal.  Eyes:     Conjunctiva/sclera: Conjunctivae normal.  Cardiovascular:     Rate and Rhythm: Normal rate.  Pulmonary:     Effort: Pulmonary effort is normal. No respiratory  distress.  Abdominal:     General: There is no distension.     Comments: Soft, nondistended, non tender to palpation throughout  Genitourinary:    Comments: deferred Musculoskeletal:        General: Normal range of motion.     Cervical back: Neck supple.  Skin:    General: Skin is warm and dry.  Neurological:     Mental Status: She is alert and oriented to person, place, and time.      UC Treatments / Results  Labs (all labs ordered are listed, but only abnormal results are displayed) Labs Reviewed  POC URINE PREG, ED  POCT PREGNANCY, URINE    EKG   Radiology No results found.  Procedures Procedures (including critical care time)  Medications Ordered in UC Medications - No data to display  Initial Impression / Assessment and Plan / UC Course  I have reviewed the triage vital signs and the nursing notes.  Pertinent labs & imaging results that were available during my care of the patient were reviewed by me and considered in my medical decision making (see chart for details).     Symptoms suggestive of likely vaginitis, likely yeast given recent antibiotic use.  Has not been sexually active since prior vaginal swab.  Empirically treating today with Diflucan.  Patient wished to defer testing for now.  Pregnancy test was negative.  Return if symptoms not resolving or changing.  Discussed strict return precautions. Patient verbalized understanding and is agreeable with plan.  Final Clinical Impressions(s) / UC Diagnoses   Final diagnoses:  Vaginitis and vulvovaginitis     Discharge Instructions     Take 1 tab dilfucan today, may repeat sun/Monday if still having symptoms Follow up if not improving or changing   ED Prescriptions    Medication Sig Dispense Auth. Provider   fluconazole (DIFLUCAN) 150 MG tablet Take 1 tablet (150 mg total) by mouth once for 1 dose. 2 tablet Nailani Full, [place] C, PA-C     PDMP not reviewed this encounter.   Lew Dawes,  PA-C 03/22/19 1351

## 2019-03-22 NOTE — Discharge Instructions (Signed)
Take 1 tab dilfucan today, may repeat sun/Monday if still having symptoms Follow up if not improving or changing

## 2019-03-22 NOTE — ED Triage Notes (Signed)
Pt presents with vaginal irritation the past few days.

## 2019-04-12 ENCOUNTER — Other Ambulatory Visit: Payer: Self-pay

## 2019-04-12 ENCOUNTER — Encounter (HOSPITAL_COMMUNITY): Payer: Self-pay

## 2019-04-12 ENCOUNTER — Ambulatory Visit (HOSPITAL_COMMUNITY)
Admission: EM | Admit: 2019-04-12 | Discharge: 2019-04-12 | Disposition: A | Discharge status: Home / Self Care | Payer: Medicaid Other

## 2019-04-12 DIAGNOSIS — M542 Cervicalgia: Secondary | ICD-10-CM

## 2019-04-12 DIAGNOSIS — G44209 Tension-type headache, unspecified, not intractable: Secondary | ICD-10-CM

## 2019-04-12 DIAGNOSIS — Z8669 Personal history of other diseases of the nervous system and sense organs: Secondary | ICD-10-CM

## 2019-04-12 DIAGNOSIS — S39012A Strain of muscle, fascia and tendon of lower back, initial encounter: Secondary | ICD-10-CM | POA: Diagnosis not present

## 2019-04-12 DIAGNOSIS — S161XXA Strain of muscle, fascia and tendon at neck level, initial encounter: Secondary | ICD-10-CM

## 2019-04-12 MED ORDER — TIZANIDINE HCL 4 MG PO TABS: 4.0000 mg | ORAL_TABLET | Freq: Three times a day (TID) | ORAL | 0 refills | Status: DC | PRN

## 2019-04-12 MED ORDER — NAPROXEN 500 MG PO TABS: 500.0000 mg | ORAL_TABLET | Freq: Two times a day (BID) | ORAL | 0 refills | Status: DC
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
## 2019-06-28 ENCOUNTER — Ambulatory Visit (HOSPITAL_COMMUNITY)
Admission: EM | Admit: 2019-06-28 | Discharge: 2019-06-28 | Disposition: A | Discharge status: Home / Self Care | Payer: Medicaid Other | Attending: Internal Medicine | Admitting: Internal Medicine

## 2019-06-28 ENCOUNTER — Encounter (HOSPITAL_COMMUNITY): Payer: Self-pay

## 2019-06-28 ENCOUNTER — Other Ambulatory Visit: Payer: Self-pay

## 2019-06-28 DIAGNOSIS — N898 Other specified noninflammatory disorders of vagina: Secondary | ICD-10-CM | POA: Diagnosis present

## 2019-06-28 LAB — POCT URINALYSIS DIP (DEVICE)
Bilirubin Urine: NEGATIVE
Glucose, UA: NEGATIVE mg/dL
Ketones, ur: NEGATIVE mg/dL
Leukocytes,Ua: NEGATIVE
Nitrite: NEGATIVE
Protein, ur: NEGATIVE mg/dL
Specific Gravity, Urine: 1.025 (ref 1.005–1.030)
Urobilinogen, UA: 0.2 mg/dL (ref 0.0–1.0)
pH: 7 (ref 5.0–8.0)

## 2019-06-28 LAB — POC URINE PREG, ED: Preg Test, Ur: NEGATIVE

## 2019-06-28 MED ORDER — FLUCONAZOLE 150 MG PO TABS: 150.0000 mg | ORAL_TABLET | Freq: Once | ORAL | 0 refills | Status: AC

## 2019-06-28 NOTE — Discharge Instructions (Addendum)
I have sent in diflucan to your pharmacy  You may also take baking soda baths  You may use Boric Acid capsules vaginally once daily for a month at the beginning, after the month, you may use one capsule weekly  Follow up as needed

## 2019-06-28 NOTE — ED Provider Notes (Signed)
[facility]   [id number] 06/28/19 Arrival Time: 6789   CC: VAGINAL DISCHARGE  SUBJECTIVE:  Crystal Walters is a 24 y.o. female who presents with complaints of gradual vaginal discharge that began 3 days ago. She denies a precipitating event, recent sexual encounter or recent antibiotic use. Patient is currently on menstrual cycle. Describes discharge as thick white. She has not tried OTC medications for this. There are no aggravating symptoms. She denies fever, chills, nausea, vomiting, abdominal or pelvic pain, urinary symptoms, vaginal odor, vaginal bleeding, dyspareunia, vaginal rashes or lesions.   No LMP recorded (lmp unknown). Current birth control method: Depo     Compliant with BC: yes  ROS: As per HPI.  All other pertinent ROS negative.     Past Medical History:  Diagnosis Date  . Chlamydia   . Depression   . Seizures ([place])    childhood  . Vaginal Pap smear, abnormal    Past Surgical History:  Procedure Laterality Date  . NO PAST SURGERIES     No Known Allergies No current facility-administered medications on file prior to encounter.   Current Outpatient Medications on File Prior to Encounter  Medication Sig Dispense Refill  . [DISCONTINUED] omeprazole (PRILOSEC) 20 MG capsule Take 1 capsule (20 mg total) by mouth daily. 30 capsule 0    Social History   Socioeconomic History  . Marital status: Single    Spouse name: Not on file  . Number of children: Not on file  . Years of education: Not on file  . Highest education level: Not on file  Occupational History  . Not on file  Tobacco Use  . Smoking status: Former Research scientist (life sciences)  . Smokeless tobacco: Never Used  Substance and Sexual Activity  . Alcohol use: No  . Drug use: Not Currently    Types: Marijuana    Comment: marijuana- last use was April 2020  . Sexual activity: Yes    Birth control/protection: None  Other Topics Concern  . Not on file  Social History Narrative  . Not on file   Social  Determinants of Health   Financial Resource Strain: Low Risk   . Difficulty of Paying Living Expenses: Not hard at all  Food Insecurity: No Food Insecurity  . Worried About Charity fundraiser in the Last Year: Never true  . Ran Out of Food in the Last Year: Never true  Transportation Needs: No Transportation Needs  . Lack of Transportation (Medical): No  . Lack of Transportation (Non-Medical): No  Physical Activity:   . Days of Exercise per Week:   . Minutes of Exercise per Session:   Stress:   . Feeling of Stress :   Social Connections:   . Frequency of Communication with Friends and Family:   . Frequency of Social Gatherings with Friends and Family:   . Attends Religious Services:   . Active Member of Clubs or Organizations:   . Attends Archivist Meetings:   Marland Kitchen Marital Status:   Intimate Partner Violence:   . Fear of Current or Ex-Partner:   . Emotionally Abused:   Marland Kitchen Physically Abused:   . Sexually Abused:    Family History  Problem Relation Age of Onset  . Hypertension Mother     OBJECTIVE:  Vitals:   06/28/19 1453  BP: 116/81  Pulse: 94  Resp: 16  Temp: 97.8 F (36.6 C)  SpO2: 100%     General appearance: Alert, NAD, appears stated age Head: NCAT Throat: lips,  mucosa, and tongue normal; teeth and gums normal Lungs: CTA bilaterally without adventitious breath sounds Heart: regular rate and rhythm.  Radial pulses 2+ symmetrical bilaterally Back: no CVA tenderness Abdomen: soft, non-tender; bowel sounds normal; no masses or organomegaly; no guarding or rebound tenderness GU: declines, self swab obtained Skin: warm and dry Psychological:  Alert and cooperative. Normal mood and affect.  LABS:  Results for orders placed or performed during the hospital encounter of 06/28/19  POC urine pregnancy  Result Value Ref Range   Preg Test, Ur NEGATIVE NEGATIVE  POCT urinalysis dip (device)  Result Value Ref Range   Glucose, UA NEGATIVE NEGATIVE mg/dL    Bilirubin Urine NEGATIVE NEGATIVE   Ketones, ur NEGATIVE NEGATIVE mg/dL   Specific Gravity, Urine 1.025 1.005 - 1.030   Hgb urine dipstick MODERATE (A) NEGATIVE   pH 7.0 5.0 - 8.0   Protein, ur NEGATIVE NEGATIVE mg/dL   Urobilinogen, UA 0.2 0.0 - 1.0 mg/dL   Nitrite NEGATIVE NEGATIVE   Leukocytes,Ua NEGATIVE NEGATIVE    Labs Reviewed  POCT URINALYSIS DIP (DEVICE) - Abnormal; Notable for the following components:      Result Value   Hgb urine dipstick MODERATE (*)    All other components within normal limits  POC URINE PREG, ED  CERVICOVAGINAL ANCILLARY ONLY    ASSESSMENT & PLAN:  1. Vaginal irritation   2. Vaginal itching     Meds ordered this encounter  Medications  . fluconazole (DIFLUCAN) 150 MG tablet    Sig: Take 1 tablet (150 mg total) by mouth once for 1 dose.    Dispense:  2 tablet    Refill:  0    Order Specific Question:   Supervising Provider    Answer:   Merrilee Jansky [[id number]]    Pending: Labs Reviewed  POCT URINALYSIS DIP (DEVICE) - Abnormal; Notable for the following components:      Result Value   Hgb urine dipstick MODERATE (*)    All other components within normal limits  POC URINE PREG, ED  CERVICOVAGINAL ANCILLARY ONLY     Vaginal discharge Vagina itching Likely yeast Prescribed diflucan Vaginal self-swab obtained.   We will follow up with you regarding abnormal results Take medications as prescribed and to completion If tests results are positive, please abstain from sexual activity until you and your partner(s) have been treated Follow up with PCP or [facility] if symptoms persists Return here or go to ER if you have any new or worsening symptoms fever, chills, nausea, vomiting, abdominal or pelvic pain, painful intercourse, vaginal discharge, vaginal bleeding, persistent symptoms despite treatment Reviewed expectations re: course of current medical issues. Questions answered. Outlined signs and symptoms indicating need for  more acute intervention. Patient verbalized understanding. After Visit Summary given.        Moshe Cipro, NP 06/28/19 1534

## 2019-06-28 NOTE — ED Triage Notes (Signed)
Pt concerned she has a UTI and a yeast infection,  has a strong smell with urination and her vagina is "swollen"

## 2019-06-28 NOTE — ED Notes (Signed)
Pt had first dose of birth control shot this month and is concerned because her period is a few days late, is sexually active

## 2019-06-29 ENCOUNTER — Telehealth (HOSPITAL_COMMUNITY): Payer: Self-pay | Admitting: Orthopedic Surgery

## 2019-06-29 LAB — CERVICOVAGINAL ANCILLARY ONLY
Bacterial Vaginitis (gardnerella): POSITIVE — AB
Candida Glabrata: NEGATIVE
Candida Vaginitis: NEGATIVE
Chlamydia: NEGATIVE
Comment: NEGATIVE
Comment: NEGATIVE
Comment: NEGATIVE
Comment: NEGATIVE
Comment: NEGATIVE
Comment: NORMAL
Neisseria Gonorrhea: NEGATIVE
Trichomonas: NEGATIVE

## 2019-06-29 MED ORDER — METRONIDAZOLE 500 MG PO TABS: 500.0000 mg | ORAL_TABLET | Freq: Two times a day (BID) | ORAL | 0 refills | Status: DC

## 2020-01-06 ENCOUNTER — Encounter (HOSPITAL_COMMUNITY): Payer: Self-pay | Admitting: *Deleted

## 2020-01-06 ENCOUNTER — Other Ambulatory Visit: Payer: Self-pay

## 2020-01-06 ENCOUNTER — Ambulatory Visit (HOSPITAL_COMMUNITY)
Admission: EM | Admit: 2020-01-06 | Discharge: 2020-01-06 | Disposition: A | Discharge status: Home / Self Care | Payer: Medicaid Other | Attending: Family Medicine | Admitting: Family Medicine

## 2020-01-06 DIAGNOSIS — S29012A Strain of muscle and tendon of back wall of thorax, initial encounter: Secondary | ICD-10-CM | POA: Diagnosis not present

## 2020-01-06 DIAGNOSIS — Z113 Encounter for screening for infections with a predominantly sexual mode of transmission: Secondary | ICD-10-CM | POA: Insufficient documentation

## 2020-01-06 LAB — POCT URINALYSIS DIPSTICK, ED / UC
Bilirubin Urine: NEGATIVE
Glucose, UA: NEGATIVE mg/dL
Ketones, ur: NEGATIVE mg/dL
Leukocytes,Ua: NEGATIVE
Nitrite: NEGATIVE
Protein, ur: NEGATIVE mg/dL
Specific Gravity, Urine: >=1.03 (ref 1.005–1.030)
Urobilinogen, UA: 0.2 mg/dL (ref 0.0–1.0)
pH: 5.5 (ref 5.0–8.0)

## 2020-01-06 LAB — POC URINE PREG, ED: Preg Test, Ur: NEGATIVE

## 2020-01-06 MED ORDER — KETOROLAC TROMETHAMINE 60 MG/2ML IM SOLN
60.0000 mg | Freq: Once | INTRAMUSCULAR | Status: AC
  Administered 2020-01-06: 12:00:00 60 mg via INTRAMUSCULAR

## 2020-01-06 MED ORDER — IBUPROFEN 800 MG PO TABS: 800.0000 mg | ORAL_TABLET | Freq: Three times a day (TID) | ORAL | 0 refills | Status: DC | PRN

## 2020-01-06 MED ORDER — KETOROLAC TROMETHAMINE 60 MG/2ML IM SOLN
INTRAMUSCULAR | Status: AC
  Filled 2020-01-06: qty 2

## 2020-01-06 NOTE — Discharge Instructions (Signed)
Light stretching as we discussed. Take antiinflammatory as prescribed. Start tomorrow since you were given shot in clinic today. Heating pad to affected area several times daily x 20 minutes at a time. We will let you know if you need any treatment for sexually transmitted disease.

## 2020-01-06 NOTE — ED Provider Notes (Signed)
[facility]    CSN: [id number] Arrival date & time: 01/06/20  1057      History   Chief Complaint Chief Complaint  Patient presents with  . Back Pain  . Dysuria  . STD Check    HPI Crystal Walters is a 24 y.o. female presents to urgent care today with complaints of upper back pain.  Patient states pain began after lifting heavy boxes while at work.  Patient works as Pensions consultant.  Pain worse upon movement.  Patient denies any recent cough, fever or chills.  Patient has not taken any medication for discomfort.  States she soaked in a hot tub last evening with no relief.  Patient also requesting STI screening.  She denies any unusual vaginal discharge dysuria, odor, no suspicious lesions.  States her urine is dark in color and smell strong but no pain or difficulty with urination.    Past Medical History:  Diagnosis Date  . Chlamydia   . Depression   . Seizures (HCC)    childhood  . Vaginal Pap smear, abnormal     Patient Active Problem List   Diagnosis Date Noted  . Indication for care in labor or delivery 10/21/2018    Past Surgical History:  Procedure Laterality Date  . NO PAST SURGERIES      OB History    Gravida  2   Para  2   Term  2   Preterm      AB      Living  2     SAB      IAB      Ectopic      Multiple  0   Live Births  2            Home Medications    Prior to Admission medications   Medication Sig Start Date End Date Taking? Authorizing Provider  ibuprofen (ADVIL) 800 MG tablet Take 1 tablet (800 mg total) by mouth every 8 (eight) hours as needed for moderate pain. 01/06/20   Rolla Etienne, NP  metroNIDAZOLE (FLAGYL) 500 MG tablet Take 1 tablet (500 mg total) by mouth 2 (two) times daily. 06/29/19   Merrilee Jansky, MD  omeprazole (PRILOSEC) 20 MG capsule Take 1 capsule (20 mg total) by mouth daily. 02/23/19 03/22/19  Georgetta Haber, NP    Family History Family History  Problem Relation Age of Onset   . Hypertension Mother     Social History Social History   Tobacco Use  . Smoking status: Former Games developer  . Smokeless tobacco: Never Used  Vaping Use  . Vaping Use: Never used  Substance Use Topics  . Alcohol use: Yes    Comment: occasionally  . Drug use: Yes    Types: Marijuana    Comment: occasionally     Allergies   Patient has no known allergies.   Review of Systems As stated in HPI otherwise negative   Physical Exam Triage Vital Signs ED Triage Vitals  Enc Vitals Group     BP 01/06/20 1137 120/79     Pulse Rate 01/06/20 1137 (!) 111     Resp 01/06/20 1137 18     Temp 01/06/20 1137 98.8 F (37.1 C)     Temp Source 01/06/20 1137 Oral     SpO2 01/06/20 1137 98 %     Weight --      Height --      Head Circumference --  Peak Flow --      Pain Score 01/06/20 1138 9     Pain Loc --      Pain Edu? --      Excl. in GC? --    No data found.  Updated Vital Signs BP 120/79   Pulse (!) 111   Temp 98.8 F (37.1 C) (Oral)   Resp 18   SpO2 98%   Breastfeeding No   Physical Exam Constitutional:      General: She is not in acute distress.    Appearance: Normal appearance. She is not ill-appearing.  Cardiovascular:     Rate and Rhythm: Normal rate and regular rhythm.  Pulmonary:     Effort: Pulmonary effort is normal.     Breath sounds: Normal breath sounds.  Musculoskeletal:        General: No swelling or deformity. Normal range of motion.     Cervical back: Normal range of motion and neck supple. No rigidity or tenderness.     Comments: Tenderness upon palpation of left upper back paraspinal muscles.  Nontender to palpation along spine  Skin:    General: Skin is warm and dry.  Neurological:     Mental Status: She is alert.  Psychiatric:        Mood and Affect: Mood normal.        Behavior: Behavior normal.      UC Treatments / Results  Labs (all labs ordered are listed, but only abnormal results are displayed) Labs Reviewed  POCT  URINALYSIS DIPSTICK, ED / UC - Abnormal; Notable for the following components:      Result Value   Hgb urine dipstick TRACE (*)    All other components within normal limits  POC URINE PREG, ED  CERVICOVAGINAL ANCILLARY ONLY    EKG   Radiology No results found.  Procedures Procedures (including critical care time)  Medications Ordered in UC Medications  ketorolac (TORADOL) injection 60 mg (60 mg Intramuscular Given 01/06/20 1225)    Initial Impression / Assessment and Plan / UC Course  I have reviewed the triage vital signs and the nursing notes.  Pertinent labs & imaging results that were available during my care of the patient were reviewed by me and considered in my medical decision making (see chart for details).  Upper back strain -From heavy lifting at work -Toradol IM in clinic, start Motrin 800 mg every 8 hours as needed tomorrow -Heat, stretching  STI screening -Strong odor to urine and dark color likely related to decreased fluid intake as urine SG> -Follow-up screening to determine need for any STI treatment.  She was not treated in office today as she is asymptomatic  Final Clinical Impressions(s) / UC Diagnoses   Final diagnoses:  Upper back strain, initial encounter  Routine screening for STI (sexually transmitted infection)     Discharge Instructions     Light stretching as we discussed. Take antiinflammatory as prescribed. Start tomorrow since you were given shot in clinic today. Heating pad to affected area several times daily x 20 minutes at a time. We will let you know if you need any treatment for sexually transmitted disease.     ED Prescriptions    Medication Sig Dispense Auth. Provider   ibuprofen (ADVIL) 800 MG tablet Take 1 tablet (800 mg total) by mouth every 8 (eight) hours as needed for moderate pain. 30 tablet Rolla Etienne, NP     PDMP not reviewed this encounter.   Blondell Reveal  E, NP 01/06/20 1858

## 2020-01-06 NOTE — ED Triage Notes (Addendum)
Pt works lifting/delivering boxes for work; started with gradual onset left mid back pain yesterday.  C/O tenderness to area, pain with various movements. Also c/o dysuria x approx 1 month; states urine has foul odor.  Denies fevers. Also wishes to have preg and STD testing.

## 2020-01-07 LAB — CERVICOVAGINAL ANCILLARY ONLY
Bacterial Vaginitis (gardnerella): POSITIVE — AB
Chlamydia: NEGATIVE
Comment: NEGATIVE
Comment: NEGATIVE
Comment: NEGATIVE
Comment: NORMAL
Neisseria Gonorrhea: NEGATIVE
Trichomonas: NEGATIVE

## 2020-01-08 ENCOUNTER — Telehealth (HOSPITAL_COMMUNITY): Payer: Self-pay | Admitting: Emergency Medicine

## 2020-01-08 MED ORDER — METRONIDAZOLE 500 MG PO TABS: 500.0000 mg | ORAL_TABLET | Freq: Two times a day (BID) | ORAL | 0 refills | Status: DC

## 2020-05-15 ENCOUNTER — Emergency Department (HOSPITAL_COMMUNITY)
Admission: EM | Admit: 2020-05-15 | Discharge: 2020-05-15 | Disposition: A | Discharge status: Home / Self Care | Payer: Medicaid Other | Attending: Emergency Medicine | Admitting: Emergency Medicine

## 2020-05-15 ENCOUNTER — Other Ambulatory Visit: Payer: Self-pay

## 2020-05-15 ENCOUNTER — Encounter (HOSPITAL_COMMUNITY): Payer: Self-pay

## 2020-05-15 DIAGNOSIS — Z5321 Procedure and treatment not carried out due to patient leaving prior to being seen by health care provider: Secondary | ICD-10-CM | POA: Insufficient documentation

## 2020-05-15 DIAGNOSIS — R109 Unspecified abdominal pain: Secondary | ICD-10-CM | POA: Diagnosis not present

## 2020-05-15 DIAGNOSIS — T7840XA Allergy, unspecified, initial encounter: Secondary | ICD-10-CM | POA: Insufficient documentation

## 2020-05-15 DIAGNOSIS — R1110 Vomiting, unspecified: Secondary | ICD-10-CM | POA: Diagnosis not present

## 2020-05-15 LAB — COMPREHENSIVE METABOLIC PANEL
ALT: 17 U/L (ref 0–44)
AST: 29 U/L (ref 15–41)
Albumin: 4.5 g/dL (ref 3.5–5.0)
Alkaline Phosphatase: 74 U/L (ref 38–126)
Anion gap: 11 (ref 5–15)
BUN: 15 mg/dL (ref 6–20)
CO2: 27 mmol/L (ref 22–32)
Calcium: 9.1 mg/dL (ref 8.9–10.3)
Chloride: 93 mmol/L — ABNORMAL LOW (ref 98–111)
Creatinine, Ser: 0.92 mg/dL (ref 0.44–1.00)
GFR, Estimated: >60 mL/min (ref >60)
Glucose, Bld: 96 mg/dL (ref 70–99)
Potassium: 3.3 mmol/L — ABNORMAL LOW (ref 3.5–5.1)
Sodium: 131 mmol/L — ABNORMAL LOW (ref 135–145)
Total Bilirubin: 1 mg/dL (ref 0.3–1.2)
Total Protein: 8.5 g/dL — ABNORMAL HIGH (ref 6.5–8.1)

## 2020-05-15 LAB — CBC WITH DIFFERENTIAL/PLATELET
Abs Immature Granulocytes: 0.01 10*3/uL (ref 0.00–0.07)
Basophils Absolute: 0 10*3/uL (ref 0.0–0.1)
Basophils Relative: 1 %
Eosinophils Absolute: 0 10*3/uL (ref 0.0–0.5)
Eosinophils Relative: 0 %
HCT: 46.8 % — ABNORMAL HIGH (ref 36.0–46.0)
Hemoglobin: 15.5 g/dL — ABNORMAL HIGH (ref 12.0–15.0)
Immature Granulocytes: 0 %
Lymphocytes Relative: 26 %
Lymphs Abs: 1.5 10*3/uL (ref 0.7–4.0)
MCH: 30.4 pg (ref 26.0–34.0)
MCHC: 33.1 g/dL (ref 30.0–36.0)
MCV: 91.8 fL (ref 80.0–100.0)
Monocytes Absolute: 0.7 10*3/uL (ref 0.1–1.0)
Monocytes Relative: 12 %
Neutro Abs: 3.7 10*3/uL (ref 1.7–7.7)
Neutrophils Relative %: 61 %
Platelets: 393 10*3/uL (ref 150–400)
RBC: 5.1 MIL/uL (ref 3.87–5.11)
RDW: 12.6 % (ref 11.5–15.5)
WBC: 6 10*3/uL (ref 4.0–10.5)
nRBC: 0 % (ref 0.0–0.2)

## 2020-05-15 LAB — URINALYSIS, ROUTINE W REFLEX MICROSCOPIC
Bilirubin Urine: NEGATIVE
Glucose, UA: NEGATIVE mg/dL
Ketones, ur: NEGATIVE mg/dL
Leukocytes,Ua: NEGATIVE
Nitrite: NEGATIVE
Protein, ur: NEGATIVE mg/dL
Specific Gravity, Urine: 1.009 (ref 1.005–1.030)
pH: 6 (ref 5.0–8.0)

## 2020-05-15 LAB — LIPASE, BLOOD: Lipase: 26 U/L (ref 11–51)

## 2020-05-15 MED ORDER — ONDANSETRON 4 MG PO TBDP
4.0000 mg | ORAL_TABLET | Freq: Once | ORAL | Status: AC
  Administered 2020-05-15: 4 mg via ORAL
  Filled 2020-05-15: qty 1

## 2020-05-15 MED ORDER — CAPSAICIN 0.025 % EX CREA
TOPICAL_CREAM | Freq: Once | CUTANEOUS | Status: DC
  Filled 2020-05-15: qty 60

## 2020-05-15 MED ORDER — SODIUM CHLORIDE 0.9 % IV BOLUS
1000.0000 mL | Freq: Once | INTRAVENOUS | Status: DC
Start: 2020-05-15 — End: 2020-05-16

## 2020-05-15 NOTE — ED Triage Notes (Signed)
Emergency Medicine Provider Triage Evaluation Note  Debbe Crumble , a 25 y.o. female  was evaluated in triage.  Pt complains of vomiting and abdominal pain since 4 day, reports same from marijuana use/allergy. Better with hot showers. Feels like she is dehydrated, unable to keep anything down.   Review of Systems  Positive: Abdominal pain, vomiting Negative: fever  Physical Exam  BP (!) 144/102 (BP Location: Left Arm)   Pulse 78   Temp 98.1 F (36.7 C)   Resp 16   SpO2 100%  Gen:   Awake, no distress   HEENT:  Atraumatic  Resp:  Normal effort  Cardiac:  Normal rate  Abd:   Nondistended, mild diffuse tenderness MSK:   Moves extremities without difficulty  Neuro:  Speech clear   Medical Decision Making  Medically screening exam initiated at 3:51 PM.  Appropriate orders placed.  Hazelee Harbold was informed that the remainder of the evaluation will be completed by another provider, this initial triage assessment does not replace that evaluation, and the importance of remaining in the ED until their evaluation is complete.  Clinical Impression     Jeannie Fend, PA-C 05/15/20 1558

## 2020-05-15 NOTE — ED Notes (Signed)
Pt called for update, not seen in lobby, no response.

## 2020-05-15 NOTE — ED Triage Notes (Signed)
Pt talking on phone prior to triage, no distress noted. Pt states she is allergic to marijuana and she smoked marijuana. Pt states she gets dehydrated due to the abdominal pain, nausea and vomiting. Pt states she feels dehydrated, states nausea has improved. Pt states she vomited yesterday and the day before.

## 2020-06-18 ENCOUNTER — Other Ambulatory Visit: Payer: Self-pay

## 2020-06-18 ENCOUNTER — Ambulatory Visit (HOSPITAL_COMMUNITY)
Admission: EM | Admit: 2020-06-18 | Discharge: 2020-06-18 | Disposition: A | Discharge status: Home / Self Care | Payer: Medicaid Other | Attending: Family Medicine | Admitting: Family Medicine

## 2020-06-18 ENCOUNTER — Encounter (HOSPITAL_COMMUNITY): Payer: Self-pay

## 2020-06-18 DIAGNOSIS — Z711 Person with feared health complaint in whom no diagnosis is made: Secondary | ICD-10-CM | POA: Diagnosis present

## 2020-06-18 DIAGNOSIS — N898 Other specified noninflammatory disorders of vagina: Secondary | ICD-10-CM

## 2020-06-18 LAB — HIV ANTIBODY (ROUTINE TESTING W REFLEX): HIV Screen 4th Generation wRfx: NONREACTIVE

## 2020-06-18 NOTE — ED Triage Notes (Signed)
Pt in with c/o vaginal discharge x 2 days  Pt requesting std and hiv test

## 2020-06-19 ENCOUNTER — Telehealth (HOSPITAL_COMMUNITY): Payer: Self-pay | Admitting: Emergency Medicine

## 2020-06-19 LAB — CERVICOVAGINAL ANCILLARY ONLY
Bacterial Vaginitis (gardnerella): POSITIVE — AB
Candida Glabrata: NEGATIVE
Candida Vaginitis: NEGATIVE
Chlamydia: NEGATIVE
Comment: NEGATIVE
Comment: NEGATIVE
Comment: NEGATIVE
Comment: NEGATIVE
Comment: NEGATIVE
Comment: NORMAL
Neisseria Gonorrhea: NEGATIVE
Trichomonas: NEGATIVE

## 2020-06-19 LAB — RPR: RPR Ser Ql: NONREACTIVE

## 2020-06-19 MED ORDER — METRONIDAZOLE 500 MG PO TABS: 500.0000 mg | ORAL_TABLET | Freq: Two times a day (BID) | ORAL | 0 refills | Status: DC

## 2020-06-22 NOTE — ED Provider Notes (Signed)
[facility]    CSN: [id number] Arrival date & time: 06/18/20  1929      History   Chief Complaint Chief Complaint  Patient presents with  . Vaginal Discharge    HPI Crystal Walters is a 25 y.o. female.   Presenting today with 2 day hx of vaginal discharge. Denies itching, irritation, rashes, lesions, pelvic or abdominal pain, N/V/D, fever. Does have a new sexual partner and requesting full STI testing. Not trying anything OTC for sxs.     Past Medical History:  Diagnosis Date  . Chlamydia   . Depression   . Seizures (HCC)    childhood  . Vaginal Pap smear, abnormal     Patient Active Problem List   Diagnosis Date Noted  . Indication for care in labor or delivery 10/21/2018    Past Surgical History:  Procedure Laterality Date  . NO PAST SURGERIES      OB History    Gravida  2   Para  2   Term  2   Preterm      AB      Living  2     SAB      IAB      Ectopic      Multiple  0   Live Births  2            Home Medications    Prior to Admission medications   Medication Sig Start Date End Date Taking? Authorizing Provider  ibuprofen (ADVIL) 800 MG tablet Take 1 tablet (800 mg total) by mouth every 8 (eight) hours as needed for moderate pain. 01/06/20   Rolla Etienne, NP  metroNIDAZOLE (FLAGYL) 500 MG tablet Take 1 tablet (500 mg total) by mouth 2 (two) times daily. 06/19/20   Merrilee Jansky, MD  omeprazole (PRILOSEC) 20 MG capsule Take 1 capsule (20 mg total) by mouth daily. 02/23/19 03/22/19  Georgetta Haber, NP    Family History Family History  Problem Relation Age of Onset  . Hypertension Mother     Social History Social History   Tobacco Use  . Smoking status: Former Games developer  . Smokeless tobacco: Never Used  Vaping Use  . Vaping Use: Never used  Substance Use Topics  . Alcohol use: Yes    Comment: occasionally  . Drug use: Yes    Types: Marijuana    Comment: occasionally     Allergies   Patient has no  known allergies.   Review of Systems Review of Systems PER HPI    Physical Exam Triage Vital Signs ED Triage Vitals  Enc Vitals Group     BP 06/18/20 1944 (!) 149/98     Pulse Rate 06/18/20 1944 98     Resp 06/18/20 1944 18     Temp 06/18/20 1947 98.7 F (37.1 C)     Temp Source 06/18/20 1947 Oral     SpO2 06/18/20 1944 100 %     Weight --      Height --      Head Circumference --      Peak Flow --      Pain Score 06/18/20 1945 0     Pain Loc --      Pain Edu? --      Excl. in GC? --    No data found.  Updated Vital Signs BP (!) 149/98   Pulse 98   Temp 98.7 F (37.1 C) (Oral)   Resp 18  LMP 05/29/2020   SpO2 100%   Breastfeeding No   Visual Acuity Right Eye Distance:   Left Eye Distance:   Bilateral Distance:    Right Eye Near:   Left Eye Near:    Bilateral Near:     Physical Exam Vitals and nursing note reviewed. Exam conducted with a chaperone present.  Constitutional:      Appearance: Normal appearance. She is not ill-appearing.  HENT:     Head: Atraumatic.  Eyes:     Extraocular Movements: Extraocular movements intact.     Conjunctiva/sclera: Conjunctivae normal.  Cardiovascular:     Rate and Rhythm: Normal rate and regular rhythm.     Heart sounds: Normal heart sounds.  Pulmonary:     Effort: Pulmonary effort is normal.     Breath sounds: Normal breath sounds.  Abdominal:     General: Bowel sounds are normal. There is no distension.     Palpations: Abdomen is soft.     Tenderness: There is no abdominal tenderness. There is no guarding.  Genitourinary:    Comments: Copious white discharge, no rashes, lesions, bleeding, or other abnormality noted Musculoskeletal:        General: Normal range of motion.     Cervical back: Normal range of motion and neck supple.  Skin:    General: Skin is warm and dry.  Neurological:     Mental Status: She is alert and oriented to person, place, and time.  Psychiatric:        Mood and Affect: Mood  normal.        Thought Content: Thought content normal.        Judgment: Judgment normal.      UC Treatments / Results  Labs (all labs ordered are listed, but only abnormal results are displayed) Labs Reviewed  CERVICOVAGINAL ANCILLARY ONLY - Abnormal; Notable for the following components:      Result Value   Bacterial Vaginitis (gardnerella) Positive (*)    All other components within normal limits  RPR  HIV ANTIBODY (ROUTINE TESTING W REFLEX)    EKG   Radiology No results found.  Procedures Procedures (including critical care time)  Medications Ordered in UC Medications - No data to display  Initial Impression / Assessment and Plan / UC Course  I have reviewed the triage vital signs and the nursing notes.  Pertinent labs & imaging results that were available during my care of the patient were reviewed by me and considered in my medical decision making (see chart for details).     Exam and vital signs reassuring, vaginal swab and HIV, syphilis labs pending. Will treat based on results. Safe sexual practices reviewed Final Clinical Impressions(s) / UC Diagnoses   Final diagnoses:  Vaginal discharge  Concern about STD in female without diagnosis   Discharge Instructions   None    ED Prescriptions    None     PDMP not reviewed this encounter.   Particia Nearing, [place] 06/22/20 1130

## 2020-08-25 IMAGING — US US MFM FETAL NUCHAL TRANSLUCENCY
1 series · 15 of 28 positions shown · non-contrast
Comparison: none

[Series 1: us mfm fetal nuchal translucency · 15 of 28 slices shown]
[im 1/28]
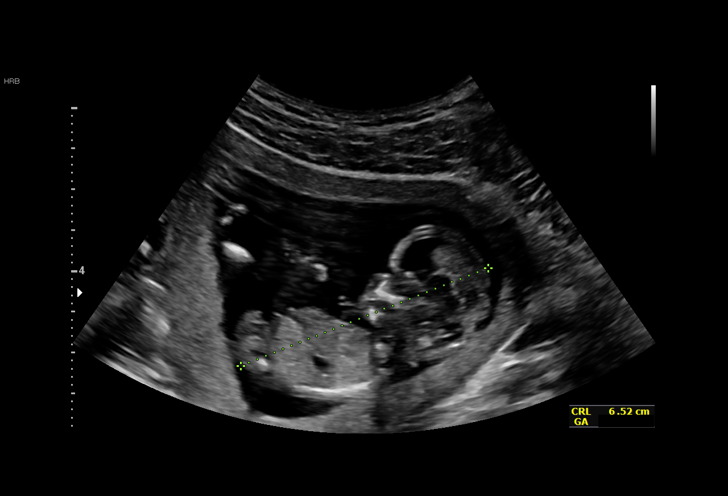
[im 3/28]
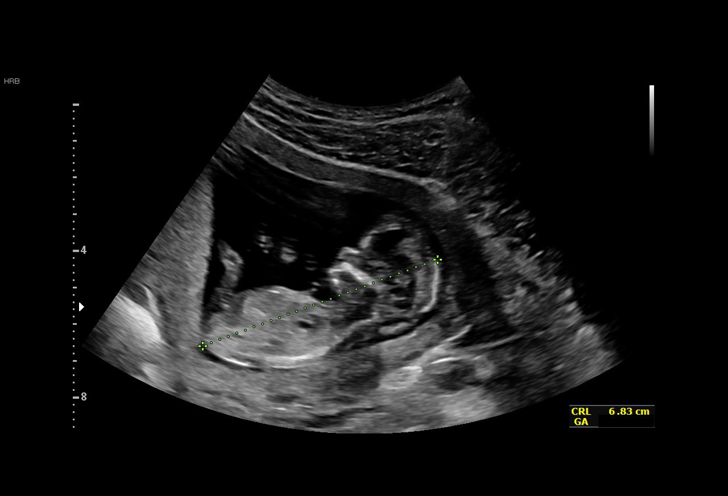
[im 5/28]
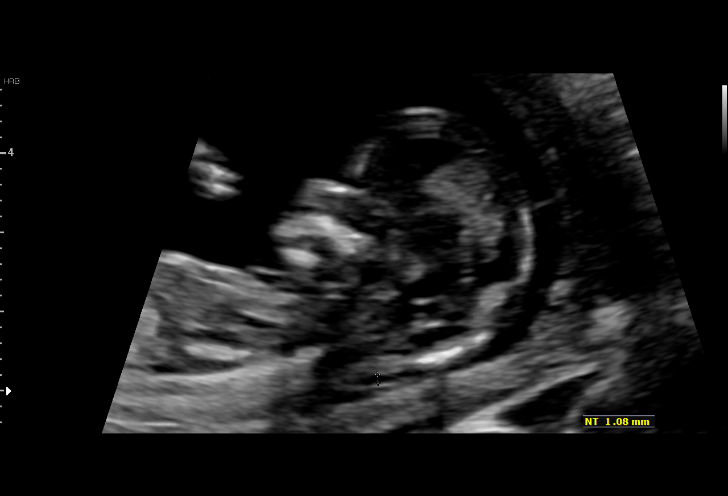
[im 7/28]
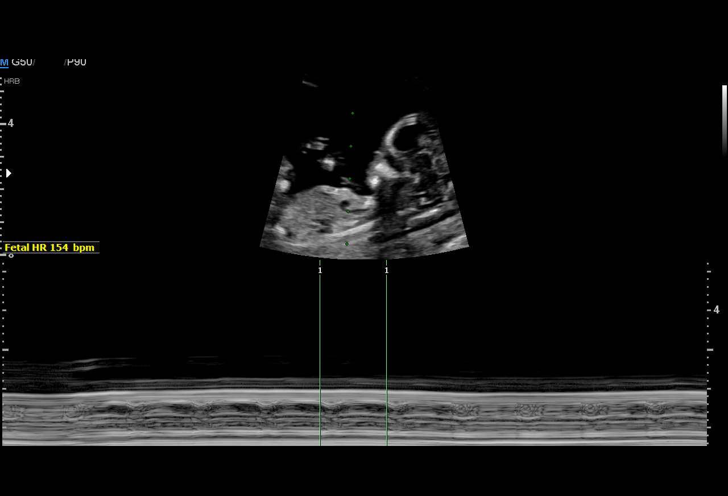
[im 9/28]
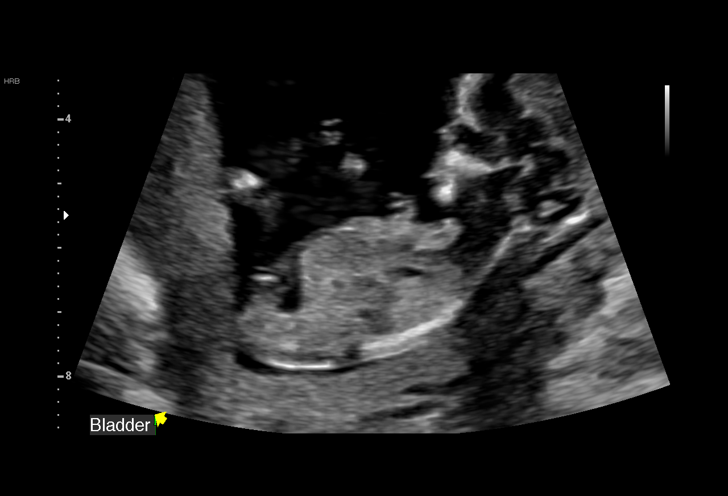
[im 11/28]
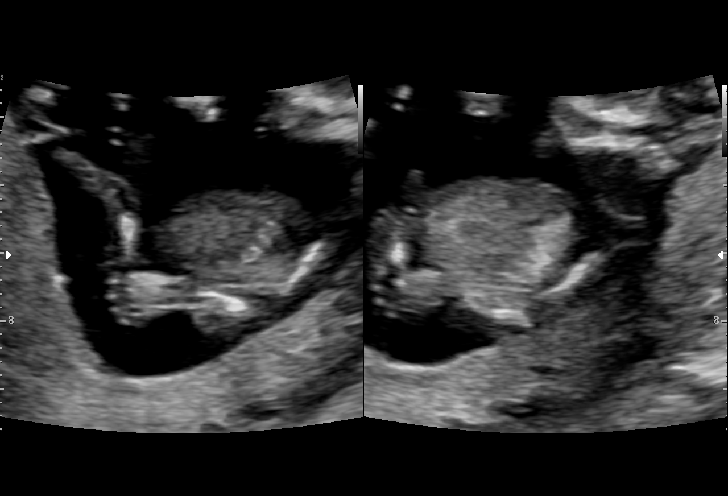
[im 13/28]
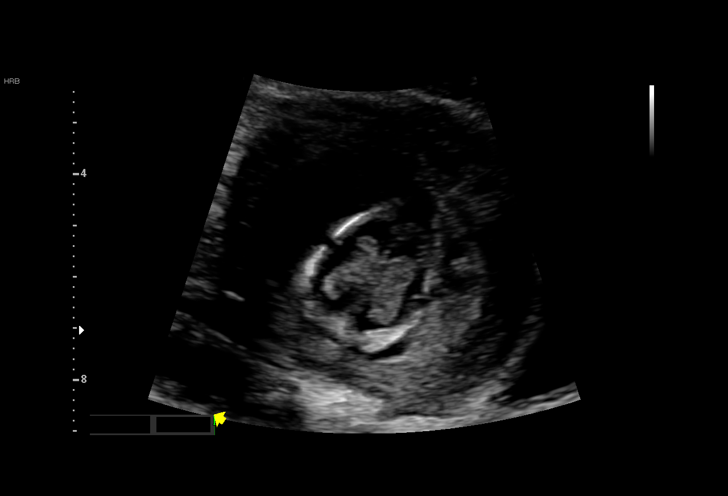
[im 15/28]
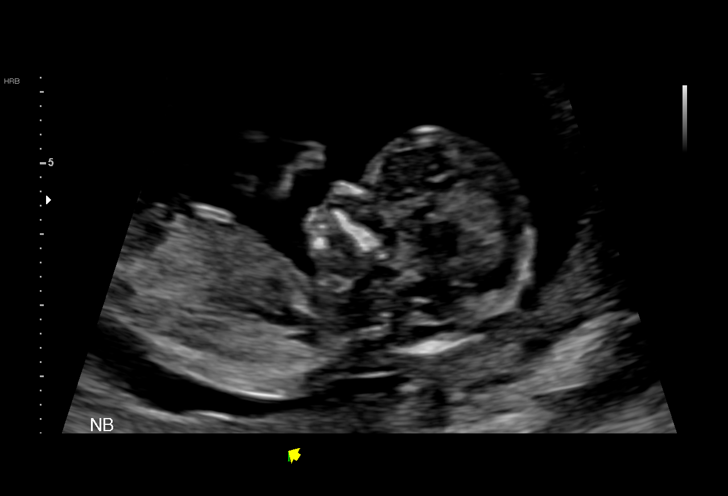
[im 16/28]
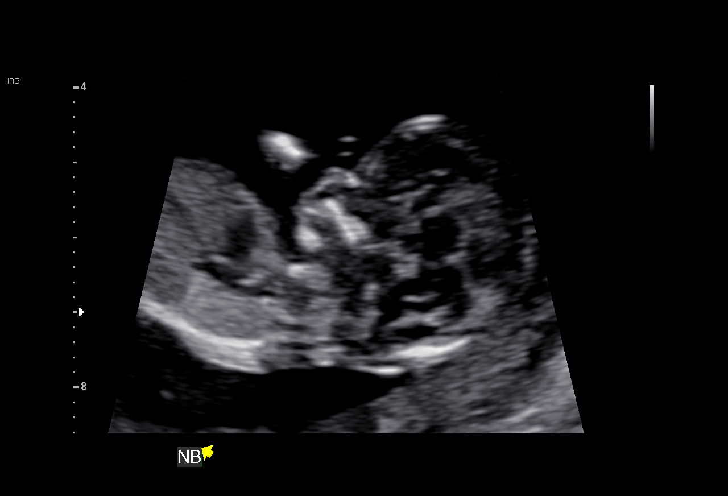
[im 18/28]
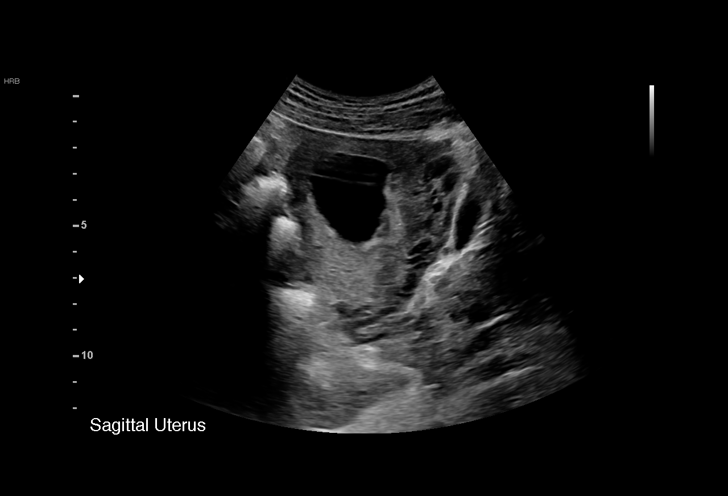
[im 20/28]
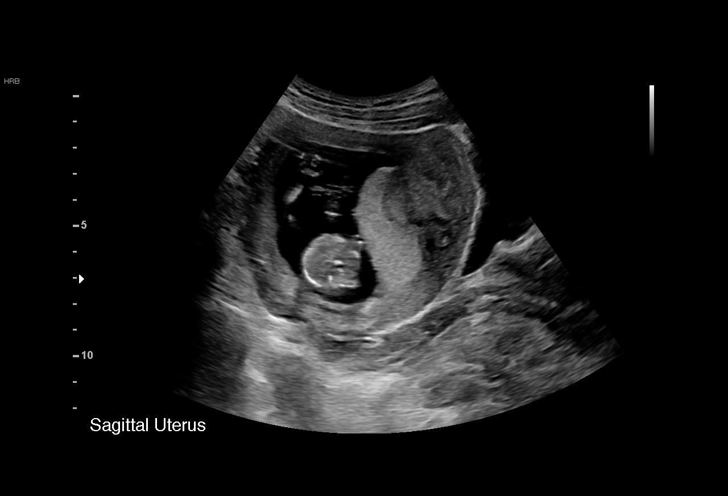
[im 22/28]
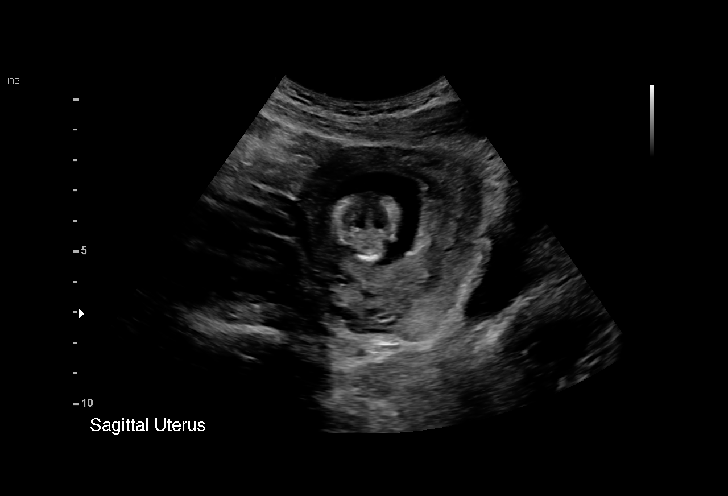
[im 24/28]
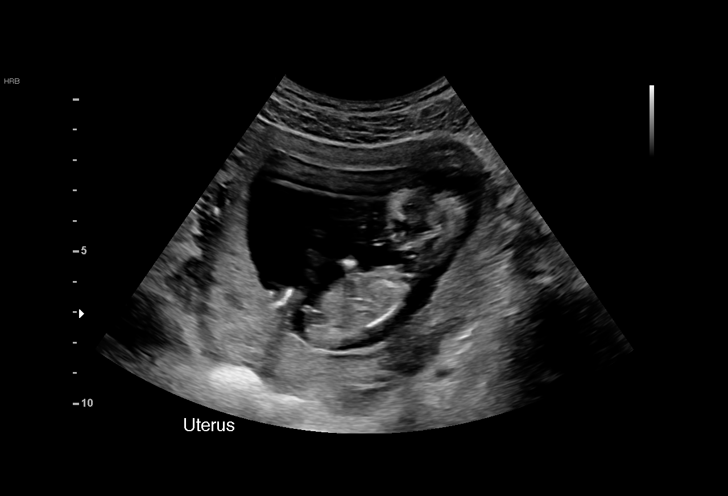
[im 26/28]
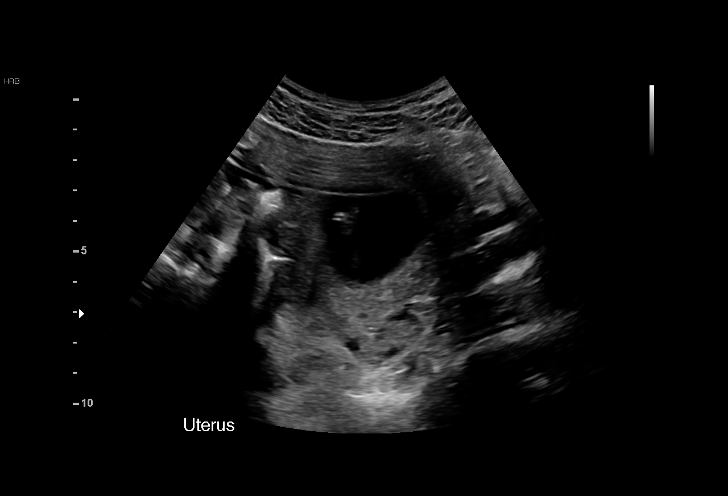
[im 28/28]
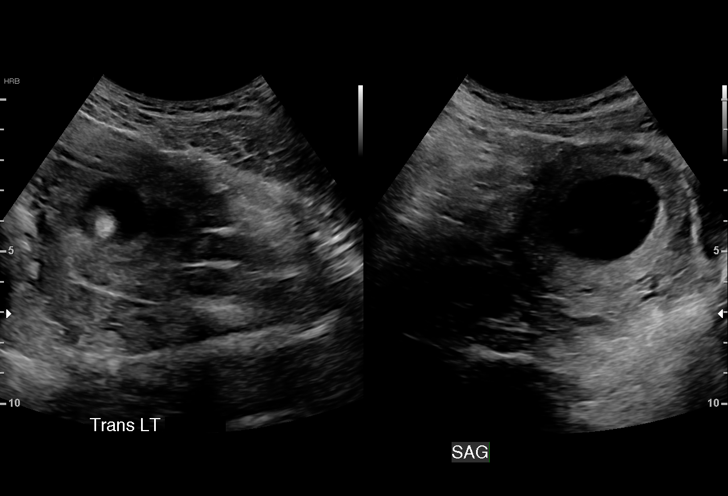

[15 of 28 positions shown; findings below may reference images not displayed]

Health Department
                   Ty Beanie Boo NP

     TRANSLUCENCY                                      NOSA
 ----------------------------------------------------------------------

 ----------------------------------------------------------------------
Indications

  Encounter for nuchal translucency
  13 weeks gestation of pregnancy
 ----------------------------------------------------------------------
Vital Signs

 BMI:
Fetal Evaluation

 Num Of Fetuses:         1
 Fetal Heart Rate(bpm):  154
 Cardiac Activity:       Observed

 Amniotic Fluid
 AFI FV:      Within normal limits
Biometry

 CRL:      67.2  mm     G. Age:  12w 6d                  EDD:   10/19/18

 NT:        1.1  mm
OB History

 Gravidity:    2         Term:   1
 Living:       1
Gestational Age
 LMP:           13w 4d        Date:  01/07/18                 EDD:   10/14/18
 Best:          13w 4d     Det. By:  LMP  (01/07/18)          EDD:   10/14/18
1st Trimester Genetic Sonogram Screening

 CRL:            67.2  mm    G. Age:   12w 6d                 EDD:   10/19/18
 Nuc Trans:       1.1  mm

 Nasal Bone:                 Present
Anatomy

 Cranium:               Appears normal         Bladder:                Appears normal
 Choroid Plexus:        Appears normal         Upper Extremities:      Appears normal
 Stomach:               Appears normal, left   Lower Extremities:      Appears normal
                        sided
Impression

 On ultrasound, the CRL measurement is consistent with her
 previously-established dates and good fetal heart activity is
 seen. The nuchal translucency (NT) measures
 millimeters, which is normal.  Fetal anatomy that could be
 ascertained at this gestational age is normal.

 Blood was drawn for ENGLISH-PROGRAM and beta hCG. We will
 communicate first-trimester screening results to the patient.
Recommendations

 Fetal anatomy scan at 18 to 20 weeks.
                 Aparicio, Nawaf

## 2020-11-07 ENCOUNTER — Encounter (HOSPITAL_COMMUNITY): Payer: Self-pay

## 2020-11-07 ENCOUNTER — Ambulatory Visit (HOSPITAL_COMMUNITY)
Admission: EM | Admit: 2020-11-07 | Discharge: 2020-11-07 | Disposition: A | Discharge status: Home / Self Care | Payer: Medicaid Other | Attending: Urgent Care | Admitting: Urgent Care

## 2020-11-07 DIAGNOSIS — B3731 Acute candidiasis of vulva and vagina: Secondary | ICD-10-CM | POA: Diagnosis present

## 2020-11-07 DIAGNOSIS — N898 Other specified noninflammatory disorders of vagina: Secondary | ICD-10-CM | POA: Insufficient documentation

## 2020-11-07 MED ORDER — FLUCONAZOLE 150 MG PO TABS: 150.0000 mg | ORAL_TABLET | ORAL | 0 refills | Status: DC

## 2020-11-07 NOTE — ED Triage Notes (Signed)
Pt reports vaginal itching x 2 days. Pt requested STD's test.  

## 2020-11-10 LAB — CERVICOVAGINAL ANCILLARY ONLY
Bacterial Vaginitis (gardnerella): NEGATIVE
Candida Glabrata: NEGATIVE
Candida Vaginitis: POSITIVE — AB
Chlamydia: NEGATIVE
Comment: NEGATIVE
Comment: NEGATIVE
Comment: NEGATIVE
Comment: NEGATIVE
Comment: NEGATIVE
Comment: NORMAL
Neisseria Gonorrhea: NEGATIVE
Trichomonas: NEGATIVE

## 2021-01-04 IMAGING — US US MFM OB FOLLOW UP
1 series · 14 of 28 positions shown · non-contrast
Comparison: none

[Series 1: us mfm ob follow up · 34 acquisitions, 14 frames shown]
[im 2/34]
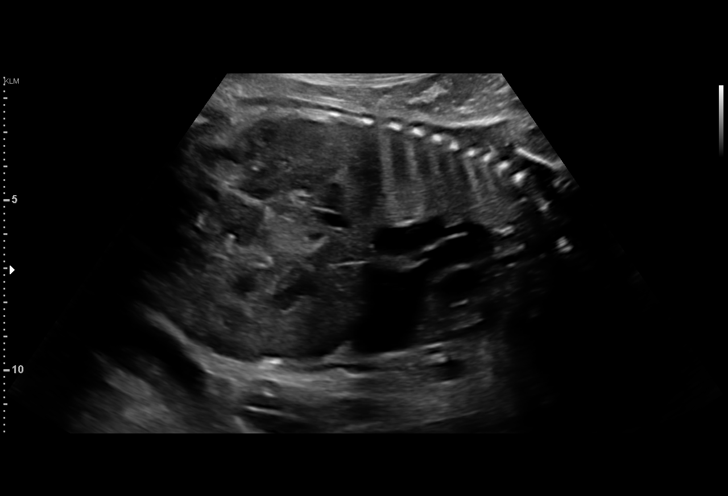
[im 4/34]
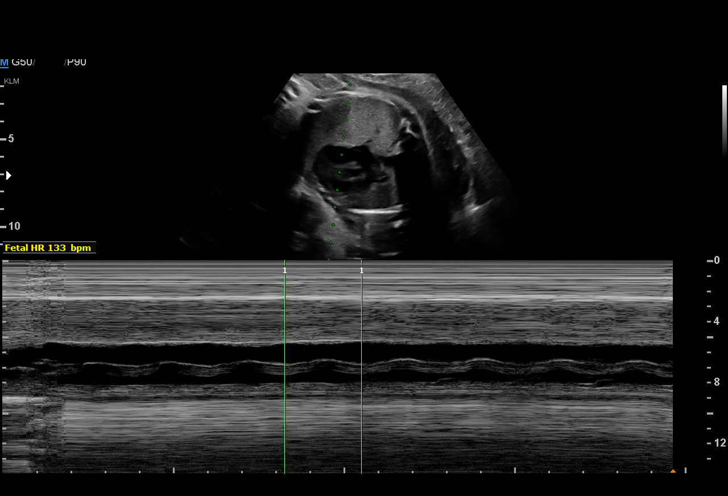
[im 7/34]
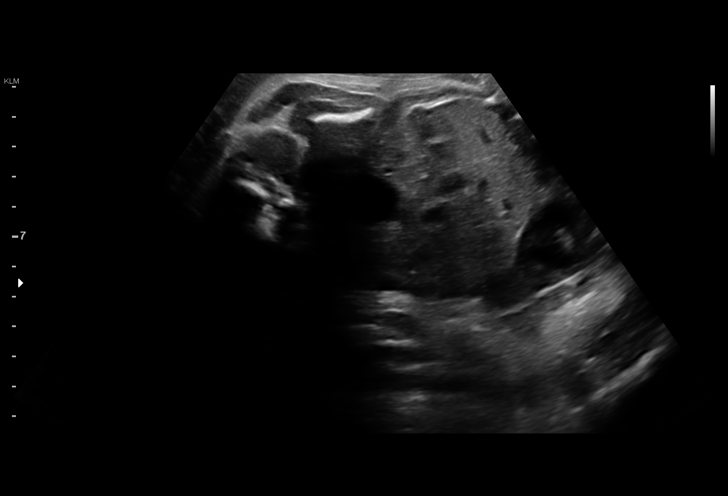
[im 9/34]
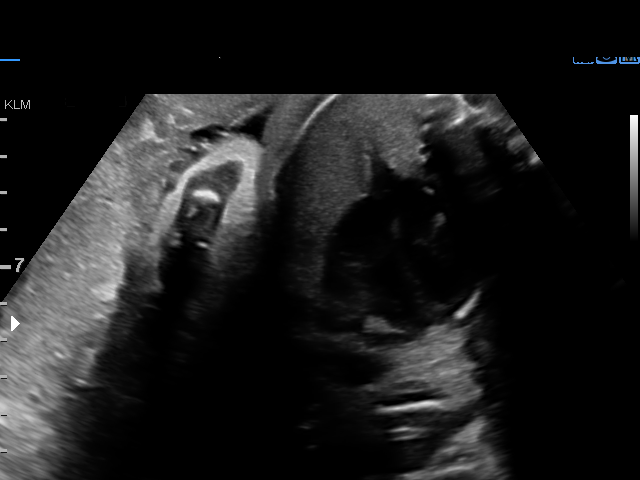
[im 12/34]
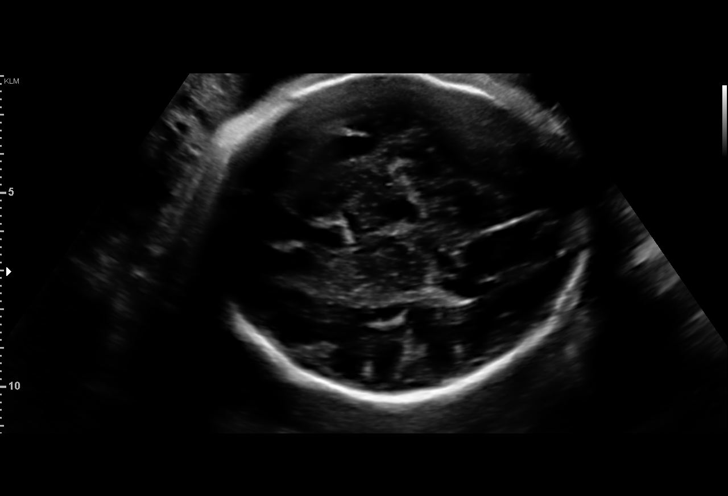
[im 14/34]
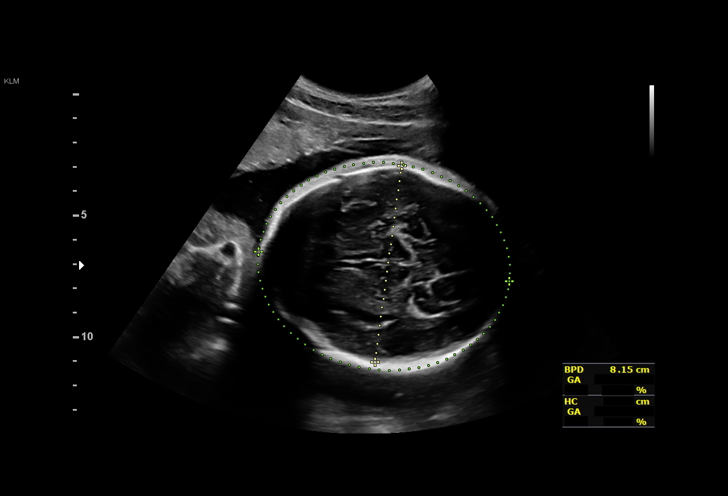
[im 16/34]
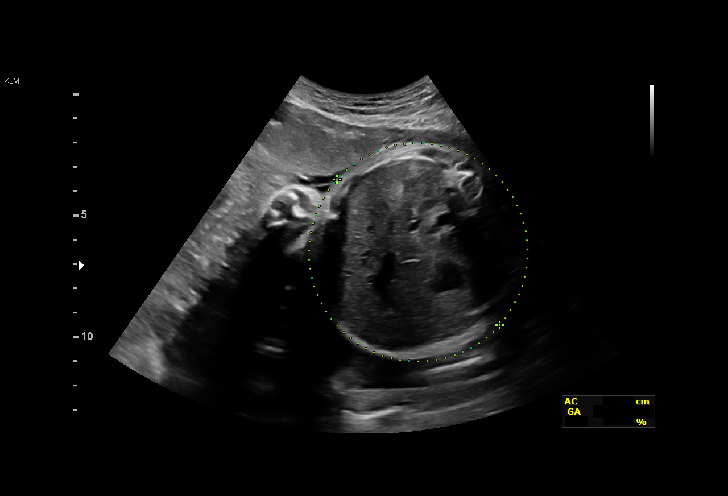
[im 19/34]
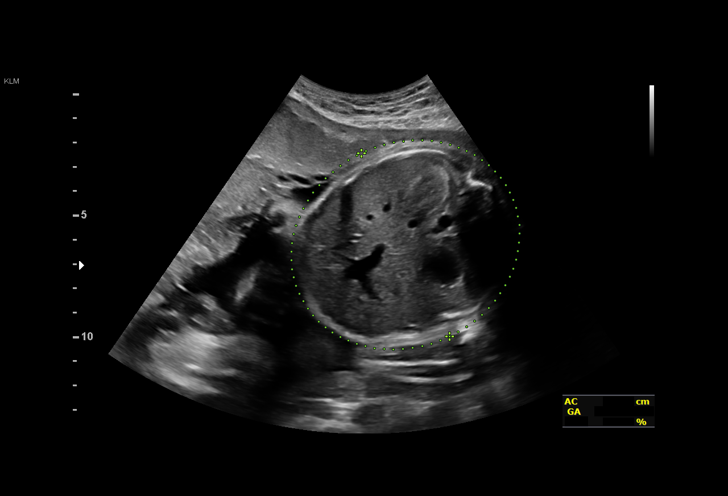
[im 21/34]
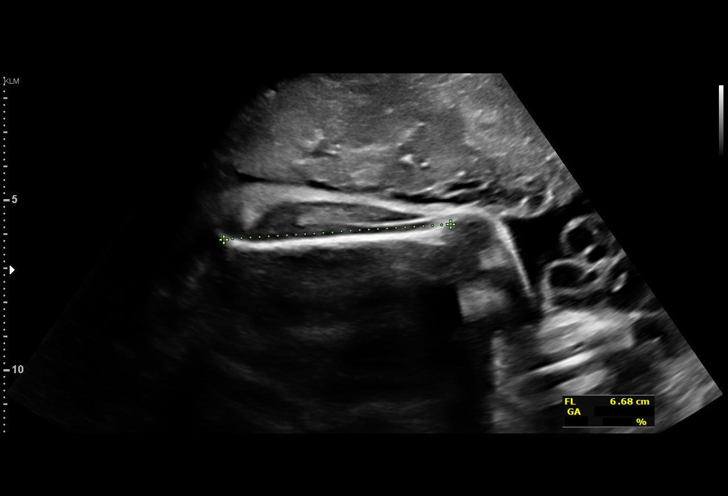
[im 24/34]
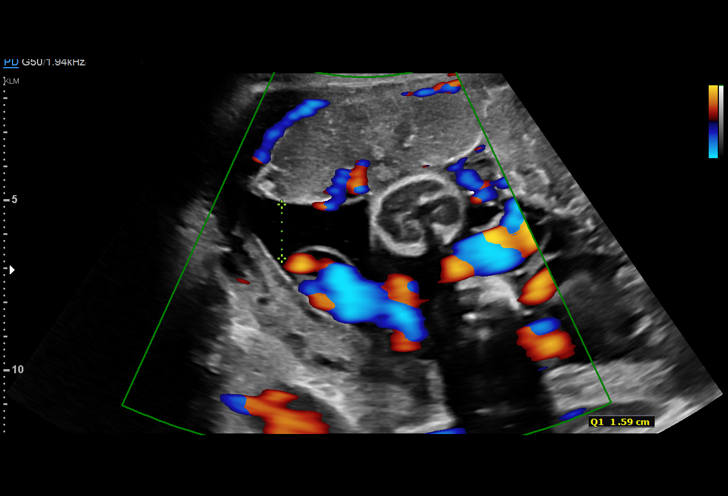
[im 26/34]
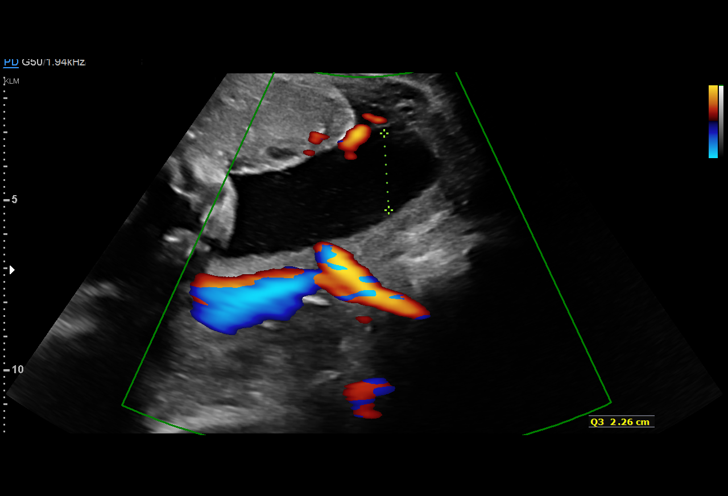
[im 29/34]
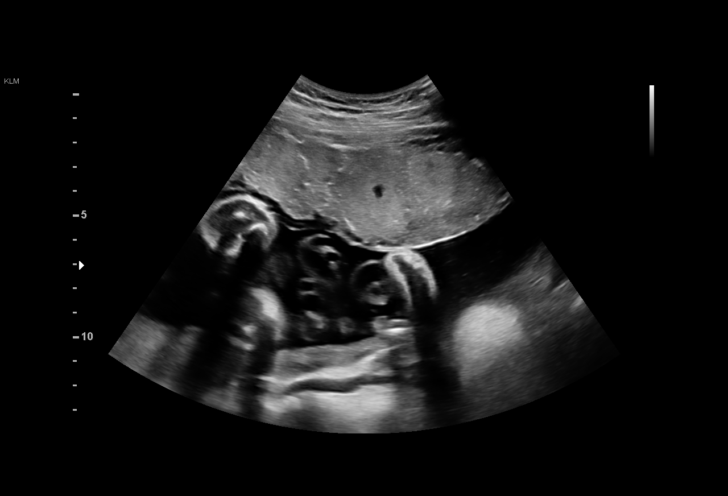
[im 31/34]
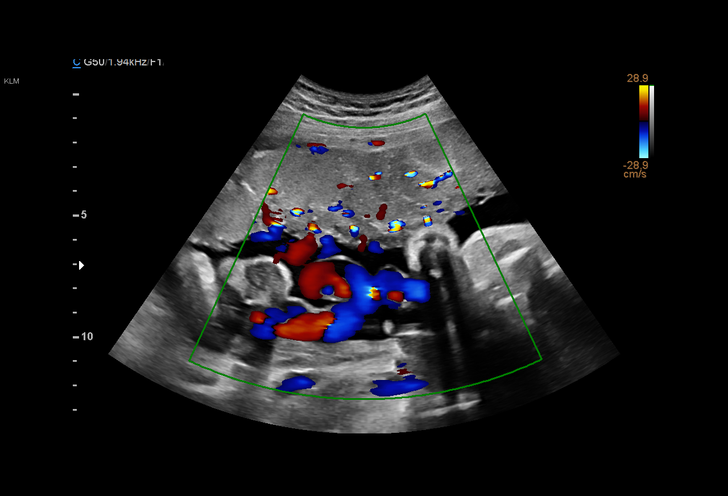
[im 34/34]
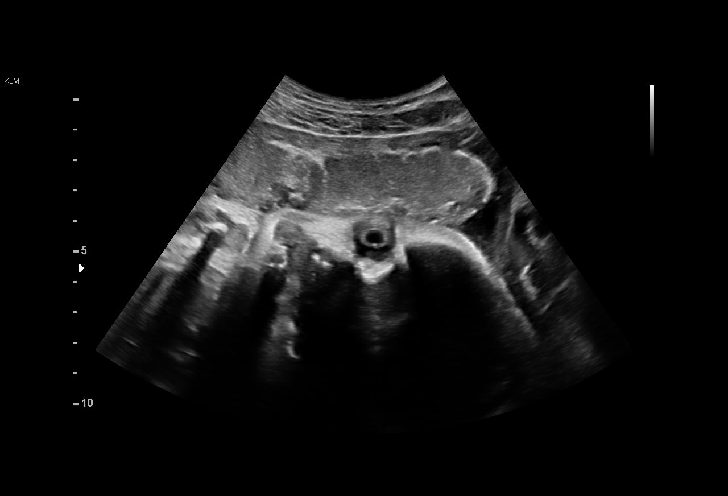

[14 of 28 positions shown; findings below may reference images not displayed]

Health Department
                   Ewandro NP

                                                       PRINCE ALDRIN
 ----------------------------------------------------------------------

 ----------------------------------------------------------------------
Indications

  Abnormal biochemical screen (Low
  AR AR)(2nd Tri  AFP 2.08)
  32 weeks gestation of pregnancy
  Seizure disorder
 ----------------------------------------------------------------------
Vital Signs

                                                Height:        5'7"
Fetal Evaluation

 Num Of Fetuses:          1
 Fetal Heart Rate(bpm):   133
 Cardiac Activity:        Observed
 Presentation:            Cephalic
 Placenta:                Anterior
 P. Cord Insertion:       Visualized

 Amniotic Fluid
 AFI FV:      Within normal limits

 AFI Sum(cm)     %Tile       Largest Pocket(cm)
 9.13            10

 RUQ(cm)       RLQ(cm)       LUQ(cm)        LLQ(cm)

Biometry
 BPD:      81.5  mm     G. Age:  32w 5d         52  %    CI:        74.37   %    70 - 86
                                                         FL/HC:       22.0  %    19.1 -
 HC:       300   mm     G. Age:  33w 2d         34  %    HC/AC:       1.07       0.96 -
 AC:      280.6  mm     G. Age:  32w 1d         40  %    FL/BPD:      81.0  %    71 - 87
 FL:         66  mm     G. Age:  34w 0d         79  %    FL/AC:       23.5  %    20 - 24

 Est. FW:    7047   gm     4 lb 9 oz     53  %
OB History

 Gravidity:    2         Term:   1
 Living:       1
Gestational Age

 LMP:           32w 3d        Date:  01/07/18                 EDD:   10/14/18
 U/S Today:     33w 0d                                        EDD:   10/10/18
 Best:          32w 3d     Det. By:  LMP  (01/07/18)          EDD:   10/14/18
Anatomy

 Cranium:               Appears normal         Aortic Arch:            Previously seen
 Cavum:                 Previously seen        Ductal Arch:            Previously seen
 Ventricles:            Appears normal         Diaphragm:              Appears normal
 Choroid Plexus:        Previously seen        Stomach:                Appears normal, left
                                                                       sided
 Cerebellum:            Previously seen        Abdomen:                Previously seen
 Posterior Fossa:       Previously seen        Abdominal Wall:         Previously seen
 Nuchal Fold:           Not applicable (>20    Cord Vessels:           Previously seen
                        wks GA)
 Face:                  Profile nl; orbits     Kidneys:                Appear normal
                        prev seen
 Lips:                  Previously seen        Bladder:                Appears normal
 Thoracic:              Appears normal         Spine:                  Previously seen
 Heart:                 Appears normal         Upper Extremities:      Previously seen
                        (4CH, axis, and
                        situs)
 RVOT:                  Appears normal         Lower Extremities:      Previously seen
 LVOT:                  Appears normal

 Other:  Heels visualized previously. 5th digit visualized previously. Male
         gender.
Cervix Uterus Adnexa

 Cervix
 Not visualized (advanced GA >28wks)
Impression

 Normal interval growth.
 Relindas Auad
Recommendations

 Repeat growth in 4 weeks.

## 2021-04-23 ENCOUNTER — Encounter (HOSPITAL_COMMUNITY): Payer: Self-pay

## 2021-04-23 ENCOUNTER — Ambulatory Visit (HOSPITAL_COMMUNITY)
Admission: EM | Admit: 2021-04-23 | Discharge: 2021-04-23 | Disposition: A | Discharge status: Home / Self Care | Payer: BC Managed Care – PPO | Attending: Nurse Practitioner | Admitting: Nurse Practitioner

## 2021-04-23 DIAGNOSIS — L02215 Cutaneous abscess of perineum: Secondary | ICD-10-CM

## 2021-04-23 DIAGNOSIS — R300 Dysuria: Secondary | ICD-10-CM | POA: Diagnosis present

## 2021-04-23 DIAGNOSIS — Z113 Encounter for screening for infections with a predominantly sexual mode of transmission: Secondary | ICD-10-CM

## 2021-04-23 LAB — POCT URINALYSIS DIPSTICK, ED / UC
Bilirubin Urine: NEGATIVE
Glucose, UA: NEGATIVE mg/dL
Ketones, ur: NEGATIVE mg/dL
Leukocytes,Ua: NEGATIVE
Nitrite: NEGATIVE
Protein, ur: NEGATIVE mg/dL
Specific Gravity, Urine: 1.025 (ref 1.005–1.030)
Urobilinogen, UA: 1 mg/dL (ref 0.0–1.0)
pH: 7 (ref 5.0–8.0)

## 2021-04-23 LAB — HIV ANTIBODY (ROUTINE TESTING W REFLEX): HIV Screen 4th Generation wRfx: NONREACTIVE

## 2021-04-23 LAB — POC URINE PREG, ED: Preg Test, Ur: NEGATIVE

## 2021-04-23 MED ORDER — DOXYCYCLINE HYCLATE 100 MG PO CAPS: 100.0000 mg | ORAL_CAPSULE | Freq: Two times a day (BID) | ORAL | 0 refills | Status: DC

## 2021-04-23 NOTE — ED Triage Notes (Signed)
Pt would like to be tested for STD testing. She would like for a provider to look at her boils in between her legs.  ?

## 2021-04-23 NOTE — ED Provider Notes (Signed)
?[facility] ? ? ? ?CSN: [id number] ?Arrival date & time: 04/23/21  1553 ? ? ?  ? ?History   ?Chief Complaint ?No chief complaint on file. ? ? ?HPI ?Crystal Walters is a 26 y.o. female.  ? ?Patient reports vaginal discharge, burning with urination is been going on for more than 2 weeks.  She denies any fevers, nausea/vomiting, abdominal pain, flank pain.  She reports there is also vaginal itching.  The discharge is thick and white and nonodorous.  She is also requesting STD testing including HIV and syphilis.  She also reports there is a boil near her buttocks that she would like drained. ? ? ?Past Medical History:  ?Diagnosis Date  ? Chlamydia   ? Depression   ? Seizures (HCC)   ? childhood  ? Vaginal Pap smear, abnormal   ? ? ?Patient Active Problem List  ? Diagnosis Date Noted  ? Indication for care in labor or delivery 10/21/2018  ? ? ?Past Surgical History:  ?Procedure Laterality Date  ? NO PAST SURGERIES    ? ? ?OB History   ? ? Gravida  ?2  ? Para  ?2  ? Term  ?2  ? Preterm  ?   ? AB  ?   ? Living  ?2  ?  ? ? SAB  ?   ? IAB  ?   ? Ectopic  ?   ? Multiple  ?0  ? Live Births  ?2  ?   ?  ?  ? ? ? ?Home Medications   ? ?Prior to Admission medications   ?Medication Sig Start Date End Date Taking? Authorizing Provider  ?doxycycline (VIBRAMYCIN) 100 MG capsule Take 1 capsule (100 mg total) by mouth 2 (two) times daily. 04/23/21  Yes Valentino NoseMartinez, Loella Hickle A, NP  ?omeprazole (PRILOSEC) 20 MG capsule Take 1 capsule (20 mg total) by mouth daily. 02/23/19 03/22/19  Georgetta HaberBurky, Natalie B, NP  ? ? ?Family History ?Family History  ?Problem Relation Age of Onset  ? Hypertension Mother   ? ? ?Social History ?Social History  ? ?Tobacco Use  ? Smoking status: Former  ? Smokeless tobacco: Never  ?Vaping Use  ? Vaping Use: Never used  ?Substance Use Topics  ? Alcohol use: Yes  ?  Comment: occasionally  ? Drug use: Yes  ?  Types: Marijuana  ?  Comment: occasionally  ? ? ? ?Allergies   ?Patient has no known allergies. ? ? ?Review of  Systems ?Review of Systems ?Per HPI ? ?Physical Exam ?Triage Vital Signs ?ED Triage Vitals [04/23/21 1641]  ?Enc Vitals Group  ?   BP 132/76  ?   Pulse Rate 89  ?   Resp 16  ?   Temp 98.3 ?F (36.8 ?C)  ?   Temp Source Oral  ?   SpO2 100 %  ?   Weight   ?   Height   ?   Head Circumference   ?   Peak Flow   ?   Pain Score   ?   Pain Loc   ?   Pain Edu?   ?   Excl. in GC?   ? ?No data found. ? ?Updated Vital Signs ?BP 132/76 (BP Location: Left Arm)   Pulse 89   Temp 98.3 ?F (36.8 ?C) (Oral)   Resp 16   SpO2 100%  ? ?Visual Acuity ?Right Eye Distance:   ?Left Eye Distance:   ?Bilateral Distance:   ? ?Right Eye Near:   ?  Left Eye Near:    ?Bilateral Near:    ? ?Physical Exam ?Vitals and nursing note reviewed. Exam conducted with a chaperone present Garald Balding, RN).  ?Constitutional:   ?   General: She is not in acute distress. ?   Appearance: Normal appearance. She is not toxic-appearing.  ?Genitourinary: ?   Exam position: Lithotomy position.  ?   Pubic Area: No rash.   ?   Labia:     ?   Right: No rash or tenderness.     ?   Left: No rash or tenderness.   ?   Vagina: Tenderness (white, thick, nonodorous) present.  ?   Cervix: Normal.  ?   Uterus: Normal.   ?   Adnexa: Right adnexa normal and left adnexa normal.  ? ? ?   Comments: Firm, flesh-colored abscess to left perineum and area marked.  There is no drainage, fluctuance, warmth, or redness. ?Lymphadenopathy:  ?   Lower Body: No right inguinal adenopathy. No left inguinal adenopathy.  ?Skin: ?   General: Skin is warm and dry.  ?   Coloration: Skin is not jaundiced or pale.  ?   Findings: Abscess present. No erythema.  ?Neurological:  ?   Mental Status: She is alert and oriented to person, place, and time.  ?   Motor: No weakness.  ?   Gait: Gait normal.  ?Psychiatric:     ?   Mood and Affect: Mood normal.     ?   Behavior: Behavior normal.  ? ? ? ?UC Treatments / Results  ?Labs ?(all labs ordered are listed, but only abnormal results are displayed) ?Labs Reviewed   ?POCT URINALYSIS DIPSTICK, ED / UC - Abnormal; Notable for the following components:  ?    Result Value  ? Hgb urine dipstick TRACE (*)   ? All other components within normal limits  ?URINE CULTURE  ?HIV ANTIBODY (ROUTINE TESTING W REFLEX)  ?RPR  ?POC URINE PREG, ED  ?CERVICOVAGINAL ANCILLARY ONLY  ? ? ?EKG ? ? ?Radiology ?No results found. ? ?Procedures ?Procedures (including critical care time) ? ?Medications Ordered in UC ?Medications - No data to display ? ?Initial Impression / Assessment and Plan / UC Course  ?I have reviewed the triage vital signs and the nursing notes. ? ?Pertinent labs & imaging results that were available during my care of the patient were reviewed by me and considered in my medical decision making (see chart for details). ? ?  ?Will screen for STI with Aptima swab and also test for BV and yeast vaginitis.  Treat as indicated.  Check HIV and syphilis per patient request.  Abscess to perineum is firm; incision and drainage not indicated today.  Will treat with doxycyline 100 mg twice daily for 7 days.  UA today shows trace blood; will send for urine culture and treat as indicated.  UPT negative today. ?Final Clinical Impressions(s) / UC Diagnoses  ? ?Final diagnoses:  ?Routine screening for STI (sexually transmitted infection)  ?Dysuria  ?Perineal abscess  ? ? ? ?Discharge Instructions   ? ?  ?- Please start the doxycycline for the abscess in your genitalia area ?- The UA today shows a small amount of blood, no signs of infection otherwise.  We will send this for a urine culture and let you know if we need to treat with different antibiotics.   ?- We will let you know if anything on the vaginal swab requires treatment.   ?- Please use condoms  with every sexual encounter to prevent STI ? ? ? ? ?ED Prescriptions   ? ? Medication Sig Dispense Auth. Provider  ? doxycycline (VIBRAMYCIN) 100 MG capsule Take 1 capsule (100 mg total) by mouth 2 (two) times daily. 14 capsule Valentino Nose, NP   ? ?  ? ?PDMP not reviewed this encounter. ?  ?Valentino Nose, NP ?04/23/21 1743 ? ?

## 2021-04-23 NOTE — Discharge Instructions (Addendum)
-   Please start the doxycycline for the abscess in your genitalia area - The UA today shows a small amount of blood, no signs of infection otherwise.  We will send this for a urine culture and let you know if we need to treat with different antibiotics.   - We will let you know if anything on the vaginal swab requires treatment.   - Please use condoms with every sexual encounter to prevent STI

## 2021-04-24 LAB — CERVICOVAGINAL ANCILLARY ONLY
Bacterial Vaginitis (gardnerella): POSITIVE — AB
Candida Glabrata: NEGATIVE
Candida Vaginitis: NEGATIVE
Chlamydia: NEGATIVE
Comment: NEGATIVE
Comment: NEGATIVE
Comment: NEGATIVE
Comment: NEGATIVE
Comment: NEGATIVE
Comment: NORMAL
Neisseria Gonorrhea: NEGATIVE
Trichomonas: NEGATIVE

## 2021-04-24 LAB — RPR: RPR Ser Ql: NONREACTIVE

## 2021-04-25 LAB — URINE CULTURE

## 2021-04-27 ENCOUNTER — Telehealth (HOSPITAL_COMMUNITY): Payer: Self-pay | Admitting: Emergency Medicine

## 2021-04-27 MED ORDER — METRONIDAZOLE 500 MG PO TABS: 500.0000 mg | ORAL_TABLET | Freq: Two times a day (BID) | ORAL | 0 refills | Status: DC

## 2021-05-22 ENCOUNTER — Encounter (HOSPITAL_COMMUNITY): Payer: Self-pay

## 2021-05-22 ENCOUNTER — Ambulatory Visit (HOSPITAL_COMMUNITY)
Admission: EM | Admit: 2021-05-22 | Discharge: 2021-05-22 | Disposition: A | Discharge status: Home / Self Care | Payer: BC Managed Care – PPO | Attending: Family Medicine | Admitting: Family Medicine

## 2021-05-22 DIAGNOSIS — K0889 Other specified disorders of teeth and supporting structures: Secondary | ICD-10-CM | POA: Diagnosis not present

## 2021-05-22 DIAGNOSIS — H1033 Unspecified acute conjunctivitis, bilateral: Secondary | ICD-10-CM

## 2021-05-22 MED ORDER — IBUPROFEN 800 MG PO TABS: 800.0000 mg | ORAL_TABLET | Freq: Three times a day (TID) | ORAL | 0 refills | Status: DC | PRN

## 2021-05-22 MED ORDER — KETOROLAC TROMETHAMINE 30 MG/ML IJ SOLN
30.0000 mg | Freq: Once | INTRAMUSCULAR | Status: AC
  Administered 2021-05-22: 30 mg via INTRAMUSCULAR

## 2021-05-22 MED ORDER — GENTAMICIN SULFATE 0.3 % OP SOLN: 2.0000 [drp] | Freq: Three times a day (TID) | OPHTHALMIC | 0 refills | Status: AC

## 2021-05-22 MED ORDER — KETOROLAC TROMETHAMINE 30 MG/ML IJ SOLN
INTRAMUSCULAR | Status: AC
  Filled 2021-05-22: qty 1

## 2021-05-22 MED ORDER — AMOXICILLIN-POT CLAVULANATE 875-125 MG PO TABS: 1.0000 | ORAL_TABLET | Freq: Two times a day (BID) | ORAL | 0 refills | Status: AC

## 2021-05-22 NOTE — ED Provider Notes (Signed)
?[facility] ? ? ? ?CSN: [id number] ?Arrival date & time: 05/22/21  1919 ? ? ?  ? ?History   ?Chief Complaint ?Chief Complaint  ?Patient presents with  ? Dental Pain  ? Headache  ? Conjunctivitis  ? ? ?HPI ?Crystal Walters is a 26 y.o. female.  ? ? ?Dental Pain ?Associated symptoms: headaches   ?Headache ?Conjunctivitis ?Associated symptoms include headaches.  ? ?Here for right upper tooth pain in the last 2 days, worsening. Also has some bilateral eye dc/tearing, with irritation. No f/c. ? ?Past Medical History:  ?Diagnosis Date  ? Chlamydia   ? Depression   ? Seizures (HCC)   ? childhood  ? Vaginal Pap smear, abnormal   ? ? ?Patient Active Problem List  ? Diagnosis Date Noted  ? Indication for care in labor or delivery 10/21/2018  ? ? ?Past Surgical History:  ?Procedure Laterality Date  ? NO PAST SURGERIES    ? ? ?OB History   ? ? Gravida  ?2  ? Para  ?2  ? Term  ?2  ? Preterm  ?   ? AB  ?   ? Living  ?2  ?  ? ? SAB  ?   ? IAB  ?   ? Ectopic  ?   ? Multiple  ?0  ? Live Births  ?2  ?   ?  ?  ? ? ? ?Home Medications   ? ?Prior to Admission medications   ?Medication Sig Start Date End Date Taking? Authorizing Provider  ?amoxicillin-clavulanate (AUGMENTIN) 875-125 MG tablet Take 1 tablet by mouth 2 (two) times daily for 7 days. 05/22/21 05/29/21 Yes Zenia Resides, MD  ?gentamicin (GARAMYCIN) 0.3 % ophthalmic solution Place 2 drops into both eyes 3 (three) times daily for 5 days. 05/22/21 05/27/21 Yes Zenia Resides, MD  ?ibuprofen (ADVIL) 800 MG tablet Take 1 tablet (800 mg total) by mouth every 8 (eight) hours as needed (pain). 05/22/21  Yes Zenia Resides, MD  ?omeprazole (PRILOSEC) 20 MG capsule Take 1 capsule (20 mg total) by mouth daily. 02/23/19 03/22/19  Georgetta Haber, NP  ? ? ?Family History ?Family History  ?Problem Relation Age of Onset  ? Hypertension Mother   ? ? ?Social History ?Social History  ? ?Tobacco Use  ? Smoking status: Former  ? Smokeless tobacco: Never  ?Vaping Use  ? Vaping Use:  Never used  ?Substance Use Topics  ? Alcohol use: Yes  ?  Comment: occasionally  ? Drug use: Yes  ?  Types: Marijuana  ?  Comment: occasionally  ? ? ? ?Allergies   ?Patient has no known allergies. ? ? ?Review of Systems ?Review of Systems  ?Neurological:  Positive for headaches.  ? ? ?Physical Exam ?Triage Vital Signs ?ED Triage Vitals [05/22/21 1945]  ?Enc Vitals Group  ?   BP 136/86  ?   Pulse Rate 98  ?   Resp 18  ?   Temp 100 ?F (37.8 ?C)  ?   Temp Source Oral  ?   SpO2 99 %  ?   Weight   ?   Height   ?   Head Circumference   ?   Peak Flow   ?   Pain Score   ?   Pain Loc   ?   Pain Edu?   ?   Excl. in GC?   ? ?No data found. ? ?Updated Vital Signs ?BP 136/86 (BP Location: Left Arm)  Pulse 98   Temp 100 ?F (37.8 ?C) (Oral)   Resp 18   SpO2 99%  ? ?Visual Acuity ?Right Eye Distance:   ?Left Eye Distance:   ?Bilateral Distance:   ? ?Right Eye Near:   ?Left Eye Near:    ?Bilateral Near:    ? ?Physical Exam ?Vitals and nursing note reviewed.  ?Constitutional:   ?   General: She is not in acute distress. ?   Appearance: She is not ill-appearing, toxic-appearing or diaphoretic.  ?HENT:  ?   Mouth/Throat:  ?   Mouth: Mucous membranes are moist.  ?   Comments: There is a broken molar tooth on right upper dental ridge. No erythema/edema ?Eyes:  ?   Extraocular Movements: Extraocular movements intact.  ?   Pupils: Pupils are equal, round, and reactive to light.  ?   Comments: Has some mild injection of both eyes  ?Cardiovascular:  ?   Rate and Rhythm: Normal rate and regular rhythm.  ?   Heart sounds: No murmur heard. ?Pulmonary:  ?   Effort: Pulmonary effort is normal.  ?   Breath sounds: Normal breath sounds.  ?Musculoskeletal:  ?   Cervical back: Neck supple.  ?Lymphadenopathy:  ?   Cervical: No cervical adenopathy.  ?Skin: ?   Coloration: Skin is not jaundiced or pale.  ?Neurological:  ?   Mental Status: She is alert and oriented to person, place, and time.  ?Psychiatric:     ?   Behavior: Behavior normal.   ? ? ? ?UC Treatments / Results  ?Labs ?(all labs ordered are listed, but only abnormal results are displayed) ?Labs Reviewed - No data to display ? ?EKG ? ? ?Radiology ?No results found. ? ?Procedures ?Procedures (including critical care time) ? ?Medications Ordered in UC ?Medications  ?ketorolac (TORADOL) 30 MG/ML injection 30 mg (has no administration in time range)  ? ? ?Initial Impression / Assessment and Plan / UC Course  ?I have reviewed the triage vital signs and the nursing notes. ? ?Pertinent labs & imaging results that were available during my care of the patient were reviewed by me and considered in my medical decision making (see chart for details). ? ?  ?Will treat with antibiotic drops and oral antibiotics for the tooth. ?Final Clinical Impressions(s) / UC Diagnoses  ? ?Final diagnoses:  ?Pain, dental  ?Acute conjunctivitis of both eyes, unspecified acute conjunctivitis type  ? ? ? ?Discharge Instructions   ? ?  ?Take amoxicillin-clavulanate 875 mg--1 tab twice daily with food for 7 days  ? ?Take ibuprofen 800 mg--1 tab every 8 hours as needed for pain.  ? ?Gentamicin eye drops in both eyes 3 times daily for 5 days. ? ?You have been given a shot of Toradol 30 mg today.  ? ? ? ? ?ED Prescriptions   ? ? Medication Sig Dispense Auth. Provider  ? amoxicillin-clavulanate (AUGMENTIN) 875-125 MG tablet Take 1 tablet by mouth 2 (two) times daily for 7 days. 14 tablet Amberlee Garvey, Janace Aris, MD  ? gentamicin (GARAMYCIN) 0.3 % ophthalmic solution Place 2 drops into both eyes 3 (three) times daily for 5 days. 5 mL Zenia Resides, MD  ? ibuprofen (ADVIL) 800 MG tablet Take 1 tablet (800 mg total) by mouth every 8 (eight) hours as needed (pain). 21 tablet Zenia Resides, MD  ? ?  ? ?I have reviewed the PDMP during this encounter. ?  ?Zenia Resides, MD ?05/22/21 2021 ? ?

## 2021-05-22 NOTE — Discharge Instructions (Addendum)
Take amoxicillin-clavulanate 875 mg--1 tab twice daily with food for 7 days  ? ?Take ibuprofen 800 mg--1 tab every 8 hours as needed for pain.  ? ?Gentamicin eye drops in both eyes 3 times daily for 5 days. ? ?You have been given a shot of Toradol 30 mg today.  ?

## 2021-08-07 ENCOUNTER — Ambulatory Visit (HOSPITAL_COMMUNITY)
Admission: EM | Admit: 2021-08-07 | Discharge: 2021-08-07 | Disposition: A | Discharge status: Home / Self Care | Payer: BC Managed Care – PPO | Attending: Internal Medicine | Admitting: Internal Medicine

## 2021-08-07 ENCOUNTER — Encounter (HOSPITAL_COMMUNITY): Payer: Self-pay | Admitting: Emergency Medicine

## 2021-08-07 DIAGNOSIS — Z113 Encounter for screening for infections with a predominantly sexual mode of transmission: Secondary | ICD-10-CM | POA: Insufficient documentation

## 2021-08-07 DIAGNOSIS — N898 Other specified noninflammatory disorders of vagina: Secondary | ICD-10-CM | POA: Diagnosis not present

## 2021-08-07 DIAGNOSIS — N390 Urinary tract infection, site not specified: Secondary | ICD-10-CM | POA: Insufficient documentation

## 2021-08-07 LAB — POCT URINALYSIS DIPSTICK, ED / UC
Bilirubin Urine: NEGATIVE
Glucose, UA: NEGATIVE mg/dL
Ketones, ur: NEGATIVE mg/dL
Nitrite: POSITIVE — AB
Protein, ur: 100 mg/dL — AB
Specific Gravity, Urine: 1.015 (ref 1.005–1.030)
Urobilinogen, UA: 0.2 mg/dL (ref 0.0–1.0)
pH: 8.5 — ABNORMAL HIGH (ref 5.0–8.0)

## 2021-08-07 LAB — POC URINE PREG, ED: Preg Test, Ur: NEGATIVE

## 2021-08-07 MED ORDER — CEPHALEXIN 500 MG PO CAPS: 500.0000 mg | ORAL_CAPSULE | Freq: Four times a day (QID) | ORAL | 0 refills | Status: DC

## 2021-08-07 NOTE — ED Triage Notes (Signed)
Patient c/o vaginal discomfort x 2 days.   Patient denies any vaginal discharge. Patient denies any lesions or skin changes.   Patient endorses strong urine odor.   Patient endorses foul vaginal odor after sex. Patient endorses vaginal burning during sex.   Patient endorses changing sexual partners.   Patient endorses a previous boil in private area that has now gone away but that she is concerned about.   Patient request pelvic exam.   Patient wants full range of STD testing.   Patient hasn't taken any medications for symptoms.

## 2021-08-07 NOTE — ED Provider Notes (Signed)
[facility]    CSN: [id number] Arrival date & time: 08/07/21  1037      History   Chief Complaint Chief Complaint  Patient presents with   Vaginal discomfort     HPI Crystal Walters is a 26 y.o. female.   Patient presents with vaginal irritation and discomfort that has been present for about 2 days.  Patient reports that symptoms started after she had sexual intercourse.  Denies dysuria, urinary frequency, urinary urgency, abdominal pain, pelvic pain, fever, abnormal vaginal bleeding, vaginal discharge.  Denies any known exposure to STD but has had unprotected sexual intercourse recently and a new sexual partner.  Patient would like full STD screening including blood work for HIV and syphilis. Last menstrual cycle was 07/28/21.     Past Medical History:  Diagnosis Date   Chlamydia    Depression    Seizures (HCC)    childhood   Vaginal Pap smear, abnormal     Patient Active Problem List   Diagnosis Date Noted   Indication for care in labor or delivery 10/21/2018    Past Surgical History:  Procedure Laterality Date   NO PAST SURGERIES      OB History     Gravida  2   Para  2   Term  2   Preterm      AB      Living  2      SAB      IAB      Ectopic      Multiple  0   Live Births  2            Home Medications    Prior to Admission medications   Medication Sig Start Date End Date Taking? Authorizing Provider  cephALEXin (KEFLEX) 500 MG capsule Take 1 capsule (500 mg total) by mouth 4 (four) times daily. 08/07/21  Yes , Rolly Salter E, FNP  ibuprofen (ADVIL) 800 MG tablet Take 1 tablet (800 mg total) by mouth every 8 (eight) hours as needed (pain). 05/22/21   Zenia Resides, MD  omeprazole (PRILOSEC) 20 MG capsule Take 1 capsule (20 mg total) by mouth daily. 02/23/19 03/22/19  Georgetta Haber, NP    Family History Family History  Problem Relation Age of Onset   Hypertension Mother     Social History Social History    Tobacco Use   Smoking status: Former   Smokeless tobacco: Never  Building services engineer Use: Never used  Substance Use Topics   Alcohol use: Yes    Comment: occasionally   Drug use: Yes    Types: Marijuana    Comment: occasionally     Allergies   Patient has no known allergies.   Review of Systems Review of Systems Per HPI  Physical Exam Triage Vital Signs ED Triage Vitals  Enc Vitals Group     BP 08/07/21 1151 112/74     Pulse Rate 08/07/21 1151 76     Resp 08/07/21 1151 16     Temp 08/07/21 1151 (!) 97.5 F (36.4 C)     Temp Source 08/07/21 1151 Oral     SpO2 08/07/21 1151 100 %     Weight --      Height --      Head Circumference --      Peak Flow --      Pain Score 08/07/21 1156 0     Pain Loc --      Pain Edu? --  Excl. in GC? --    No data found.  Updated Vital Signs BP 112/74 (BP Location: Left Arm)   Pulse 76   Temp (!) 97.5 F (36.4 C) (Oral)   Resp 16   LMP 07/28/2021 (Exact Date)   SpO2 100%   Visual Acuity Right Eye Distance:   Left Eye Distance:   Bilateral Distance:    Right Eye Near:   Left Eye Near:    Bilateral Near:     Physical Exam Exam conducted with a chaperone present.  Constitutional:      General: She is not in acute distress.    Appearance: Normal appearance. She is not toxic-appearing or diaphoretic.  HENT:     Head: Normocephalic and atraumatic.  Eyes:     Extraocular Movements: Extraocular movements intact.     Conjunctiva/sclera: Conjunctivae normal.  Cardiovascular:     Rate and Rhythm: Normal rate and regular rhythm.     Pulses: Normal pulses.     Heart sounds: Normal heart sounds.  Pulmonary:     Effort: Pulmonary effort is normal. No respiratory distress.     Breath sounds: Normal breath sounds.  Abdominal:     General: Bowel sounds are normal. There is no distension.     Palpations: Abdomen is soft.     Tenderness: There is no abdominal tenderness.  Genitourinary:    Comments: White vaginal  discharge present.  No obvious lesions, adnexal tenderness, cervical friability. Neurological:     General: No focal deficit present.     Mental Status: She is alert and oriented to person, place, and time. Mental status is at baseline.  Psychiatric:        Mood and Affect: Mood normal.        Behavior: Behavior normal.        Thought Content: Thought content normal.        Judgment: Judgment normal.      UC Treatments / Results  Labs (all labs ordered are listed, but only abnormal results are displayed) Labs Reviewed  POCT URINALYSIS DIPSTICK, ED / UC - Abnormal; Notable for the following components:      Result Value   Hgb urine dipstick SMALL (*)    pH 8.5 (*)    Protein, ur 100 (*)    Nitrite POSITIVE (*)    Leukocytes,Ua SMALL (*)    All other components within normal limits  URINE CULTURE  RPR  HIV ANTIBODY (ROUTINE TESTING W REFLEX)  POC URINE PREG, ED  CERVICOVAGINAL ANCILLARY ONLY    EKG   Radiology No results found.  Procedures Procedures (including critical care time)  Medications Ordered in UC Medications - No data to display  Initial Impression / Assessment and Plan / UC Course  I have reviewed the triage vital signs and the nursing notes.  Pertinent labs & imaging results that were available during my care of the patient were reviewed by me and considered in my medical decision making (see chart for details).     Urinalysis indicating urinary tract infection.  Will treat with cephalexin.  Urine culture pending.  Also suspicious that vaginitis could be present so cervicovaginal swab is pending.  Will await results for treatment.  Patient to refrain from sexual activity until test results and treatment are complete.  Discussed return precautions.  Patient verbalized understanding and was agreeable with plan. Final Clinical Impressions(s) / UC Diagnoses   Final diagnoses:  Lower urinary tract infection  Vaginal irritation  Screening examination for  venereal disease     Discharge Instructions      Your urine is showing signs of urinary tract infection.  You have been sent antibiotic to help treat this.  Your vaginal swab and urine culture also pending.  Blood work is also pending.  We will call if there are any abnormal results and treat as appropriate.  Please refrain from sexual activity until test results and treatment are complete.    ED Prescriptions     Medication Sig Dispense Auth. Provider   cephALEXin (KEFLEX) 500 MG capsule Take 1 capsule (500 mg total) by mouth 4 (four) times daily. 28 capsule [place], Acie Fredrickson, [place]      PDMP not reviewed this encounter.   Gustavus Bryant, [place] 08/07/21 1230

## 2021-08-07 NOTE — Discharge Instructions (Signed)
Your urine is showing signs of urinary tract infection.  You have been sent antibiotic to help treat this.  Your vaginal swab and urine culture also pending.  Blood work is also pending.  We will call if there are any abnormal results and treat as appropriate.  Please refrain from sexual activity until test results and treatment are complete.

## 2021-08-08 ENCOUNTER — Ambulatory Visit (HOSPITAL_COMMUNITY)
Admission: EM | Admit: 2021-08-08 | Discharge: 2021-08-08 | Disposition: A | Discharge status: Home / Self Care | Payer: BC Managed Care – PPO

## 2021-08-08 NOTE — ED Notes (Signed)
Pt returned to the office for repeat blood work.

## 2021-08-09 LAB — URINE CULTURE: Culture: >=100000 — AB

## 2021-08-10 LAB — CERVICOVAGINAL ANCILLARY ONLY
Bacterial Vaginitis (gardnerella): POSITIVE — AB
Candida Glabrata: NEGATIVE
Candida Vaginitis: NEGATIVE
Chlamydia: NEGATIVE
Comment: NEGATIVE
Comment: NEGATIVE
Comment: NEGATIVE
Comment: NEGATIVE
Comment: NEGATIVE
Comment: NORMAL
Neisseria Gonorrhea: NEGATIVE
Trichomonas: NEGATIVE

## 2021-08-11 ENCOUNTER — Telehealth (HOSPITAL_COMMUNITY): Payer: Self-pay | Admitting: Emergency Medicine

## 2021-08-11 ENCOUNTER — Ambulatory Visit (HOSPITAL_COMMUNITY)
Admission: EM | Admit: 2021-08-11 | Discharge: 2021-08-11 | Disposition: A | Discharge status: Home / Self Care | Payer: BC Managed Care – PPO | Attending: Internal Medicine | Admitting: Internal Medicine

## 2021-08-11 DIAGNOSIS — Z113 Encounter for screening for infections with a predominantly sexual mode of transmission: Secondary | ICD-10-CM | POA: Insufficient documentation

## 2021-08-11 LAB — HIV ANTIBODY (ROUTINE TESTING W REFLEX): HIV Screen 4th Generation wRfx: NONREACTIVE

## 2021-08-11 MED ORDER — METRONIDAZOLE 500 MG PO TABS: 500.0000 mg | ORAL_TABLET | Freq: Two times a day (BID) | ORAL | 0 refills | Status: DC

## 2021-08-11 NOTE — ED Notes (Signed)
Patient called looking for results of HIV and RPR.  They were never run due to sample being sent down day after order.  Re-ordering test now, lab confirms they can run it for [place]

## 2021-08-11 NOTE — Telephone Encounter (Signed)
Spoke to patient about recent vaginal swab results and she was concerned as she had not seen HIV/RPR results in chart yet.  Reviewed and did not see any either.  Called down to the lab and there was a discrepancy with the order and when the blood arrived.  I will put order back in and they state they can run it today for patient.

## 2021-08-12 LAB — RPR: RPR Ser Ql: NONREACTIVE

## 2021-08-20 ENCOUNTER — Ambulatory Visit (HOSPITAL_COMMUNITY)
Admission: EM | Admit: 2021-08-20 | Discharge: 2021-08-20 | Disposition: A | Discharge status: Home / Self Care | Payer: BC Managed Care – PPO | Attending: Family Medicine | Admitting: Family Medicine

## 2021-08-20 ENCOUNTER — Encounter (HOSPITAL_COMMUNITY): Payer: Self-pay | Admitting: *Deleted

## 2021-08-20 DIAGNOSIS — M545 Low back pain, unspecified: Secondary | ICD-10-CM

## 2021-08-20 NOTE — ED Triage Notes (Signed)
C/O intermittent low back pain over past 2 days. Describes pain as non-radiating. Denies parasthesias. 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

## 2021-08-20 NOTE — Discharge Instructions (Addendum)
You can take Tylenol 500 mg--2 every 6 hours as needed for pain  Or you can take ibuprofen 200 mg--2 every 6-8 hours as needed for pain  Heating pad or heat to the sore area can help  You can use the QR code/website at the back of the summary paperwork to schedule yourself a new patient appointment with primary care

## 2021-08-20 NOTE — ED Provider Notes (Signed)
[facility]    CSN: [id number] Arrival date & time: 08/20/21  1643      History   Chief Complaint Chief Complaint  Patient presents with   Back Pain    HPI Crystal Walters is a 26 y.o. female.    Back Pain  Here for low back pain.  It began about 2 days ago.  No injury or trauma.  It does not radiate down her legs.  She has had no rash or fever or dysuria in the last few days.  No hematuria  Last menstrual period was about July 5.  Hitting and bending at the waist can worsen the pain.  She does lift things in her job    Past Medical History:  Diagnosis Date   Chlamydia    Depression    Seizures (HCC)    childhood   Vaginal Pap smear, abnormal     Patient Active Problem List   Diagnosis Date Noted   Indication for care in labor or delivery 10/21/2018    Past Surgical History:  Procedure Laterality Date   NO PAST SURGERIES      OB History     Gravida  2   Para  2   Term  2   Preterm      AB      Living  2      SAB      IAB      Ectopic      Multiple  0   Live Births  2            Home Medications    Prior to Admission medications   Medication Sig Start Date End Date Taking? Authorizing Provider  omeprazole (PRILOSEC) 20 MG capsule Take 1 capsule (20 mg total) by mouth daily. 02/23/19 03/22/19  Georgetta Haber, NP    Family History Family History  Problem Relation Age of Onset   Hypertension Mother     Social History Social History   Tobacco Use   Smoking status: Some Days    Types: Cigarettes   Smokeless tobacco: Never   Tobacco comments:    Black & Milds  Vaping Use   Vaping Use: Never used  Substance Use Topics   Alcohol use: Yes    Comment: occasionally   Drug use: Yes    Types: Marijuana    Comment: occasionally     Allergies   Patient has no known allergies.   Review of Systems Review of Systems  Musculoskeletal:  Positive for back pain.     Physical Exam Triage Vital  Signs ED Triage Vitals  Enc Vitals Group     BP 08/20/21 1748 120/69     Pulse Rate 08/20/21 1748 79     Resp 08/20/21 1748 16     Temp 08/20/21 1748 98.2 F (36.8 C)     Temp Source 08/20/21 1748 Oral     SpO2 08/20/21 1748 98 %     Weight --      Height --      Head Circumference --      Peak Flow --      Pain Score 08/20/21 1750 7     Pain Loc --      Pain Edu? --      Excl. in GC? --    No data found.  Updated Vital Signs BP 120/69   Pulse 79   Temp 98.2 F (36.8 C) (Oral)   Resp 16  LMP 07/28/2021 (Exact Date)   SpO2 98%   Visual Acuity Right Eye Distance:   Left Eye Distance:   Bilateral Distance:    Right Eye Near:   Left Eye Near:    Bilateral Near:     Physical Exam Vitals reviewed.  Constitutional:      General: She is not in acute distress.    Appearance: She is not ill-appearing, toxic-appearing or diaphoretic.  HENT:     Mouth/Throat:     Mouth: Mucous membranes are moist.  Eyes:     Extraocular Movements: Extraocular movements intact.  Cardiovascular:     Rate and Rhythm: Normal rate and regular rhythm.     Heart sounds: No murmur heard. Pulmonary:     Effort: Pulmonary effort is normal.     Breath sounds: Normal breath sounds.  Musculoskeletal:     Cervical back: Neck supple.     Comments: There is no rash and there is some mild tenderness in her mid lumbosacral area.  Lymphadenopathy:     Cervical: No cervical adenopathy.  Skin:    Coloration: Skin is not jaundiced or pale.  Neurological:     General: No focal deficit present.     Mental Status: She is alert and oriented to person, place, and time.  Psychiatric:        Behavior: Behavior normal.      UC Treatments / Results  Labs (all labs ordered are listed, but only abnormal results are displayed) Labs Reviewed - No data to display  EKG   Radiology No results found.  Procedures Procedures (including critical care time)  Medications Ordered in UC Medications - No  data to display  Initial Impression / Assessment and Plan / UC Course  I have reviewed the triage vital signs and the nursing notes.  Pertinent labs & imaging results that were available during my care of the patient were reviewed by me and considered in my medical decision making (see chart for details).     She is interested in ways to prevent injury for her back and to help this without medication.  She is given some stretching exercises and ways to prevent back injury. Final Clinical Impressions(s) / UC Diagnoses   Final diagnoses:  Acute midline low back pain without sciatica     Discharge Instructions      You can take Tylenol 500 mg--2 every 6 hours as needed for pain  Or you can take ibuprofen 200 mg--2 every 6-8 hours as needed for pain  Heating pad or heat to the sore area can help  You can use the QR code/website at the back of the summary paperwork to schedule yourself a new patient appointment with primary care      ED Prescriptions   None    PDMP not reviewed this encounter.   Zenia Resides, MD 08/20/21 [phone]

## 2021-10-19 ENCOUNTER — Encounter (HOSPITAL_COMMUNITY): Payer: Self-pay | Admitting: Emergency Medicine

## 2021-10-19 ENCOUNTER — Ambulatory Visit (HOSPITAL_COMMUNITY)
Admission: EM | Admit: 2021-10-19 | Discharge: 2021-10-19 | Disposition: A | Discharge status: Home / Self Care | Payer: BC Managed Care – PPO | Attending: Nurse Practitioner | Admitting: Nurse Practitioner

## 2021-10-19 DIAGNOSIS — R300 Dysuria: Secondary | ICD-10-CM | POA: Diagnosis present

## 2021-10-19 DIAGNOSIS — N3000 Acute cystitis without hematuria: Secondary | ICD-10-CM | POA: Diagnosis present

## 2021-10-19 DIAGNOSIS — N898 Other specified noninflammatory disorders of vagina: Secondary | ICD-10-CM | POA: Insufficient documentation

## 2021-10-19 DIAGNOSIS — B3731 Acute candidiasis of vulva and vagina: Secondary | ICD-10-CM | POA: Diagnosis not present

## 2021-10-19 LAB — POCT URINALYSIS DIPSTICK, ED / UC
Bilirubin Urine: NEGATIVE
Glucose, UA: NEGATIVE mg/dL
Nitrite: POSITIVE — AB
Protein, ur: NEGATIVE mg/dL
Specific Gravity, Urine: 1.02 (ref 1.005–1.030)
Urobilinogen, UA: 0.2 mg/dL (ref 0.0–1.0)
pH: 5.5 (ref 5.0–8.0)

## 2021-10-19 LAB — POC URINE PREG, ED: Preg Test, Ur: NEGATIVE

## 2021-10-19 MED ORDER — PHENAZOPYRIDINE HCL 200 MG PO TABS: 200.0000 mg | ORAL_TABLET | Freq: Three times a day (TID) | ORAL | 0 refills | Status: DC

## 2021-10-19 MED ORDER — NITROFURANTOIN MONOHYD MACRO 100 MG PO CAPS: 100.0000 mg | ORAL_CAPSULE | Freq: Two times a day (BID) | ORAL | 0 refills | Status: DC

## 2021-10-19 NOTE — ED Triage Notes (Signed)
Pt reports urine is cloudy and odor for about a week. Reports has white, milky vaginal discharge.

## 2021-10-19 NOTE — Discharge Instructions (Addendum)
You are being treated for a urinary tract infection. Urine cultures as well as testing for bacterial vaginosis, yeast, gonorrhea, chlamydia and trichomonas is pending. You should not have any sexual activity until you receive the results of the tests. You will only be notified for positive results. You may go online to [place] and review your results. Practice safe sex practices by wearing a condom every time you have sex. Remember that people who have STIs may not experience any symptoms. However, even without symptoms, these infections can be spread from person to person and require treatment. STIs can be treated, and many STIs can be cured. However, some STIs cannot be cured and will affect you for the rest of your life. It's important to be checked regularly for STIs. You should also consider taking pre-exposure prophylaxis (PrEP) to prevent HIV infection.

## 2021-10-19 NOTE — ED Provider Notes (Signed)
[place]    CSN: [id number] Arrival date & time: 10/19/21  1731      History   Chief Complaint Chief Complaint  Patient presents with   Dysuria   Vaginal Discharge    HPI Crystal Walters is a 26 y.o. female.   Subjective:   Crystal Walters is a 26 y.o. female who presents for evaluation of dysuria and vaginal discharge. Symptoms have been present for the past week. Symptoms include vaginal discharge described as white, malodorous and milky, vulvar erythema, local irritation and dysuria. Patient reports a history of irregular menses. LMP was 10/12/21. The patient is currently sexually active with 29 different female partners. She does not use any birth control. She has history of chlamydia in the past.   The following portions of the patient's history were reviewed and updated as appropriate: allergies, current medications, past family history, past medical history, past social history, past surgical history, and problem list.       Past Medical History:  Diagnosis Date   Chlamydia    Depression    Seizures ([place])    childhood   Vaginal Pap smear, abnormal     Patient Active Problem List   Diagnosis Date Noted   Indication for care in labor or delivery 10/21/2018    Past Surgical History:  Procedure Laterality Date   NO PAST SURGERIES      OB History     Gravida  2   Para  2   Term  2   Preterm      AB      Living  2      SAB      IAB      Ectopic      Multiple  0   Live Births  2            Home Medications    Prior to Admission medications   Medication Sig Start Date End Date Taking? Authorizing Provider  nitrofurantoin, macrocrystal-monohydrate, (MACROBID) 100 MG capsule Take 1 capsule (100 mg total) by mouth 2 (two) times daily. 10/19/21  Yes Enrique Sack, FNP  phenazopyridine (PYRIDIUM) 200 MG tablet Take 1 tablet (200 mg total) by mouth 3 (three) times daily. 10/19/21  Yes Enrique Sack, FNP  omeprazole  (PRILOSEC) 20 MG capsule Take 1 capsule (20 mg total) by mouth daily. 02/23/19 03/22/19  Zigmund Gottron, NP    Family History Family History  Problem Relation Age of Onset   Hypertension Mother     Social History Social History   Tobacco Use   Smoking status: Some Days    Types: Cigarettes   Smokeless tobacco: Never   Tobacco comments:    Black & Milds  Vaping Use   Vaping Use: Never used  Substance Use Topics   Alcohol use: Yes    Comment: occasionally   Drug use: Yes    Types: Marijuana    Comment: occasionally     Allergies   Patient has no known allergies.   Review of Systems Review of Systems  Constitutional:  Negative for fever.  Gastrointestinal:  Negative for nausea and vomiting.  Genitourinary:  Positive for dysuria and vaginal discharge. Negative for flank pain, frequency and urgency.  Musculoskeletal:  Negative for back pain.  All other systems reviewed and are negative.    Physical Exam Triage Vital Signs ED Triage Vitals  Enc Vitals Group     BP 10/19/21 1835 119/75     Pulse Rate 10/19/21 1835  93     Resp 10/19/21 1835 15     Temp 10/19/21 1835 98.4 F (36.9 C)     Temp Source 10/19/21 1835 Oral     SpO2 10/19/21 1835 98 %     Weight --      Height --      Head Circumference --      Peak Flow --      Pain Score 10/19/21 1834 6     Pain Loc --      Pain Edu? --      Excl. in GC? --    No data found.  Updated Vital Signs BP 119/75 (BP Location: Left Arm)   Pulse 93   Temp 98.4 F (36.9 C) (Oral)   Resp 15   LMP 10/06/2021   SpO2 98%   Visual Acuity Right Eye Distance:   Left Eye Distance:   Bilateral Distance:    Right Eye Near:   Left Eye Near:    Bilateral Near:     Physical Exam Vitals reviewed.  Constitutional:      Appearance: Normal appearance. She is normal weight.  HENT:     Head: Normocephalic.     Nose: Nose normal.  Eyes:     Conjunctiva/sclera: Conjunctivae normal.  Cardiovascular:     Rate and  Rhythm: Normal rate and regular rhythm.  Pulmonary:     Effort: Pulmonary effort is normal.  Abdominal:     Palpations: Abdomen is soft.     Tenderness: There is no right CVA tenderness or left CVA tenderness.  Genitourinary:    Comments: Deferred; patient performed self swab for testing  Musculoskeletal:        General: Normal range of motion.     Cervical back: Normal range of motion.  Skin:    General: Skin is warm and dry.  Neurological:     General: No focal deficit present.     Mental Status: She is alert and oriented to person, place, and time.      UC Treatments / Results  Labs (all labs ordered are listed, but only abnormal results are displayed) Labs Reviewed  POCT URINALYSIS DIPSTICK, ED / UC - Abnormal; Notable for the following components:      Result Value   Ketones, ur TRACE (*)    Hgb urine dipstick SMALL (*)    Nitrite POSITIVE (*)    Leukocytes,Ua SMALL (*)    All other components within normal limits  URINE CULTURE  POC URINE PREG, ED  POC URINE PREG, ED  CERVICOVAGINAL ANCILLARY ONLY    EKG   Radiology No results found.  Procedures Procedures (including critical care time)  Medications Ordered in UC Medications - No data to display  Initial Impression / Assessment and Plan / UC Course  I have reviewed the triage vital signs and the nursing notes.  Pertinent labs & imaging results that were available during my care of the patient were reviewed by me and considered in my medical decision making (see chart for details).    26 year old female presenting with dysuria and vaginal discharge for the past week.  Urinalysis positive for nitrites and leukocytes.  Urine pregnancy negative. Will start empiric treatment for acute cystitis.  Urine cultures as well as cervicovaginal testing for BV, yeast and STIs pending.  Safe sex practices encouraged.  Advised patient to abstain from any sexual activity until the results of the pending test has been  received.  Further recommendations pending the results  of outstanding labs.  Today's evaluation has revealed no signs of a dangerous process. Discussed diagnosis with patient and/or guardian. Patient and/or guardian aware of their diagnosis, possible red flag symptoms to watch out for and need for close follow up. Patient and/or guardian understands verbal and written discharge instructions. Patient and/or guardian comfortable with plan and disposition.  Patient and/or guardian has a clear mental status at this time, good insight into illness (after discussion and teaching) and has clear judgment to make decisions regarding their care  Documentation was completed with the aid of voice recognition software. Transcription may contain typographical errors. Final Clinical Impressions(s) / UC Diagnoses   Final diagnoses:  Dysuria  Acute cystitis without hematuria  Vaginal discharge     Discharge Instructions      You are being treated for a urinary tract infection. Urine cultures as well as testing for bacterial vaginosis, yeast, gonorrhea, chlamydia and trichomonas is pending. You should not have any sexual activity until you receive the results of the tests. You will only be notified for positive results. You may go online to MyChart and review your results. Practice safe sex practices by wearing a condom every time you have sex. Remember that people who have STIs may not experience any symptoms. However, even without symptoms, these infections can be spread from person to person and require treatment. STIs can be treated, and many STIs can be cured. However, some STIs cannot be cured and will affect you for the rest of your life. It's important to be checked regularly for STIs. You should also consider taking pre-exposure prophylaxis (PrEP) to prevent HIV infection.     ED Prescriptions     Medication Sig Dispense Auth. Provider   nitrofurantoin, macrocrystal-monohydrate, (MACROBID) 100 MG  capsule Take 1 capsule (100 mg total) by mouth 2 (two) times daily. 10 capsule Lurline Idol, FNP   phenazopyridine (PYRIDIUM) 200 MG tablet Take 1 tablet (200 mg total) by mouth 3 (three) times daily. 6 tablet Lurline Idol, FNP      PDMP not reviewed this encounter.   Lurline Idol, [place] 10/19/21 1940

## 2021-10-20 LAB — CERVICOVAGINAL ANCILLARY ONLY
Bacterial Vaginitis (gardnerella): POSITIVE — AB
Candida Glabrata: POSITIVE — AB
Candida Vaginitis: POSITIVE — AB
Chlamydia: NEGATIVE
Comment: NEGATIVE
Comment: NEGATIVE
Comment: NEGATIVE
Comment: NEGATIVE
Comment: NEGATIVE
Comment: NORMAL
Neisseria Gonorrhea: NEGATIVE
Trichomonas: NEGATIVE

## 2021-10-21 ENCOUNTER — Telehealth (HOSPITAL_COMMUNITY): Payer: Self-pay | Admitting: Emergency Medicine

## 2021-10-21 LAB — URINE CULTURE: Culture: >=100000 — AB

## 2021-10-21 MED ORDER — FLUCONAZOLE 150 MG PO TABS: 150.0000 mg | ORAL_TABLET | Freq: Once | ORAL | 0 refills | Status: AC

## 2021-10-21 MED ORDER — METRONIDAZOLE 500 MG PO TABS: 500.0000 mg | ORAL_TABLET | Freq: Two times a day (BID) | ORAL | 0 refills | Status: DC

## 2021-10-26 ENCOUNTER — Encounter: Payer: Self-pay | Admitting: Emergency Medicine

## 2021-12-25 ENCOUNTER — Ambulatory Visit (HOSPITAL_COMMUNITY)
Admission: EM | Admit: 2021-12-25 | Discharge: 2021-12-25 | Disposition: A | Discharge status: Home / Self Care | Payer: BC Managed Care – PPO | Attending: Physician Assistant | Admitting: Physician Assistant

## 2021-12-25 ENCOUNTER — Encounter (HOSPITAL_COMMUNITY): Payer: Self-pay

## 2021-12-25 DIAGNOSIS — N760 Acute vaginitis: Secondary | ICD-10-CM

## 2021-12-25 DIAGNOSIS — Z113 Encounter for screening for infections with a predominantly sexual mode of transmission: Secondary | ICD-10-CM

## 2021-12-25 LAB — HEPATITIS PANEL, ACUTE
HCV Ab: NONREACTIVE
Hep A IgM: NONREACTIVE
Hep B C IgM: NONREACTIVE
Hepatitis B Surface Ag: NONREACTIVE

## 2021-12-25 LAB — HIV ANTIBODY (ROUTINE TESTING W REFLEX): HIV Screen 4th Generation wRfx: NONREACTIVE

## 2021-12-25 MED ORDER — FLUCONAZOLE 150 MG PO TABS: ORAL_TABLET | ORAL | 0 refills | Status: DC

## 2021-12-25 NOTE — ED Triage Notes (Signed)
Pt states white vaginal discharge for the past 3 days. Also states she would like to be tested for HIV.

## 2021-12-25 NOTE — ED Provider Notes (Signed)
[facility]    CSN: [id number] Arrival date & time: 12/25/21  1310      History   Chief Complaint Chief Complaint  Patient presents with   Vaginal Discharge    HPI Crystal Walters is a 26 y.o. female.   Patient here today for evaluation of white vaginal discharge that has been present for the last 3 days.  She states that she would also like STD screening including HIV screening.  She has not had any abdominal pain, nausea or vomiting.  She denies any back pain.  She states she thinks she has a yeast infection.   The history is provided by the patient.  Vaginal Discharge Associated symptoms: no abdominal pain, no dysuria, no fever, no nausea and no vomiting     Past Medical History:  Diagnosis Date   Chlamydia    Depression    Seizures (HCC)    childhood   Vaginal Pap smear, abnormal     Patient Active Problem List   Diagnosis Date Noted   Indication for care in labor or delivery 10/21/2018    Past Surgical History:  Procedure Laterality Date   NO PAST SURGERIES      OB History     Gravida  2   Para  2   Term  2   Preterm      AB      Living  2      SAB      IAB      Ectopic      Multiple  0   Live Births  2            Home Medications    Prior to Admission medications   Medication Sig Start Date End Date Taking? Authorizing Provider  fluconazole (DIFLUCAN) 150 MG tablet Take one tab PO today and repeat dose in 3 days if symptoms persist 12/25/21  Yes Tomi Bamberger, PA-C  metroNIDAZOLE (FLAGYL) 500 MG tablet Take 1 tablet (500 mg total) by mouth 2 (two) times daily. 10/21/21   Lamptey, Britta Mccreedy, MD  nitrofurantoin, macrocrystal-monohydrate, (MACROBID) 100 MG capsule Take 1 capsule (100 mg total) by mouth 2 (two) times daily. 10/19/21   Lurline Idol, FNP  phenazopyridine (PYRIDIUM) 200 MG tablet Take 1 tablet (200 mg total) by mouth 3 (three) times daily. 10/19/21   Lurline Idol, FNP  omeprazole (PRILOSEC) 20 MG  capsule Take 1 capsule (20 mg total) by mouth daily. 02/23/19 03/22/19  Georgetta Haber, NP    Family History Family History  Problem Relation Age of Onset   Hypertension Mother     Social History Social History   Tobacco Use   Smoking status: Some Days    Types: Cigarettes   Smokeless tobacco: Never   Tobacco comments:    Black & Milds  Vaping Use   Vaping Use: Never used  Substance Use Topics   Alcohol use: Yes    Comment: occasionally   Drug use: Yes    Types: Marijuana    Comment: occasionally     Allergies   Patient has no known allergies.   Review of Systems Review of Systems  Constitutional:  Negative for chills and fever.  Eyes:  Negative for discharge and redness.  Gastrointestinal:  Negative for abdominal pain, nausea and vomiting.  Genitourinary:  Positive for vaginal discharge. Negative for dysuria and genital sores.  Musculoskeletal:  Negative for back pain.     Physical Exam Triage Vital Signs ED Triage  Vitals  Enc Vitals Group     BP 12/25/21 1359 119/70     Pulse Rate 12/25/21 1359 97     Resp 12/25/21 1359 16     Temp 12/25/21 1359 99.7 F (37.6 C)     Temp Source 12/25/21 1359 Oral     SpO2 12/25/21 1359 96 %     Weight --      Height --      Head Circumference --      Peak Flow --      Pain Score 12/25/21 1400 0     Pain Loc --      Pain Edu? --      Excl. in GC? --    No data found.  Updated Vital Signs BP 119/70 (BP Location: Right Arm)   Pulse 97   Temp 99.7 F (37.6 C) (Oral)   Resp 16   LMP 12/09/2021 (Exact Date)   SpO2 96%      Physical Exam Vitals and nursing note reviewed.  Constitutional:      General: She is not in acute distress.    Appearance: Normal appearance. She is not ill-appearing.  HENT:     Head: Normocephalic and atraumatic.  Eyes:     Conjunctiva/sclera: Conjunctivae normal.  Cardiovascular:     Rate and Rhythm: Normal rate.  Pulmonary:     Effort: Pulmonary effort is normal.   Neurological:     Mental Status: She is alert.  Psychiatric:        Mood and Affect: Mood normal.        Behavior: Behavior normal.        Thought Content: Thought content normal.      UC Treatments / Results  Labs (all labs ordered are listed, but only abnormal results are displayed) Labs Reviewed  HIV ANTIBODY (ROUTINE TESTING W REFLEX)  HEPATITIS PANEL, ACUTE  RPR  CERVICOVAGINAL ANCILLARY ONLY    EKG   Radiology No results found.  Procedures Procedures (including critical care time)  Medications Ordered in UC Medications - No data to display  Initial Impression / Assessment and Plan / UC Course  I have reviewed the triage vital signs and the nursing notes.  Pertinent labs & imaging results that were available during my care of the patient were reviewed by me and considered in my medical decision making (see chart for details).   Will treat to cover yeast vaginitis. Std screening ordered. Will await results for further recommendation, encouraged follow up with any further concerns.    Final Clinical Impressions(s) / UC Diagnoses   Final diagnoses:  Screening for STD (sexually transmitted disease)  Acute vaginitis   Discharge Instructions   None    ED Prescriptions     Medication Sig Dispense Auth. Provider   fluconazole (DIFLUCAN) 150 MG tablet Take one tab PO today and repeat dose in 3 days if symptoms persist 2 tablet Tomi Bamberger, PA-C      PDMP not reviewed this encounter.   Tomi Bamberger, PA-C 12/25/21 1438

## 2021-12-26 LAB — RPR: RPR Ser Ql: NONREACTIVE

## 2021-12-28 LAB — CERVICOVAGINAL ANCILLARY ONLY
Bacterial Vaginitis (gardnerella): NEGATIVE
Candida Glabrata: NEGATIVE
Candida Vaginitis: POSITIVE — AB
Chlamydia: NEGATIVE
Comment: NEGATIVE
Comment: NEGATIVE
Comment: NEGATIVE
Comment: NEGATIVE
Comment: NEGATIVE
Comment: NORMAL
Neisseria Gonorrhea: NEGATIVE
Trichomonas: NEGATIVE

## 2022-01-15 ENCOUNTER — Ambulatory Visit (HOSPITAL_COMMUNITY)
Admission: EM | Admit: 2022-01-15 | Discharge: 2022-01-15 | Discharge status: Against Medical Advice | Payer: BC Managed Care – PPO

## 2022-01-15 NOTE — ED Notes (Signed)
2nd call in lobby no answer

## 2022-01-15 NOTE — ED Notes (Signed)
Called in lobby, no answer 

## 2022-01-15 NOTE — ED Notes (Signed)
No answer from lobby x 3 attempts  

## 2022-01-15 NOTE — ED Notes (Signed)
Called mobile phone, no answer.

## 2022-01-16 ENCOUNTER — Encounter (HOSPITAL_COMMUNITY): Payer: Self-pay | Admitting: Emergency Medicine

## 2022-01-16 ENCOUNTER — Ambulatory Visit (HOSPITAL_COMMUNITY)
Admission: EM | Admit: 2022-01-16 | Discharge: 2022-01-16 | Disposition: A | Discharge status: Home / Self Care | Payer: BC Managed Care – PPO | Attending: Internal Medicine | Admitting: Internal Medicine

## 2022-01-16 DIAGNOSIS — R350 Frequency of micturition: Secondary | ICD-10-CM | POA: Insufficient documentation

## 2022-01-16 DIAGNOSIS — N898 Other specified noninflammatory disorders of vagina: Secondary | ICD-10-CM | POA: Insufficient documentation

## 2022-01-16 LAB — POCT URINALYSIS DIPSTICK, ED / UC
Bilirubin Urine: NEGATIVE
Glucose, UA: NEGATIVE mg/dL
Ketones, ur: NEGATIVE mg/dL
Nitrite: NEGATIVE
Protein, ur: NEGATIVE mg/dL
Specific Gravity, Urine: 1.025 (ref 1.005–1.030)
Urobilinogen, UA: 0.2 mg/dL (ref 0.0–1.0)
pH: 5.5 (ref 5.0–8.0)

## 2022-01-16 MED ORDER — METRONIDAZOLE 500 MG PO TABS: 500.0000 mg | ORAL_TABLET | Freq: Two times a day (BID) | ORAL | 0 refills | Status: DC

## 2022-01-16 MED ORDER — CEPHALEXIN 500 MG PO CAPS: 500.0000 mg | ORAL_CAPSULE | Freq: Two times a day (BID) | ORAL | 0 refills | Status: AC

## 2022-01-16 MED ORDER — FLUCONAZOLE 150 MG PO TABS: ORAL_TABLET | ORAL | 0 refills | Status: DC

## 2022-01-16 NOTE — ED Triage Notes (Signed)
Pt reports white vaginal discharge, vaginal irritation and swelling x 3 days. Also endorses urine being a darker color. Denies urinary frequency. . States she noticed the same symptoms each time she uses a sex toy. Reports cleaning the toy before and after use.

## 2022-01-16 NOTE — ED Provider Notes (Signed)
[facility]    CSN: [id number] Arrival date & time: 01/16/22  1003      History   Chief Complaint Chief Complaint  Patient presents with   Vaginal Irritation    HPI Charlyne Robertshaw is a 26 y.o. female.   Patient presents urgent care for evaluation of creamy white vaginal discharge with odor for the last few days.  She also reports vaginal itching, vaginal irritation, and urinary frequency over the last few days as well.  Denies dysuria, vaginal rash, urinary urgency, urinary hesitancy, nausea, vomiting, abdominal pain, flank pain, low back pain, body aches, fever/chills, and constipation.  She states this happens whenever she uses a new sex toy.  She states she cleans the toilet after each use.  Reports new sexual partner without condom use.  Denies known exposure to STD.  Denies frequent intake of urinary irritants and recent antibiotic use.  Vaginal irritation is worse after intercourse, she does not use extra lubricant during intercourse but believes she may need to start doing this.  Denies chance of pregnancy.  Last menstrual cycle was 01/08/2022 (last week).     Past Medical History:  Diagnosis Date   Chlamydia    Depression    Seizures (HCC)    childhood   Vaginal Pap smear, abnormal     Patient Active Problem List   Diagnosis Date Noted   Indication for care in labor or delivery 10/21/2018    Past Surgical History:  Procedure Laterality Date   NO PAST SURGERIES      OB History     Gravida  2   Para  2   Term  2   Preterm      AB      Living  2      SAB      IAB      Ectopic      Multiple  0   Live Births  2            Home Medications    Prior to Admission medications   Medication Sig Start Date End Date Taking? Authorizing Provider  cephALEXin (KEFLEX) 500 MG capsule Take 1 capsule (500 mg total) by mouth 2 (two) times daily for 7 days. 01/16/22 01/23/22 Yes Carlisle Beers, FNP  fluconazole (DIFLUCAN) 150 MG  tablet Take one tab PO today and repeat dose in 3 days if symptoms persist 01/16/22   Carlisle Beers, FNP  metroNIDAZOLE (FLAGYL) 500 MG tablet Take 1 tablet (500 mg total) by mouth 2 (two) times daily. 01/16/22   Carlisle Beers, FNP  phenazopyridine (PYRIDIUM) 200 MG tablet Take 1 tablet (200 mg total) by mouth 3 (three) times daily. 10/19/21   Lurline Idol, FNP  omeprazole (PRILOSEC) 20 MG capsule Take 1 capsule (20 mg total) by mouth daily. 02/23/19 03/22/19  Georgetta Haber, NP    Family History Family History  Problem Relation Age of Onset   Hypertension Mother     Social History Social History   Tobacco Use   Smoking status: Some Days    Types: Cigarettes   Smokeless tobacco: Never   Tobacco comments:    Black & Milds  Vaping Use   Vaping Use: Never used  Substance Use Topics   Alcohol use: Yes    Comment: occasionally   Drug use: Yes    Types: Marijuana    Comment: occasionally     Allergies   Patient has no known allergies.   Review  of Systems Review of Systems Per HPI  Physical Exam Triage Vital Signs ED Triage Vitals [01/16/22 1018]  Enc Vitals Group     BP 113/72     Pulse Rate 87     Resp 17     Temp 98.8 F (37.1 C)     Temp Source Oral     SpO2 97 %     Weight      Height      Head Circumference      Peak Flow      Pain Score 9     Pain Loc      Pain Edu?      Excl. in GC?    No data found.  Updated Vital Signs BP 113/72 (BP Location: Right Arm)   Pulse 87   Temp 98.8 F (37.1 C) (Oral)   Resp 17   LMP 01/08/2022 (Exact Date)   SpO2 97%   Visual Acuity Right Eye Distance:   Left Eye Distance:   Bilateral Distance:    Right Eye Near:   Left Eye Near:    Bilateral Near:     Physical Exam Vitals and nursing note reviewed.  Constitutional:      Appearance: She is not ill-appearing or toxic-appearing.  HENT:     Head: Normocephalic and atraumatic.     Right Ear: Hearing and external ear normal.     Left  Ear: Hearing and external ear normal.     Nose: Nose normal.     Mouth/Throat:     Lips: Pink.  Eyes:     General: Lids are normal. Vision grossly intact. Gaze aligned appropriately.     Extraocular Movements: Extraocular movements intact.     Conjunctiva/sclera: Conjunctivae normal.  Pulmonary:     Effort: Pulmonary effort is normal.  Abdominal:     General: Abdomen is flat.     Tenderness: There is no abdominal tenderness. There is no right CVA tenderness or left CVA tenderness.  Genitourinary:    Comments: Deferred. Musculoskeletal:     Cervical back: Neck supple.  Skin:    General: Skin is warm and dry.     Capillary Refill: Capillary refill takes less than 2 seconds.     Findings: No rash.  Neurological:     General: No focal deficit present.     Mental Status: She is alert and oriented to person, place, and time. Mental status is at baseline.     Cranial Nerves: No dysarthria or facial asymmetry.  Psychiatric:        Mood and Affect: Mood normal.        Speech: Speech normal.        Behavior: Behavior normal.        Thought Content: Thought content normal.        Judgment: Judgment normal.      UC Treatments / Results  Labs (all labs ordered are listed, but only abnormal results are displayed) Labs Reviewed  POCT URINALYSIS DIPSTICK, ED / UC - Abnormal; Notable for the following components:      Result Value   Hgb urine dipstick MODERATE (*)    Leukocytes,Ua SMALL (*)    All other components within normal limits  CERVICOVAGINAL ANCILLARY ONLY    EKG   Radiology No results found.  Procedures Procedures (including critical care time)  Medications Ordered in UC Medications - No data to display  Initial Impression / Assessment and Plan / UC Course  I have reviewed  the triage vital signs and the nursing notes.  Pertinent labs & imaging results that were available during my care of the patient were reviewed by me and considered in my medical decision  making (see chart for details).   1.  Urinary frequency Urinalysis shows small leukocytes and moderate hemoglobin.  This is consistent with urinary tract infection based on patient's symptoms.  Will go ahead and treat with Keflex twice daily for the next 7 days.  Urine culture is pending.  We will change treatment based on urine culture results if necessary.  Advised patient to avoid use of urinary irritants and increase water intake to at least 64 ounces of water per day to stay well-hydrated.  She is to urinate after intercourse to prevent UTIs in the future.  2.  Vaginal discharge, vaginal itching Cervicovaginal ancillary swab is pending.  She declines HIV and syphilis testing.  Plan to go ahead and treat for bacterial vaginosis and vaginal yeast infection as symptoms are classic and she has had both of these in the past.  Flagyl twice daily for the next 7 days sent to pharmacy.  No alcohol during course of Flagyl and for 48 hours after each course of Flagyl due to risk of nausea and vomiting side effect.  Diflucan once today, then again in 3 days if symptoms fail to improve after first dose.  Will treat for all other STDs based on STD testing results.  Advised to avoid intercourse for 7 days should she test positive for any other STIs.   Discussed physical exam and available lab work findings in clinic with patient.  Counseled patient regarding appropriate use of medications and potential side effects for all medications recommended or prescribed today. Discussed red flag signs and symptoms of worsening condition,when to call the PCP office, return to urgent care, and when to seek higher level of care in the emergency department. Patient verbalizes understanding and agreement with plan. All questions answered. Patient discharged in stable condition.    Final Clinical Impressions(s) / UC Diagnoses   Final diagnoses:  Urinary frequency  Vaginal discharge  Vaginal itching     Discharge  Instructions      Take flagyl 2 times daily for the next 7 days to treat suspected bacterial vaginosis. Take Diflucan once today, then again in 3 days if symptoms fail to improve.  Your STD testing will come back in the next 3 to 4 days.  We will call you if any of your results are abnormal or change treatment plan.  Do not have intercourse until your STD testing comes back.  If you are positive for any STDs, you must wait 7 days in order to have intercourse while you are being treated.  Increase your water intake to stay well-hydrated (64 ounces of water per day).  Your urine shows that you likely have a urinary tract infection. I have prescribed Keflex for you to take 500 mg twice daily for the next 7 days.  If you develop diarrhea while taking this medication you may purchase an over-the-counter probiotic or eat yogurt with live active cultures.  To avoid GI upset please take this medication with food. I have sent your urine for culture to see what type of bacteria grows. We will call you if we need to change the treatment plan based on the results of your urine culture.  If you develop any new or worsening symptoms or do not improve in the next 2 to 3 days, please return.  If your symptoms are severe, please go to the emergency room.  Follow-up with your primary care provider for further evaluation and management of your symptoms as well as ongoing wellness visits.  I hope you feel better!      ED Prescriptions     Medication Sig Dispense Auth. Provider   cephALEXin (KEFLEX) 500 MG capsule Take 1 capsule (500 mg total) by mouth 2 (two) times daily for 7 days. 14 capsule Reita May M, FNP   metroNIDAZOLE (FLAGYL) 500 MG tablet Take 1 tablet (500 mg total) by mouth 2 (two) times daily. 14 tablet Reita May M, FNP   fluconazole (DIFLUCAN) 150 MG tablet Take one tab PO today and repeat dose in 3 days if symptoms persist 2 tablet Stevie Kern Donavan Burnet, FNP      PDMP not  reviewed this encounter.   Carlisle Beers, [place] 01/16/22 1101

## 2022-01-16 NOTE — Discharge Instructions (Addendum)
Take flagyl 2 times daily for the next 7 days to treat suspected bacterial vaginosis. Take Diflucan once today, then again in 3 days if symptoms fail to improve.  Your STD testing will come back in the next 3 to 4 days.  We will call you if any of your results are abnormal or change treatment plan.  Do not have intercourse until your STD testing comes back.  If you are positive for any STDs, you must wait 7 days in order to have intercourse while you are being treated.  Increase your water intake to stay well-hydrated (64 ounces of water per day).  Your urine shows that you likely have a urinary tract infection. I have prescribed Keflex for you to take 500 mg twice daily for the next 7 days.  If you develop diarrhea while taking this medication you may purchase an over-the-counter probiotic or eat yogurt with live active cultures.  To avoid GI upset please take this medication with food. I have sent your urine for culture to see what type of bacteria grows. We will call you if we need to change the treatment plan based on the results of your urine culture.  If you develop any new or worsening symptoms or do not improve in the next 2 to 3 days, please return.  If your symptoms are severe, please go to the emergency room.  Follow-up with your primary care provider for further evaluation and management of your symptoms as well as ongoing wellness visits.  I hope you feel better!

## 2022-01-17 LAB — URINE CULTURE: Culture: 80000 — AB

## 2022-01-19 ENCOUNTER — Telehealth (HOSPITAL_COMMUNITY): Payer: Self-pay | Admitting: Emergency Medicine

## 2022-01-19 LAB — CERVICOVAGINAL ANCILLARY ONLY
Bacterial Vaginitis (gardnerella): POSITIVE — AB
Candida Glabrata: NEGATIVE
Candida Vaginitis: POSITIVE — AB
Chlamydia: NEGATIVE
Comment: NEGATIVE
Comment: NEGATIVE
Comment: NEGATIVE
Comment: NEGATIVE
Comment: NEGATIVE
Comment: NORMAL
Neisseria Gonorrhea: NEGATIVE
Trichomonas: NEGATIVE

## 2022-01-19 NOTE — Telephone Encounter (Signed)
Opened in error

## 2022-05-13 ENCOUNTER — Ambulatory Visit (HOSPITAL_COMMUNITY)
Admission: EM | Admit: 2022-05-13 | Discharge: 2022-05-13 | Disposition: A | Discharge status: Home / Self Care | Payer: BC Managed Care – PPO | Attending: Internal Medicine | Admitting: Internal Medicine

## 2022-05-13 ENCOUNTER — Encounter (HOSPITAL_COMMUNITY): Payer: Self-pay

## 2022-05-13 DIAGNOSIS — N907 Vulvar cyst: Secondary | ICD-10-CM | POA: Diagnosis not present

## 2022-05-13 DIAGNOSIS — N898 Other specified noninflammatory disorders of vagina: Secondary | ICD-10-CM | POA: Diagnosis not present

## 2022-05-13 DIAGNOSIS — Z113 Encounter for screening for infections with a predominantly sexual mode of transmission: Secondary | ICD-10-CM

## 2022-05-13 LAB — POCT URINALYSIS DIP (MANUAL ENTRY)
Bilirubin, UA: NEGATIVE
Glucose, UA: NEGATIVE mg/dL
Ketones, POC UA: NEGATIVE mg/dL
Leukocytes, UA: NEGATIVE
Nitrite, UA: NEGATIVE
Protein Ur, POC: NEGATIVE mg/dL
Spec Grav, UA: 1.025 (ref 1.010–1.025)
Urobilinogen, UA: 0.2 E.U./dL
pH, UA: 6 (ref 5.0–8.0)

## 2022-05-13 LAB — POCT URINE PREGNANCY: Preg Test, Ur: NEGATIVE

## 2022-05-13 LAB — HIV ANTIBODY (ROUTINE TESTING W REFLEX): HIV Screen 4th Generation wRfx: NONREACTIVE

## 2022-05-13 MED ORDER — FLUCONAZOLE 150 MG PO TABS: ORAL_TABLET | ORAL | 0 refills | Status: DC

## 2022-05-13 MED ORDER — SULFAMETHOXAZOLE-TRIMETHOPRIM 800-160 MG PO TABS: 1.0000 | ORAL_TABLET | Freq: Two times a day (BID) | ORAL | 0 refills | Status: AC

## 2022-05-13 MED ORDER — MUPIROCIN 2 % EX OINT: 1.0000 | TOPICAL_OINTMENT | Freq: Two times a day (BID) | CUTANEOUS | 0 refills | Status: DC

## 2022-05-13 NOTE — ED Provider Notes (Signed)
[facility]    CSN: [id number] Arrival date & time: 05/13/22  4098      History   Chief Complaint Chief Complaint  Patient presents with   Exposure to STD   Urinary Tract Infection   Mass    HPI Crystal Walters is a 27 y.o. female.   27 year old female presents with concern over "bump" on her labia that has been present for the past 2 to 3 days. She has tried to squeeze it and "poked"the boil with a needle and some thick discharge was released but still very swollen and more pus still present. Has been applying warm compresses with some relief but still very painful. Used Darene Lamer recently for hair removal in the area. Has had small boils/cysts in the past in the genital area. Last antibiotic that was given for this condition was not successful (thought it may be Doxycycline)- had to change to different antibiotic. Also has noticed urine is more cloudy but denies any dysuria. Has had 1 partner in the past few months. Requests STI testing including HIV. Has history of recurrent BV. Last tested and treated December 2023 with Flagyl. LMP about 1 month ago. No other current chronic health issues. Takes no daily medication.   The history is provided by the patient.    Past Medical History:  Diagnosis Date   Chlamydia    Depression    Seizures    childhood   Vaginal Pap smear, abnormal     Patient Active Problem List   Diagnosis Date Noted   Indication for care in labor or delivery 10/21/2018    Past Surgical History:  Procedure Laterality Date   NO PAST SURGERIES      OB History     Gravida  2   Para  2   Term  2   Preterm      AB      Living  2      SAB      IAB      Ectopic      Multiple  0   Live Births  2            Home Medications    Prior to Admission medications   Medication Sig Start Date End Date Taking? Authorizing Provider  mupirocin ointment (BACTROBAN) 2 % Apply 1 Application topically 2 (two) times daily. To affected  area for at least 5 days then as needed. 05/13/22  Yes Miasha Emmons, Ali Lowe, NP  sulfamethoxazole-trimethoprim (BACTRIM DS) 800-160 MG tablet Take 1 tablet by mouth 2 (two) times daily for 7 days. 05/13/22 05/20/22 Yes Elli Groesbeck, Ali Lowe, NP  fluconazole (DIFLUCAN) 150 MG tablet Take one tab PO tomorrow and repeat dose in 3 days to prevent antibiotic associated yeast infection 05/13/22   Sudie Grumbling, NP  omeprazole (PRILOSEC) 20 MG capsule Take 1 capsule (20 mg total) by mouth daily. 02/23/19 03/22/19  Georgetta Haber, NP    Family History Family History  Problem Relation Age of Onset   Hypertension Mother     Social History Social History   Tobacco Use   Smoking status: Some Days    Types: Cigarettes   Smokeless tobacco: Never   Tobacco comments:    Black & Milds  Vaping Use   Vaping Use: Never used  Substance Use Topics   Alcohol use: Yes    Comment: occasionally   Drug use: Yes    Types: Marijuana    Comment: occasionally  Allergies   Patient has no known allergies.   Review of Systems Review of Systems  Constitutional:  Negative for activity change, appetite change, chills, fatigue and fever.  HENT:  Negative for mouth sores and sore throat.   Gastrointestinal:  Negative for abdominal pain, nausea and vomiting.  Genitourinary:  Positive for genital sores and vaginal discharge (slight). Negative for difficulty urinating, dysuria, flank pain, frequency, hematuria, pelvic pain, urgency and vaginal bleeding.  Musculoskeletal:  Negative for arthralgias and back pain.  Skin:  Positive for color change and wound (abscess/boil). Negative for rash.  Allergic/Immunologic: Negative for environmental allergies and food allergies.  Neurological:  Negative for dizziness, tremors, syncope, numbness and headaches.  Hematological:  Negative for adenopathy. Does not bruise/bleed easily.     Physical Exam Triage Vital Signs ED Triage Vitals [05/13/22 0843]  Enc Vitals Group     BP  129/74     Pulse Rate 97     Resp 16     Temp 98.4 F (36.9 C)     Temp Source Oral     SpO2 98 %     Weight      Height      Head Circumference      Peak Flow      Pain Score      Pain Loc      Pain Edu?      Excl. in GC?    No data found.  Updated Vital Signs BP 129/74 (BP Location: Left Arm)   Pulse 97   Temp 98.4 F (36.9 C) (Oral)   Resp 16   LMP 04/16/2022   SpO2 98%   Visual Acuity Right Eye Distance:   Left Eye Distance:   Bilateral Distance:    Right Eye Near:   Left Eye Near:    Bilateral Near:     Physical Exam Vitals and nursing note reviewed. Exam conducted with a chaperone present (Demetrice, CMA present during pelvic/genital exam).  Constitutional:      General: She is awake. She is not in acute distress.    Appearance: She is well-developed.     Comments: She is sitting on the exam table in no acute distress, talking on the phone the entire time during the exam, even during the pelvic exam.   HENT:     Head: Normocephalic and atraumatic.     Right Ear: Hearing normal.     Left Ear: Hearing normal.  Cardiovascular:     Rate and Rhythm: Normal rate and regular rhythm.     Heart sounds: Normal heart sounds. No murmur heard. Pulmonary:     Effort: Pulmonary effort is normal. No respiratory distress.     Breath sounds: Normal breath sounds and air entry. No decreased air movement. No decreased breath sounds, wheezing, rhonchi or rales.  Abdominal:     General: Abdomen is flat. Bowel sounds are normal. There is no distension.     Palpations: Abdomen is soft.     Tenderness: There is no abdominal tenderness. There is no right CVA tenderness, left CVA tenderness, guarding or rebound.  Genitourinary:    Exam position: Lithotomy position.     Pubic Area: No rash.      Labia:        Right: No rash, tenderness or lesion.        Left: Tenderness and lesion present. No rash.        Comments: No internal exam performed. 10mm round raised abscess  present on  left upper outer labia. Very tender. Center has white to yellowish discharge slightly draining. Left labia very swollen and red. No other lesions noted.  Patient obtained self-swab for STI testing before provider performed pelvic exam.  Musculoskeletal:        General: Normal range of motion.     Cervical back: Normal range of motion.  Lymphadenopathy:     Lower Body: No right inguinal adenopathy. No left inguinal adenopathy.  Skin:    General: Skin is warm.     Capillary Refill: Capillary refill takes less than 2 seconds.     Findings: Abscess and erythema present. No abrasion or bruising.  Neurological:     General: No focal deficit present.     Mental Status: She is alert and oriented to person, place, and time.  Psychiatric:        Attention and Perception: Attention normal.        Mood and Affect: Affect is labile.        Speech: Speech normal.        Behavior: Behavior is cooperative.        Thought Content: Thought content normal.     Comments: She was nervous and slightly anxious during exam. She talked on the phone and did not hang up when we were discussing very intimate physical findings during and after exam.       UC Treatments / Results  Labs (all labs ordered are listed, but only abnormal results are displayed) Labs Reviewed  POCT URINALYSIS DIP (MANUAL ENTRY) - Abnormal; Notable for the following components:      Result Value   Blood, UA small (*)    All other components within normal limits  HIV ANTIBODY (ROUTINE TESTING W REFLEX)  POCT URINE PREGNANCY  CERVICOVAGINAL ANCILLARY ONLY    EKG   Radiology No results found.  Procedures Incision and Drainage  Date/Time: 05/13/2022 9:45 AM  Performed by: Sudie Grumbling, NP Authorized by: Sudie Grumbling, NP   Consent:    Consent obtained:  Verbal   Consent given by:  Patient   Risks, benefits, and alternatives were discussed: yes     Risks discussed:  Bleeding, incomplete drainage,  infection and pain   Alternatives discussed:  Alternative treatment Universal protocol:    Procedure explained and questions answered to patient or proxy's satisfaction: yes     Patient identity confirmed:  Verbally with patient and hospital-assigned identification number Location:    Type:  Abscess   Size:  10mm   Location:  Anogenital   Anogenital location:  Vulva (left labia) Pre-procedure details:    Procedure prep: cleaned with alcohol pads. Sedation:    Sedation type:  None Anesthesia:    Anesthesia method:  None Procedure type:    Complexity:  Simple Procedure details:    Ultrasound guidance: no     Needle aspiration: no     Incision types:  Stab incision   Incision depth:  Dermal   Drainage:  Bloody and purulent   Drainage amount:  Moderate   Wound treatment:  Wound left open   Packing materials:  None Post-procedure details:    Procedure completion:  Tolerated well, no immediate complications Comments:     Abscess already had central opening- slightly widened opening by about 4mm, white to yellowish discharge expressed. Some discharge still remained and was not able to be expressed due to patient discomfort. Left wound open to drain and applied gauze to area. Pad was given to patient  to wear but patient did not have any underwear with her- will take home for later use.   (including critical care time)  Medications Ordered in UC Medications - No data to display  Initial Impression / Assessment and Plan / UC Course  I have reviewed the triage vital signs and the nursing notes.  Pertinent labs & imaging results that were available during my care of the patient were reviewed by me and considered in my medical decision making (see chart for details).     Reviewed negative urine pregnancy test and essentially negative urinalysis test results with patient. No distinct findings supporting UTI- did not send urine for culture.  Reviewed with patient that we I&D her abscess  on her labia and this will continue to drain. Encouraged to soak in warm water in bathtub to continue to facilitate drainage. May apply Bactroban ointment to area twice a day for at least 5 days and then as needed until healed. Since patient did not do well on Doxycyline in past and was most recently on Keflex, will trial Bactrim DS 1 tablet twice a day for 7 days for labial infection. May also start Diflucan 150mg  one tablet- take tomorrow and then repeat 1 tablet in 3 days to help prevent antibiotic-induced yeast infection. May need Flagyl since patient almost always positive for BV with testing on vaginal swab but patient not currently symptomatic. Continue to push fluids to help decrease haziness of urine. Discouraged patient from continued Darene Lamer use and discussed abscess/cyst formation common after shaving. Encouraged to use condoms with each and every future sexual encounter. Follow-up pending lab results.    Final Clinical Impressions(s) / UC Diagnoses   Final diagnoses:  Labial cyst  Vaginal irritation  Vaginal discharge  Screening for STD (sexually transmitted disease)     Discharge Instructions      We opened up your cyst/abscess on your labia today to help it drain and to help decrease the infection. Recommend soak in a bathtub 2 to 3 times a day to help it drain and with comfort. May apply Bactroban ointment twice a day to affected area for at least 5 days and then longer if needed. Start Bactrim antibiotic 1 tablet twice a day for 7 days. Also start Diflucan 150mg  tablet- take one tomorrow and then repeat in 3 days to help prevent antibiotic associated yeast infection. Continue to push fluids. Follow-up pending lab results.     ED Prescriptions     Medication Sig Dispense Auth. Provider   sulfamethoxazole-trimethoprim (BACTRIM DS) 800-160 MG tablet Take 1 tablet by mouth 2 (two) times daily for 7 days. 14 tablet Sudie Grumbling, NP   mupirocin ointment (BACTROBAN) 2 % Apply 1  Application topically 2 (two) times daily. To affected area for at least 5 days then as needed. 15 g Sudie Grumbling, NP   fluconazole (DIFLUCAN) 150 MG tablet Take one tab PO tomorrow and repeat dose in 3 days to prevent antibiotic associated yeast infection 2 tablet Leanda Padmore, Ali Lowe, NP      PDMP not reviewed this encounter.   Sudie Grumbling, NP 05/14/22 1037

## 2022-05-13 NOTE — Discharge Instructions (Signed)
We opened up your cyst/abscess on your labia today to help it drain and to help decrease the infection. Recommend soak in a bathtub 2 to 3 times a day to help it drain and with comfort. May apply Bactroban ointment twice a day to affected area for at least 5 days and then longer if needed. Start Bactrim antibiotic 1 tablet twice a day for 7 days. Also start Diflucan  tablet- take one tomorrow and then repeat in 3 days to help prevent antibiotic associated yeast infection. Continue to push fluids. Follow-up pending lab results.

## 2022-05-13 NOTE — ED Triage Notes (Signed)
Pt presents to office for STI-testing. Pt would like blood work for HIV. Pt states she has a bump on her vaginal lips.

## 2022-05-14 LAB — CERVICOVAGINAL ANCILLARY ONLY
Bacterial Vaginitis (gardnerella): POSITIVE — AB
Candida Glabrata: NEGATIVE
Candida Vaginitis: POSITIVE — AB
Chlamydia: NEGATIVE
Comment: NEGATIVE
Comment: NEGATIVE
Comment: NEGATIVE
Comment: NEGATIVE
Comment: NEGATIVE
Comment: NORMAL
Neisseria Gonorrhea: NEGATIVE
Trichomonas: NEGATIVE

## 2022-05-15 ENCOUNTER — Telehealth (HOSPITAL_COMMUNITY): Payer: Self-pay | Admitting: Emergency Medicine

## 2022-05-15 MED ORDER — METRONIDAZOLE 500 MG PO TABS: 500.0000 mg | ORAL_TABLET | Freq: Two times a day (BID) | ORAL | 0 refills | Status: DC

## 2022-06-02 ENCOUNTER — Ambulatory Visit (HOSPITAL_COMMUNITY)
Admission: EM | Admit: 2022-06-02 | Discharge: 2022-06-02 | Disposition: A | Discharge status: Home / Self Care | Payer: BC Managed Care – PPO | Attending: Emergency Medicine | Admitting: Emergency Medicine

## 2022-06-02 ENCOUNTER — Encounter (HOSPITAL_COMMUNITY): Payer: Self-pay | Admitting: Emergency Medicine

## 2022-06-02 DIAGNOSIS — R300 Dysuria: Secondary | ICD-10-CM | POA: Diagnosis not present

## 2022-06-02 DIAGNOSIS — N898 Other specified noninflammatory disorders of vagina: Secondary | ICD-10-CM | POA: Insufficient documentation

## 2022-06-02 DIAGNOSIS — Z113 Encounter for screening for infections with a predominantly sexual mode of transmission: Secondary | ICD-10-CM | POA: Diagnosis not present

## 2022-06-02 LAB — POCT URINALYSIS DIP (MANUAL ENTRY)
Bilirubin, UA: NEGATIVE
Glucose, UA: NEGATIVE mg/dL
Nitrite, UA: NEGATIVE
Protein Ur, POC: 30 mg/dL — AB
Spec Grav, UA: 1.025 (ref 1.010–1.025)
Urobilinogen, UA: 0.2 E.U./dL
pH, UA: 6 (ref 5.0–8.0)

## 2022-06-02 LAB — POCT URINE PREGNANCY: Preg Test, Ur: NEGATIVE

## 2022-06-02 MED ORDER — CEPHALEXIN 500 MG PO CAPS: 500.0000 mg | ORAL_CAPSULE | Freq: Two times a day (BID) | ORAL | 0 refills | Status: AC

## 2022-06-02 NOTE — ED Provider Notes (Signed)
[facility]    CSN: [id number] Arrival date & time: 06/02/22  1931     History   Chief Complaint Chief Complaint  Patient presents with   Dysuria   Vaginal Discharge    HPI Crystal Walters is a 27 y.o. female.  About a week of dysuria on and off 2 day history of vaginal discharge and itching. Some odor. Recent unprotected intercourse, requesting STD testing No flank pain or fever. Denies abd pain, NVD, lesions or rash  LMP 4/20  Past Medical History:  Diagnosis Date   Chlamydia    Depression    Seizures (HCC)    childhood   Vaginal Pap smear, abnormal     Patient Active Problem List   Diagnosis Date Noted   Indication for care in labor or delivery 10/21/2018    Past Surgical History:  Procedure Laterality Date   NO PAST SURGERIES      OB History     Gravida  2   Para  2   Term  2   Preterm      AB      Living  2      SAB      IAB      Ectopic      Multiple  0   Live Births  2            Home Medications    Prior to Admission medications   Medication Sig Start Date End Date Taking? Authorizing Provider  cephALEXin (KEFLEX) 500 MG capsule Take 1 capsule (500 mg total) by mouth 2 (two) times daily for 5 days. 06/02/22 06/07/22 Yes Page Pucciarelli, Lurena Joiner, PA-C  omeprazole (PRILOSEC) 20 MG capsule Take 1 capsule (20 mg total) by mouth daily. 02/23/19 03/22/19  Georgetta Haber, NP    Family History Family History  Problem Relation Age of Onset   Hypertension Mother     Social History Social History   Tobacco Use   Smoking status: Some Days    Types: Cigarettes   Smokeless tobacco: Never   Tobacco comments:    Black & Milds  Vaping Use   Vaping Use: Never used  Substance Use Topics   Alcohol use: Yes    Comment: occasionally   Drug use: Yes    Types: Marijuana    Comment: occasionally     Allergies   Patient has no known allergies.   Review of Systems Review of Systems As per HPI  Physical Exam Triage Vital  Signs ED Triage Vitals  Enc Vitals Group     BP 06/02/22 2004 109/69     Pulse Rate 06/02/22 2004 82     Resp 06/02/22 2004 14     Temp 06/02/22 2004 98.1 F (36.7 C)     Temp Source 06/02/22 2004 Oral     SpO2 06/02/22 2004 97 %     Weight --      Height --      Head Circumference --      Peak Flow --      Pain Score 06/02/22 2003 4     Pain Loc --      Pain Edu? --      Excl. in GC? --    No data found.  Updated Vital Signs BP 109/69 (BP Location: Right Arm)   Pulse 82   Temp 98.1 F (36.7 C) (Oral)   Resp 14   LMP 05/15/2022   SpO2 97%     Physical  Exam Vitals and nursing note reviewed.  Constitutional:      General: She is not in acute distress. HENT:     Mouth/Throat:     Mouth: Mucous membranes are moist.     Pharynx: Oropharynx is clear.  Eyes:     Conjunctiva/sclera: Conjunctivae normal.     Pupils: Pupils are equal, round, and reactive to light.  Cardiovascular:     Rate and Rhythm: Normal rate and regular rhythm.     Heart sounds: Normal heart sounds.  Pulmonary:     Effort: Pulmonary effort is normal.     Breath sounds: Normal breath sounds.  Abdominal:     General: Bowel sounds are normal.     Palpations: Abdomen is soft.     Tenderness: There is no abdominal tenderness. There is no right CVA tenderness or left CVA tenderness.  Neurological:     Mental Status: She is alert and oriented to person, place, and time.     UC Treatments / Results  Labs (all labs ordered are listed, but only abnormal results are displayed) Labs Reviewed  POCT URINALYSIS DIP (MANUAL ENTRY) - Abnormal; Notable for the following components:      Result Value   Clarity, UA hazy (*)    Ketones, POC UA trace (5) (*)    Blood, UA small (*)    Protein Ur, POC =30 (*)    Leukocytes, UA Trace (*)    All other components within normal limits  URINE CULTURE  POCT URINE PREGNANCY  CERVICOVAGINAL ANCILLARY ONLY    EKG  Radiology No results  found.  Procedures Procedures (including critical care time)  Medications Ordered in UC Medications - No data to display  Initial Impression / Assessment and Plan / UC Course  I have reviewed the triage vital signs and the nursing notes.  Pertinent labs & imaging results that were available during my care of the patient were reviewed by me and considered in my medical decision making (see chart for details).  UA small RBCs, trace leuks. Culture is pending Treat with keflex BID x 5 days per patient request. Will call if need to D/C or change abx based on culture  UPT negative Cytology swab pending. Will treat positive result if needed No questions at this time  Final Clinical Impressions(s) / UC Diagnoses   Final diagnoses:  Vaginal discharge  Screen for STD (sexually transmitted disease)  Dysuria     Discharge Instructions      We will call you if anything on your vaginal swab returns positive. Please abstain from sexual intercourse until your results return.  We will also call you if anything grows on the urine culture and requires a change in antibiotics. This can take 2-3 days. Please drink at least 64 oz of water daily!     ED Prescriptions     Medication Sig Dispense Auth. Provider   cephALEXin (KEFLEX) 500 MG capsule Take 1 capsule (500 mg total) by mouth 2 (two) times daily for 5 days. 10 capsule Calina Patrie, Lurena Joiner, PA-C      PDMP not reviewed this encounter.   Kayann Maj, Ray Church 06/02/22 2040

## 2022-06-02 NOTE — ED Triage Notes (Signed)
Pt reports urine had different color and little painful for a couple days. Reports vaginal discharge and itching since Monday after intercourse. Requesting STD testing.

## 2022-06-02 NOTE — Discharge Instructions (Addendum)
We will call you if anything on your vaginal swab returns positive. Please abstain from sexual intercourse until your results return.  We will also call you if anything grows on the urine culture and requires a change in antibiotics. This can take 2-3 days. Please drink at least 64 oz of water daily!

## 2022-06-03 LAB — CERVICOVAGINAL ANCILLARY ONLY
Bacterial Vaginitis (gardnerella): NEGATIVE
Candida Glabrata: POSITIVE — AB
Candida Vaginitis: POSITIVE — AB
Chlamydia: NEGATIVE
Comment: NEGATIVE
Comment: NEGATIVE
Comment: NEGATIVE
Comment: NEGATIVE
Comment: NEGATIVE
Comment: NORMAL
Neisseria Gonorrhea: NEGATIVE
Trichomonas: NEGATIVE

## 2022-06-03 LAB — URINE CULTURE: Culture: NO GROWTH

## 2022-06-08 ENCOUNTER — Telehealth (HOSPITAL_COMMUNITY): Payer: Self-pay | Admitting: Emergency Medicine

## 2022-06-08 MED ORDER — FLUCONAZOLE 150 MG PO TABS: 150.0000 mg | ORAL_TABLET | Freq: Once | ORAL | 0 refills | Status: AC

## 2022-07-15 ENCOUNTER — Other Ambulatory Visit: Payer: Self-pay

## 2022-07-15 ENCOUNTER — Encounter (HOSPITAL_COMMUNITY): Payer: Self-pay | Admitting: Emergency Medicine

## 2022-07-15 ENCOUNTER — Ambulatory Visit (HOSPITAL_COMMUNITY)
Admission: EM | Admit: 2022-07-15 | Discharge: 2022-07-15 | Disposition: A | Discharge status: Home / Self Care | Payer: BC Managed Care – PPO | Attending: Sports Medicine | Admitting: Sports Medicine

## 2022-07-15 DIAGNOSIS — N898 Other specified noninflammatory disorders of vagina: Secondary | ICD-10-CM

## 2022-07-15 DIAGNOSIS — R300 Dysuria: Secondary | ICD-10-CM

## 2022-07-15 DIAGNOSIS — Z113 Encounter for screening for infections with a predominantly sexual mode of transmission: Secondary | ICD-10-CM | POA: Insufficient documentation

## 2022-07-15 DIAGNOSIS — Z7251 High risk heterosexual behavior: Secondary | ICD-10-CM | POA: Diagnosis present

## 2022-07-15 LAB — POCT URINALYSIS DIP (MANUAL ENTRY)
Bilirubin, UA: NEGATIVE
Blood, UA: NEGATIVE
Glucose, UA: NEGATIVE mg/dL
Ketones, POC UA: NEGATIVE mg/dL
Nitrite, UA: NEGATIVE
Protein Ur, POC: NEGATIVE mg/dL
Spec Grav, UA: 1.025 (ref 1.010–1.025)
Urobilinogen, UA: 0.2 E.U./dL
pH, UA: 6.5 (ref 5.0–8.0)

## 2022-07-15 LAB — HIV ANTIBODY (ROUTINE TESTING W REFLEX): HIV Screen 4th Generation wRfx: NONREACTIVE

## 2022-07-15 LAB — POCT URINE PREGNANCY: Preg Test, Ur: NEGATIVE

## 2022-07-15 MED ORDER — PHENAZOPYRIDINE HCL 200 MG PO TABS: 200.0000 mg | ORAL_TABLET | Freq: Three times a day (TID) | ORAL | 0 refills | Status: DC

## 2022-07-15 NOTE — ED Provider Notes (Signed)
[facility]    CSN: [id number] Arrival date & time: 07/15/22  0831      History   Chief Complaint Chief Complaint  Patient presents with   SEXUALLY TRANSMITTED DISEASE   Urinary Tract Infection    HPI Crystal Walters is a 27 y.o. female.   She is here today with chief complaint of small amount of vaginal discharge and dysuria.  She reports her dysuria began 2 days ago and has improved since she started drinking more water.  She reports her vaginal discharge is thin and white chunky on her underwear.  She was previously treated last month for a yeast infection.  She states it is not quite as copious as last month but a small amount.  She is requesting STI testing today.  She is had a new partner recently and unprotected sex.  She is not currently on any form of birth control and does not desire pregnancy at this time.  She denies any fevers chills nausea or vomiting.   Urinary Tract Infection   Past Medical History:  Diagnosis Date   Chlamydia    Depression    Seizures (HCC)    childhood   Vaginal Pap smear, abnormal     Patient Active Problem List   Diagnosis Date Noted   Indication for care in labor or delivery 10/21/2018    Past Surgical History:  Procedure Laterality Date   NO PAST SURGERIES      OB History     Gravida  2   Para  2   Term  2   Preterm      AB      Living  2      SAB      IAB      Ectopic      Multiple  0   Live Births  2            Home Medications    Prior to Admission medications   Medication Sig Start Date End Date Taking? Authorizing Provider  omeprazole (PRILOSEC) 20 MG capsule Take 1 capsule (20 mg total) by mouth daily. 02/23/19 03/22/19  Georgetta Haber, NP    Family History Family History  Problem Relation Age of Onset   Hypertension Mother     Social History Social History   Tobacco Use   Smoking status: Former    Types: Cigarettes   Smokeless tobacco: Never   Tobacco comments:     Black & Milds  Vaping Use   Vaping Use: Never used  Substance Use Topics   Alcohol use: Yes    Comment: occasionally   Drug use: Yes    Types: Marijuana    Comment: occasionally     Allergies   Patient has no known allergies.   Review of Systems Review of Systems as listed above in HPI   Physical Exam Triage Vital Signs ED Triage Vitals [07/15/22 0908]  Enc Vitals Group     BP 108/64     Pulse Rate 86     Resp 18     Temp 98.2 F (36.8 C)     Temp Source Oral     SpO2 99 %     Weight      Height      Head Circumference      Peak Flow      Pain Score      Pain Loc      Pain Edu?  Excl. in GC?    No data found.  Updated Vital Signs BP 108/64 (BP Location: Left Arm)   Pulse 86   Temp 98.2 F (36.8 C) (Oral)   Resp 18   LMP 07/04/2022   SpO2 99%   Physical Exam Vitals reviewed.  Constitutional:      General: She is not in acute distress.    Appearance: Normal appearance. She is normal weight. She is not ill-appearing, toxic-appearing or diaphoretic.  HENT:     Head: Normocephalic.  Cardiovascular:     Rate and Rhythm: Normal rate.  Pulmonary:     Effort: Pulmonary effort is normal.  Skin:    General: Skin is warm.  Neurological:     Mental Status: She is alert.  Psychiatric:        Mood and Affect: Mood normal.        Behavior: Behavior normal.        Thought Content: Thought content normal.        Judgment: Judgment normal.      UC Treatments / Results  Labs (all labs ordered are listed, but only abnormal results are displayed) Labs Reviewed  POCT URINALYSIS DIP (MANUAL ENTRY) - Abnormal; Notable for the following components:      Result Value   Leukocytes, UA Moderate (2+) (*)    All other components within normal limits  URINE CULTURE  HIV ANTIBODY (ROUTINE TESTING W REFLEX)  RPR  POCT URINE PREGNANCY  CERVICOVAGINAL ANCILLARY ONLY    EKG   Radiology No results found.  Procedures Procedures (including critical care  time)  Medications Ordered in UC Medications - No data to display  Initial Impression / Assessment and Plan / UC Course  I have reviewed the triage vital signs and the nursing notes.  Pertinent labs & imaging results that were available during my care of the patient were reviewed by me and considered in my medical decision making (see chart for details).     Dysuria UA today was negative for bacteria and nitrites.  Moderate leukocytes.  Urine culture was sent to the lab today.  I would recommend holding off on treatment as she had similar results last month with no bacteria growth on urine culture.  I will send her pharmacy some phenazopyridine to help with the discomfort while you are awaiting her results STI testing Collected today vaginal swab and blood work.  Urgent care staff will call her with abnormal results and appropriate treatment.  Recommend abstaining from sexual intercourse until your results have returned and you and your partner have been treated appropriately. Follow-up here with your primary care provider if symptoms worsen or fail to improve Final Clinical Impressions(s) / UC Diagnoses   Final diagnoses:  None   Discharge Instructions   None    ED Prescriptions   None    PDMP not reviewed this encounter.   Claudie Leach, DO 07/15/22 [phone]

## 2022-07-15 NOTE — ED Triage Notes (Signed)
Denies vaginal discharge.  Reports slight abdominal pain.  Onset 2 days ago .  Reports a strong odor to urine.    Has not taken any medicines for symptoms  Patient requesting full sti testing including HIV  Patient seen 06/02/2022 for similar complaints and requests

## 2022-07-15 NOTE — ED Notes (Signed)
Patient requested using bathroom during intake-held urine for testing

## 2022-07-15 NOTE — Discharge Instructions (Signed)
We have tested you today for STIs, urgent care staff will call you with abnormal results.  Recommend you abstain from sexual intercourse until your results have come back and you and your partners have been treated appropriately.  Recommend safe sexual practices with condoms to avoid pregnancy if unwanted and STIs. Your urine today had 1 small marker of infection however we will hold off on treatment until your urine culture returns.  I have sent to your pharmacy medication to help with the burning when you pee.  Will call you with appropriate medication if needed for urinary tract infection.

## 2022-07-16 LAB — URINE CULTURE: Culture: 40000 — AB

## 2022-07-16 LAB — RPR: RPR Ser Ql: NONREACTIVE

## 2022-07-19 ENCOUNTER — Telehealth (HOSPITAL_COMMUNITY): Payer: Self-pay | Admitting: Emergency Medicine

## 2022-07-19 LAB — CERVICOVAGINAL ANCILLARY ONLY
Bacterial Vaginitis (gardnerella): POSITIVE — AB
Candida Glabrata: NEGATIVE
Candida Vaginitis: NEGATIVE
Chlamydia: NEGATIVE
Comment: NEGATIVE
Comment: NEGATIVE
Comment: NEGATIVE
Comment: NEGATIVE
Comment: NEGATIVE
Comment: NORMAL
Neisseria Gonorrhea: NEGATIVE
Trichomonas: NEGATIVE

## 2022-07-19 MED ORDER — METRONIDAZOLE 500 MG PO TABS: 500.0000 mg | ORAL_TABLET | Freq: Two times a day (BID) | ORAL | 0 refills | Status: DC

## 2022-07-22 ENCOUNTER — Telehealth: Payer: Self-pay | Admitting: General Practice

## 2022-07-22 ENCOUNTER — Ambulatory Visit: Payer: BC Managed Care – PPO | Admitting: Family Medicine

## 2022-07-22 NOTE — Telephone Encounter (Signed)
Pt cancelled at 2:30 for her 3:40 appt said she thought I hour was good enough. She is blocked.

## 2022-08-06 ENCOUNTER — Ambulatory Visit (HOSPITAL_COMMUNITY)
Admission: EM | Admit: 2022-08-06 | Discharge: 2022-08-06 | Disposition: A | Discharge status: Home / Self Care | Payer: BC Managed Care – PPO | Attending: Emergency Medicine | Admitting: Emergency Medicine

## 2022-08-06 DIAGNOSIS — Z113 Encounter for screening for infections with a predominantly sexual mode of transmission: Secondary | ICD-10-CM | POA: Diagnosis not present

## 2022-08-06 DIAGNOSIS — N898 Other specified noninflammatory disorders of vagina: Secondary | ICD-10-CM | POA: Diagnosis present

## 2022-08-06 DIAGNOSIS — N760 Acute vaginitis: Secondary | ICD-10-CM | POA: Diagnosis present

## 2022-08-06 LAB — POCT URINALYSIS DIP (MANUAL ENTRY)
Bilirubin, UA: NEGATIVE
Glucose, UA: NEGATIVE mg/dL
Ketones, POC UA: NEGATIVE mg/dL
Leukocytes, UA: NEGATIVE
Nitrite, UA: NEGATIVE
Protein Ur, POC: NEGATIVE mg/dL
Spec Grav, UA: >=1.03 — AB (ref 1.010–1.025)
Urobilinogen, UA: 0.2 E.U./dL
pH, UA: 6 (ref 5.0–8.0)

## 2022-08-06 LAB — POCT URINE PREGNANCY: Preg Test, Ur: NEGATIVE

## 2022-08-06 MED ORDER — PHENAZOPYRIDINE HCL 200 MG PO TABS: 200.0000 mg | ORAL_TABLET | Freq: Three times a day (TID) | ORAL | 0 refills | Status: DC | PRN

## 2022-08-06 MED ORDER — METRONIDAZOLE 500 MG PO TABS: 500.0000 mg | ORAL_TABLET | Freq: Two times a day (BID) | ORAL | 0 refills | Status: DC

## 2022-08-06 NOTE — ED Provider Notes (Signed)
[facility]    CSN: [id number] Arrival date & time: 08/06/22  0102      History   Chief Complaint Chief Complaint  Patient presents with   Vaginal Discharge    HPI Crystal Walters is a 27 y.o. female.   Patient presents to clinic with vaginal discharge, vaginal discomfort, vaginal odor, and dysuria.  She noticed the change to her discharge 2 days ago.  Dysuria started yesterday.  She is sexually active.  Reports her last menstrual cycle was July 2.  She denies any fevers, denies nausea or vomiting.  No vaginal sores or lesions.  She is sexually active.  Has a history of yeast and BV, reports this feels like BV.    The history is provided by the patient and medical records.  Vaginal Discharge Associated symptoms: dysuria   Associated symptoms: no abdominal pain, no fever, no nausea and no vomiting     Past Medical History:  Diagnosis Date   Chlamydia    Depression    Seizures (HCC)    childhood   Vaginal Pap smear, abnormal     Patient Active Problem List   Diagnosis Date Noted   Indication for care in labor or delivery 10/21/2018    Past Surgical History:  Procedure Laterality Date   NO PAST SURGERIES      OB History     Gravida  2   Para  2   Term  2   Preterm      AB      Living  2      SAB      IAB      Ectopic      Multiple  0   Live Births  2            Home Medications    Prior to Admission medications   Medication Sig Start Date End Date Taking? Authorizing Provider  metroNIDAZOLE (FLAGYL) 500 MG tablet Take 1 tablet (500 mg total) by mouth 2 (two) times daily. 08/06/22  Yes Rinaldo Ratel, [place] N, FNP  phenazopyridine (PYRIDIUM) 200 MG tablet Take 1 tablet (200 mg total) by mouth 3 (three) times daily as needed for pain. 08/06/22   Tarique Loveall, [place] N, FNP  omeprazole (PRILOSEC) 20 MG capsule Take 1 capsule (20 mg total) by mouth daily. 02/23/19 03/22/19  Georgetta Haber, NP    Family History Family History   Problem Relation Age of Onset   Hypertension Mother     Social History Social History   Tobacco Use   Smoking status: Former    Types: Cigarettes   Smokeless tobacco: Never   Tobacco comments:    Black & Milds  Vaping Use   Vaping status: Never Used  Substance Use Topics   Alcohol use: Yes    Comment: occasionally   Drug use: Yes    Types: Marijuana    Comment: occasionally     Allergies   Patient has no known allergies.   Review of Systems Review of Systems  Constitutional:  Negative for fever.  Gastrointestinal:  Negative for abdominal pain, nausea and vomiting.  Genitourinary:  Positive for dysuria, vaginal discharge and vaginal pain. Negative for flank pain, genital sores, menstrual problem and vaginal bleeding.  Musculoskeletal:  Negative for back pain.     Physical Exam Triage Vital Signs ED Triage Vitals [08/06/22 0952]  Encounter Vitals Group     BP 118/75     Systolic BP Percentile  Diastolic BP Percentile      Pulse Rate 99     Resp 18     Temp 98.5 F (36.9 C)     Temp Source Oral     SpO2 99 %     Weight      Height      Head Circumference      Peak Flow      Pain Score      Pain Loc      Pain Education      Exclude from Growth Chart    No data found.  Updated Vital Signs BP 118/75 (BP Location: Left Arm)   Pulse 99   Temp 98.5 F (36.9 C) (Oral)   Resp 18   LMP 07/04/2022   SpO2 99%   Visual Acuity Right Eye Distance:   Left Eye Distance:   Bilateral Distance:    Right Eye Near:   Left Eye Near:    Bilateral Near:     Physical Exam Vitals and nursing note reviewed.  Constitutional:      Appearance: Normal appearance.  HENT:     Head: Normocephalic and atraumatic.     Right Ear: External ear normal.     Left Ear: External ear normal.     Nose: Nose normal.     Mouth/Throat:     Mouth: Mucous membranes are moist.  Eyes:     Conjunctiva/sclera: Conjunctivae normal.  Cardiovascular:     Rate and Rhythm: Normal  rate.  Pulmonary:     Effort: Pulmonary effort is normal. No respiratory distress.  Musculoskeletal:        General: Normal range of motion.  Skin:    General: Skin is warm and dry.  Neurological:     General: No focal deficit present.     Mental Status: She is alert and oriented to person, place, and time.  Psychiatric:        Mood and Affect: Mood normal.      UC Treatments / Results  Labs (all labs ordered are listed, but only abnormal results are displayed) Labs Reviewed  POCT URINALYSIS DIP (MANUAL ENTRY) - Abnormal; Notable for the following components:      Result Value   Spec Grav, UA >=1.030 (*)    Blood, UA trace-lysed (*)    All other components within normal limits  URINE CULTURE  POCT URINE PREGNANCY  CERVICOVAGINAL ANCILLARY ONLY    EKG   Radiology No results found.  Procedures Procedures (including critical care time)  Medications Ordered in UC Medications - No data to display  Initial Impression / Assessment and Plan / UC Course  I have reviewed the triage vital signs and the nursing notes.  Pertinent labs & imaging results that were available during my care of the patient were reviewed by me and considered in my medical decision making (see chart for details).  Vitals and triage reviewed, patient is hemodynamically stable.  Reports dysuria and changes to vaginal discharge.  Urinalysis with trace red blood cells and high specific gravity, will send for culture.  Lower suspicion for urinary tract infection.  Reports symptoms consistent with BV, will treat with Flagyl.  Encouraged not to consume alcohol on this medication.  Cytology swab obtained, staff to contact if we need to modify the treatment plan.  Plan of care, follow-up care and return precautions given, no questions at this time.     Final Clinical Impressions(s) / UC Diagnoses   Final diagnoses:  Acute  vaginitis  Vaginal discharge  Screening examination for sexually transmitted disease    Discharge Instructions   None    ED Prescriptions     Medication Sig Dispense Auth. Provider   metroNIDAZOLE (FLAGYL) 500 MG tablet Take 1 tablet (500 mg total) by mouth 2 (two) times daily. 14 tablet Rinaldo Ratel, [place] N, [place]   phenazopyridine (PYRIDIUM) 200 MG tablet Take 1 tablet (200 mg total) by mouth 3 (three) times daily as needed for pain. 6 tablet Cleaster Shiffer, [place] N, [place]      PDMP not reviewed this encounter.   Farrah Skoda, [place] N, [place] 08/06/22 1016

## 2022-08-06 NOTE — ED Triage Notes (Signed)
Here for vaginal discharge. Pt would like her urine check and sti testing.

## 2022-08-07 LAB — URINE CULTURE: Culture: 30000 — AB

## 2022-08-09 LAB — CERVICOVAGINAL ANCILLARY ONLY
Bacterial Vaginitis (gardnerella): NEGATIVE
Candida Glabrata: POSITIVE — AB
Candida Vaginitis: NEGATIVE
Chlamydia: NEGATIVE
Comment: NEGATIVE
Comment: NEGATIVE
Comment: NEGATIVE
Comment: NEGATIVE
Comment: NEGATIVE
Comment: NORMAL
Neisseria Gonorrhea: NEGATIVE
Trichomonas: NEGATIVE

## 2022-08-10 ENCOUNTER — Telehealth (HOSPITAL_COMMUNITY): Payer: Self-pay | Admitting: Emergency Medicine

## 2022-08-10 MED ORDER — FLUCONAZOLE 150 MG PO TABS: 150.0000 mg | ORAL_TABLET | Freq: Once | ORAL | 0 refills | Status: AC

## 2022-09-01 ENCOUNTER — Ambulatory Visit (HOSPITAL_COMMUNITY)
Admission: EM | Admit: 2022-09-01 | Discharge: 2022-09-01 | Disposition: A | Discharge status: Home / Self Care | Payer: BC Managed Care – PPO | Attending: Emergency Medicine | Admitting: Emergency Medicine

## 2022-09-01 ENCOUNTER — Encounter (HOSPITAL_COMMUNITY): Payer: Self-pay

## 2022-09-01 DIAGNOSIS — Z113 Encounter for screening for infections with a predominantly sexual mode of transmission: Secondary | ICD-10-CM | POA: Insufficient documentation

## 2022-09-01 LAB — HIV ANTIBODY (ROUTINE TESTING W REFLEX): HIV Screen 4th Generation wRfx: NONREACTIVE

## 2022-09-01 LAB — POCT URINE PREGNANCY: Preg Test, Ur: NEGATIVE

## 2022-09-01 LAB — RPR: RPR Ser Ql: NONREACTIVE

## 2022-09-01 NOTE — ED Provider Notes (Signed)
[facility]    CSN: [id number] Arrival date & time: 09/01/22  0809      History   Chief Complaint Chief Complaint  Patient presents with   SEXUALLY TRANSMITTED DISEASE    HPI Crystal Walters is a 27 y.o. female.   Patient presents to clinic requesting sexually-transmitted disease screening, as she has recently had a new sexual partner.  She denies any symptoms such as vaginal discharge, vaginal itching, dysuria, fevers or flank pain.  Would like HIV and syphilis screening as well.  The history is provided by the patient and medical records.    Past Medical History:  Diagnosis Date   Chlamydia    Depression    Seizures (HCC)    childhood   Vaginal Pap smear, abnormal     Patient Active Problem List   Diagnosis Date Noted   Indication for care in labor or delivery 10/21/2018    Past Surgical History:  Procedure Laterality Date   NO PAST SURGERIES      OB History     Gravida  2   Para  2   Term  2   Preterm      AB      Living  2      SAB      IAB      Ectopic      Multiple  0   Live Births  2            Home Medications    Prior to Admission medications   Medication Sig Start Date End Date Taking? Authorizing Provider  omeprazole (PRILOSEC) 20 MG capsule Take 1 capsule (20 mg total) by mouth daily. 02/23/19 03/22/19  Georgetta Haber, NP    Family History Family History  Problem Relation Age of Onset   Hypertension Mother     Social History Social History   Tobacco Use   Smoking status: Former    Types: Cigarettes   Smokeless tobacco: Never   Tobacco comments:    Black & Milds  Vaping Use   Vaping status: Never Used  Substance Use Topics   Alcohol use: Yes    Comment: occasionally   Drug use: Yes    Types: Marijuana    Comment: occasionally     Allergies   Patient has no known allergies.   Review of Systems Review of Systems   Physical Exam Triage Vital Signs ED Triage Vitals  Encounter  Vitals Group     BP 09/01/22 0900 124/67     Systolic BP Percentile --      Diastolic BP Percentile --      Pulse Rate 09/01/22 0900 82     Resp 09/01/22 0900 16     Temp 09/01/22 0900 98.7 F (37.1 C)     Temp Source 09/01/22 0900 Oral     SpO2 09/01/22 0900 96 %     Weight --      Height --      Head Circumference --      Peak Flow --      Pain Score 09/01/22 0857 0     Pain Loc --      Pain Education --      Exclude from Growth Chart --    No data found.  Updated Vital Signs BP 124/67 (BP Location: Right Arm)   Pulse 82   Temp 98.7 F (37.1 C) (Oral)   Resp 16   LMP 08/23/2022 (Exact Date)  SpO2 96%   Visual Acuity Right Eye Distance:   Left Eye Distance:   Bilateral Distance:    Right Eye Near:   Left Eye Near:    Bilateral Near:     Physical Exam Vitals and nursing note reviewed.  Constitutional:      Appearance: Normal appearance.  HENT:     Head: Normocephalic and atraumatic.     Right Ear: External ear normal.     Left Ear: External ear normal.     Nose: Nose normal.     Mouth/Throat:     Mouth: Mucous membranes are moist.  Eyes:     Conjunctiva/sclera: Conjunctivae normal.  Cardiovascular:     Rate and Rhythm: Normal rate.  Pulmonary:     Effort: Pulmonary effort is normal. No respiratory distress.  Musculoskeletal:        General: Normal range of motion.  Skin:    General: Skin is warm and dry.  Neurological:     General: No focal deficit present.     Mental Status: She is alert and oriented to person, place, and time.  Psychiatric:        Mood and Affect: Mood normal.        Behavior: Behavior normal.      UC Treatments / Results  Labs (all labs ordered are listed, but only abnormal results are displayed) Labs Reviewed  RPR  HIV ANTIBODY (ROUTINE TESTING W REFLEX)  POCT URINE PREGNANCY  CERVICOVAGINAL ANCILLARY ONLY    EKG   Radiology No results found.  Procedures Procedures (including critical care  time)  Medications Ordered in UC Medications - No data to display  Initial Impression / Assessment and Plan / UC Course  I have reviewed the triage vital signs and the nursing notes.  Pertinent labs & imaging results that were available during my care of the patient were reviewed by me and considered in my medical decision making (see chart for details).  Vitals in triage reviewed, patient is hemodynamically stable.  Requesting STI screening, asymptomatic.  Cytology swab and labs obtained.  Staff to contact if anything is positive, advised to abstain from intercourse until all results are received.  Denies any chance for pregnancy, recent menstrual cycle.  Plan of care, follow-up care and return precautions given, no questions at this time.     Final Clinical Impressions(s) / UC Diagnoses   Final diagnoses:  Screening examination for sexually transmitted disease   Discharge Instructions   None    ED Prescriptions   None    PDMP not reviewed this encounter.   Lamees Gable, [place] N, [place] 09/01/22 0930

## 2022-09-01 NOTE — ED Triage Notes (Signed)
Pt states has a new partner and wants routine STD testing with blood work. Denies sx's.

## 2022-09-02 ENCOUNTER — Telehealth: Payer: Self-pay

## 2022-09-02 MED ORDER — FLUCONAZOLE 150 MG PO TABS: 150.0000 mg | ORAL_TABLET | Freq: Once | ORAL | 0 refills | Status: AC

## 2022-09-02 NOTE — Telephone Encounter (Signed)
Per protocol pt requires tx with Diflucan. Attempted to reach patient x1. LVM. Rx sent to pharmacy on file.

## 2022-09-16 ENCOUNTER — Ambulatory Visit (HOSPITAL_COMMUNITY)
Admission: EM | Admit: 2022-09-16 | Discharge: 2022-09-16 | Disposition: A | Discharge status: Home / Self Care | Payer: Medicaid Other | Attending: Internal Medicine | Admitting: Internal Medicine

## 2022-09-16 ENCOUNTER — Encounter (HOSPITAL_COMMUNITY): Payer: Self-pay

## 2022-09-16 DIAGNOSIS — Z9189 Other specified personal risk factors, not elsewhere classified: Secondary | ICD-10-CM | POA: Diagnosis present

## 2022-09-16 DIAGNOSIS — N760 Acute vaginitis: Secondary | ICD-10-CM

## 2022-09-16 LAB — HIV ANTIBODY (ROUTINE TESTING W REFLEX): HIV Screen 4th Generation wRfx: NONREACTIVE

## 2022-09-16 MED ORDER — METRONIDAZOLE 500 MG PO TABS: 500.0000 mg | ORAL_TABLET | Freq: Two times a day (BID) | ORAL | 0 refills | Status: DC

## 2022-09-16 NOTE — ED Triage Notes (Signed)
Patient here today to be tested for STDs. She states that she started having vaginal discharge after having sex 2 days ago. Patient states that she did have a new partner.

## 2022-09-16 NOTE — Discharge Instructions (Addendum)
Your evaluation suggests that you likely have bacterial vaginitis.  Your vaginal testing is pending and will come back in the next 2-3 days. You will receive a phone call from staff if you test positive for any other infections. We will go ahead and start treatment for BV. Take antibiotic as prescribed to treat this. If you were given flagyl pills, do not drink alcohol during or for 48 hours after finishing antibiotic course.  Center for [facility]            [address], [place], [place] [address] [phone]   Center for [facility] at [facility]                                                            [address], [place], [place], [address] [phone]  Follow-up with OB/GYN or return to urgent care as needed.  If you develop any new or worsening symptoms or if your symptoms do not start to improve, please return here or follow-up with your primary care provider. If your symptoms are severe, please go to the emergency room.

## 2022-09-16 NOTE — ED Provider Notes (Signed)
[facility]    CSN: [id number] Arrival date & time: 09/16/22  1556      History   Chief Complaint Chief Complaint  Patient presents with   SEXUALLY TRANSMITTED DISEASE    HPI Crystal Walters is a 27 y.o. female.   Patient presents to urgent care for evaluation of vaginal discharge that started 2 days ago after she participated in intercourse with new female partner unprotected. Vaginal discharge is white in color and without door or itch to the vaginal area. No rash, N/V/D, abdominal pain, or fever/chills. No known exposure to STD. No urinary symptoms or concern for pregnancy.  History of BV, wonders if this is the same.     Past Medical History:  Diagnosis Date   Chlamydia    Depression    Seizures (HCC)    childhood   Vaginal Pap smear, abnormal     Patient Active Problem List   Diagnosis Date Noted   Indication for care in labor or delivery 10/21/2018    Past Surgical History:  Procedure Laterality Date   NO PAST SURGERIES      OB History     Gravida  2   Para  2   Term  2   Preterm      AB      Living  2      SAB      IAB      Ectopic      Multiple  0   Live Births  2            Home Medications    Prior to Admission medications   Medication Sig Start Date End Date Taking? Authorizing Provider  metroNIDAZOLE (FLAGYL) 500 MG tablet Take 1 tablet (500 mg total) by mouth 2 (two) times daily. 09/16/22  Yes Carlisle Beers, FNP  omeprazole (PRILOSEC) 20 MG capsule Take 1 capsule (20 mg total) by mouth daily. 02/23/19 03/22/19  Georgetta Haber, NP    Family History Family History  Problem Relation Age of Onset   Hypertension Mother     Social History Social History   Tobacco Use   Smoking status: Every Day    Types: Cigars   Smokeless tobacco: Never   Tobacco comments:    Black & Milds  Vaping Use   Vaping status: Never Used  Substance Use Topics   Alcohol use: Yes    Comment: occasionally   Drug use:  Yes    Types: Marijuana    Comment: occasionally     Allergies   Patient has no known allergies.   Review of Systems Review of Systems Per HPI  Physical Exam Triage Vital Signs ED Triage Vitals  Encounter Vitals Group     BP 09/16/22 1623 113/73     Systolic BP Percentile --      Diastolic BP Percentile --      Pulse Rate 09/16/22 1623 91     Resp 09/16/22 1623 16     Temp 09/16/22 1623 98.7 F (37.1 C)     Temp Source 09/16/22 1623 Oral     SpO2 09/16/22 1623 100 %     Weight 09/16/22 1621 139 lb (63 kg)     Height 09/16/22 1621 5\' 7"  (1.702 m)     Head Circumference --      Peak Flow --      Pain Score 09/16/22 1621 0     Pain Loc --      Pain  Education --      Exclude from [employer] Chart --    No data found.  Updated Vital Signs BP 113/73 (BP Location: Right Arm)   Pulse 91   Temp 98.7 F (37.1 C) (Oral)   Resp 16   Ht 5\' 7"  (1.702 m)   Wt 139 lb (63 kg)   LMP 08/23/2022 (Exact Date)   SpO2 100%   BMI 21.77 kg/m   Visual Acuity Right Eye Distance:   Left Eye Distance:   Bilateral Distance:    Right Eye Near:   Left Eye Near:    Bilateral Near:     Physical Exam Vitals and nursing note reviewed.  Constitutional:      Appearance: She is not ill-appearing or toxic-appearing.  HENT:     Head: Normocephalic and atraumatic.     Right Ear: Hearing and external ear normal.     Left Ear: Hearing and external ear normal.     Nose: Nose normal.     Mouth/Throat:     Lips: Pink.  Eyes:     General: Lids are normal. Vision grossly intact. Gaze aligned appropriately.     Extraocular Movements: Extraocular movements intact.     Conjunctiva/sclera: Conjunctivae normal.  Pulmonary:     Effort: Pulmonary effort is normal.  Genitourinary:    Comments: Deferred. Musculoskeletal:     Cervical back: Neck supple.  Skin:    General: Skin is warm and dry.     Capillary Refill: Capillary refill takes less than 2 seconds.     Findings: No rash.   Neurological:     General: No focal deficit present.     Mental Status: She is alert and oriented to person, place, and time. Mental status is at baseline.     Cranial Nerves: No dysarthria or facial asymmetry.  Psychiatric:        Mood and Affect: Mood normal.        Speech: Speech normal.        Behavior: Behavior normal.        Thought Content: Thought content normal.        Judgment: Judgment normal.      UC Treatments / Results  Labs (all labs ordered are listed, but only abnormal results are displayed) Labs Reviewed  HIV ANTIBODY (ROUTINE TESTING W REFLEX)  RPR  CERVICOVAGINAL ANCILLARY ONLY    EKG   Radiology No results found.  Procedures Procedures (including critical care time)  Medications Ordered in UC Medications - No data to display  Initial Impression / Assessment and Plan / UC Course  I have reviewed the triage vital signs and the nursing notes.  Pertinent labs & imaging results that were available during my care of the patient were reviewed by me and considered in my medical decision making (see chart for details).   1.  Acute vaginitis, at risk for sexually transmitted disease due to unprotected sex High clinical suspicion for bacterial vaginosis, therefore will treat with flagyl empirically.  Discussed risks of disulfiram reaction, advised cessation of alcohol use during and for 72 hours after use of flagyl.  Vaginal swab pending, will treat for other positive infections per protocol when labs result. Discussed tips to prevent recurrent BV infections. Safe sex precautions discussed.   Counseled patient on potential for adverse effects with medications prescribed/recommended today, strict ER and return-to-clinic precautions discussed, patient verbalized understanding.    Final Clinical Impressions(s) / UC Diagnoses   Final diagnoses:  Acute vaginitis  At risk for sexually transmitted disease due to unprotected sex     Discharge Instructions       Your evaluation suggests that you likely have bacterial vaginitis.  Your vaginal testing is pending and will come back in the next 2-3 days. You will receive a phone call from staff if you test positive for any other infections. We will go ahead and start treatment for BV. Take antibiotic as prescribed to treat this. If you were given flagyl pills, do not drink alcohol during or for 48 hours after finishing antibiotic course.  Center for [facility]            [address], [place], [place] [address] [phone]   Center for [facility]                                                            [address], [address], [place], [place], [address] [phone]  Follow-up with OB/GYN or return to urgent care as needed.  If you develop any new or worsening symptoms or if your symptoms do not start to improve, please return here or follow-up with your primary care provider. If your symptoms are severe, please go to the emergency room.      ED Prescriptions     Medication Sig Dispense Auth. Provider   metroNIDAZOLE (FLAGYL) 500 MG tablet Take 1 tablet (500 mg total) by mouth 2 (two) times daily. 14 tablet Carlisle Beers, FNP      PDMP not reviewed this encounter.   Carlisle Beers, [place] 09/16/22 1722

## 2022-09-17 LAB — RPR: RPR Ser Ql: NONREACTIVE

## 2022-09-17 LAB — CERVICOVAGINAL ANCILLARY ONLY
Bacterial Vaginitis (gardnerella): POSITIVE — AB
Candida Glabrata: POSITIVE — AB
Candida Vaginitis: NEGATIVE
Chlamydia: NEGATIVE
Comment: NEGATIVE
Comment: NEGATIVE
Comment: NEGATIVE
Comment: NEGATIVE
Comment: NEGATIVE
Comment: NORMAL
Neisseria Gonorrhea: NEGATIVE
Trichomonas: NEGATIVE

## 2022-09-20 ENCOUNTER — Telehealth: Payer: Self-pay | Admitting: Emergency Medicine

## 2022-09-20 DIAGNOSIS — N760 Acute vaginitis: Secondary | ICD-10-CM

## 2022-09-20 NOTE — Telephone Encounter (Signed)
Patient contacted by phone and advised of results of vaginal cytology.  Patient advised to complete metronidazole and for return for repeat testing if still symptomatic due to Candida glabrata being present on cytology.

## 2022-09-30 ENCOUNTER — Encounter (HOSPITAL_COMMUNITY): Payer: Self-pay | Admitting: *Deleted

## 2022-09-30 ENCOUNTER — Ambulatory Visit (HOSPITAL_COMMUNITY)
Admission: EM | Admit: 2022-09-30 | Discharge: 2022-09-30 | Disposition: A | Discharge status: Home / Self Care | Payer: Medicaid Other | Attending: Nurse Practitioner | Admitting: Nurse Practitioner

## 2022-09-30 DIAGNOSIS — N898 Other specified noninflammatory disorders of vagina: Secondary | ICD-10-CM | POA: Insufficient documentation

## 2022-09-30 MED ORDER — FLUCONAZOLE 150 MG PO TABS: 150.0000 mg | ORAL_TABLET | Freq: Once | ORAL | 0 refills | Status: AC

## 2022-09-30 NOTE — ED Triage Notes (Signed)
Pt states she tested positive for yeast at last office visit but on phone encounter advised to come back and reswab if still having sx.   She did complete BV treatment and she is still having vaginal discharge

## 2022-09-30 NOTE — ED Provider Notes (Signed)
[facility]    CSN: [id number] Arrival date & time: 09/30/22  1111      History   Chief Complaint Chief Complaint  Patient presents with   Vaginal Discharge    HPI Crystal Walters is a 27 y.o. female.   Patient presents today for ongoing vaginal discharge.  Reports she was seen on 09/16/2022, tested positive for BV and yeast infection.  Reports she was prescribed metronidazole only and took the entire thing as prescribed.  She reports the vaginal odor improved.  However reports she still has a white discharge that is sometimes thick and clumpy, sometimes thin.  She endorses a little bit of itching, however no extreme itching or vaginal sores, rashes, or lesions.  No burning with urination, increased urinary frequency or urgency, abdominal pain, nausea/vomiting, or pelvic pain.  No fevers.  No known new exposures to STI, reports she has been sexually active 1 time since she was treated but used protection.    Past Medical History:  Diagnosis Date   Chlamydia    Depression    Seizures (HCC)    childhood   Vaginal Pap smear, abnormal     Patient Active Problem List   Diagnosis Date Noted   Indication for care in labor or delivery 10/21/2018    Past Surgical History:  Procedure Laterality Date   NO PAST SURGERIES      OB History     Gravida  2   Para  2   Term  2   Preterm      AB      Living  2      SAB      IAB      Ectopic      Multiple  0   Live Births  2            Home Medications    Prior to Admission medications   Medication Sig Start Date End Date Taking? Authorizing Provider  fluconazole (DIFLUCAN) 150 MG tablet Take 1 tablet (150 mg total) by mouth once for 1 dose. 09/30/22 09/30/22 Yes Valentino Nose, NP  omeprazole (PRILOSEC) 20 MG capsule Take 1 capsule (20 mg total) by mouth daily. 02/23/19 03/22/19  Georgetta Haber, NP    Family History Family History  Problem Relation Age of Onset   Hypertension Mother      Social History Social History   Tobacco Use   Smoking status: Every Day    Types: Cigars   Smokeless tobacco: Never   Tobacco comments:    Black & Milds  Vaping Use   Vaping status: Never Used  Substance Use Topics   Alcohol use: Yes    Comment: occasionally   Drug use: Yes    Types: Marijuana    Comment: occasionally     Allergies   Patient has no known allergies.   Review of Systems Review of Systems Per HPI  Physical Exam Triage Vital Signs ED Triage Vitals  Encounter Vitals Group     BP 09/30/22 1131 118/79     Systolic BP Percentile --      Diastolic BP Percentile --      Pulse Rate 09/30/22 1131 85     Resp 09/30/22 1131 16     Temp 09/30/22 1131 98.4 F (36.9 C)     Temp Source 09/30/22 1131 Oral     SpO2 09/30/22 1131 99 %     Weight --      Height --  Head Circumference --      Peak Flow --      Pain Score 09/30/22 1129 0     Pain Loc --      Pain Education --      Exclude from Growth Chart --    No data found.  Updated Vital Signs BP 118/79 (BP Location: Left Arm)   Pulse 85   Temp 98.4 F (36.9 C) (Oral)   Resp 16   LMP 09/16/2022 (Exact Date)   SpO2 99%   Visual Acuity Right Eye Distance:   Left Eye Distance:   Bilateral Distance:    Right Eye Near:   Left Eye Near:    Bilateral Near:     Physical Exam Vitals and nursing note reviewed.  Constitutional:      General: She is not in acute distress.    Appearance: Normal appearance. She is not toxic-appearing.  HENT:     Mouth/Throat:     Mouth: Mucous membranes are moist.     Pharynx: Oropharynx is clear.  Pulmonary:     Effort: Pulmonary effort is normal. No respiratory distress.  Genitourinary:    Comments: Deferred - self swab performed by patient Skin:    General: Skin is warm and dry.     Coloration: Skin is not jaundiced or pale.     Findings: No erythema.  Neurological:     Mental Status: She is alert and oriented to person, place, and time.   Psychiatric:        Mood and Affect: Mood normal.        Behavior: Behavior is cooperative.      UC Treatments / Results  Labs (all labs ordered are listed, but only abnormal results are displayed) Labs Reviewed  CERVICOVAGINAL ANCILLARY ONLY    EKG   Radiology No results found.  Procedures Procedures (including critical care time)  Medications Ordered in UC Medications - No data to display  Initial Impression / Assessment and Plan / UC Course  I have reviewed the triage vital signs and the nursing notes.  Pertinent labs & imaging results that were available during my care of the patient were reviewed by me and considered in my medical decision making (see chart for details).   Patient is well-appearing, normotensive, afebrile, not tachycardic, not tachypneic, oxygenating well on room air.    1. Vaginal discharge Patient tested positive for candidiasis glabrata on 09/16/2022, reports no full improvement Will retest today, treat with fluconazole 150 mg once in the meantime Recommended follow-up with OB/GYN if symptoms persist and all testing negative and contact information given today  The patient was given the opportunity to ask questions.  All questions answered to their satisfaction.  The patient is in agreement to this plan.    Final Clinical Impressions(s) / UC Diagnoses   Final diagnoses:  Vaginal discharge     Discharge Instructions      Take the fluconazole as prescribed for the yeast infection.  We will contact you if the swab shows anything else.  Continue to use condoms with every sexual encounter.    ED Prescriptions     Medication Sig Dispense Auth. Provider   fluconazole (DIFLUCAN) 150 MG tablet Take 1 tablet (150 mg total) by mouth once for 1 dose. 1 tablet Valentino Nose, NP      PDMP not reviewed this encounter.   Valentino Nose, NP 09/30/22 [phone]

## 2022-09-30 NOTE — Discharge Instructions (Signed)
Take the fluconazole as prescribed for the yeast infection.  We will contact you if the swab shows anything else.  Continue to use condoms with every sexual encounter.

## 2022-10-01 LAB — CERVICOVAGINAL ANCILLARY ONLY
Bacterial Vaginitis (gardnerella): NEGATIVE
Chlamydia: NEGATIVE
Comment: NEGATIVE
Comment: NEGATIVE
Comment: NEGATIVE
Comment: NORMAL
Neisseria Gonorrhea: NEGATIVE
Trichomonas: NEGATIVE

## 2022-10-21 ENCOUNTER — Ambulatory Visit (HOSPITAL_COMMUNITY)
Admission: EM | Admit: 2022-10-21 | Discharge: 2022-10-21 | Disposition: A | Discharge status: Home / Self Care | Payer: Medicaid Other | Attending: Family Medicine | Admitting: Family Medicine

## 2022-10-21 ENCOUNTER — Encounter (HOSPITAL_COMMUNITY): Payer: Self-pay | Admitting: Emergency Medicine

## 2022-10-21 DIAGNOSIS — N760 Acute vaginitis: Secondary | ICD-10-CM | POA: Insufficient documentation

## 2022-10-21 DIAGNOSIS — F5089 Other specified eating disorder: Secondary | ICD-10-CM | POA: Insufficient documentation

## 2022-10-21 LAB — POCT URINALYSIS DIP (MANUAL ENTRY)
Bilirubin, UA: NEGATIVE
Glucose, UA: NEGATIVE mg/dL
Ketones, POC UA: NEGATIVE mg/dL
Nitrite, UA: NEGATIVE
Protein Ur, POC: NEGATIVE mg/dL
Spec Grav, UA: 1.025 (ref 1.010–1.025)
Urobilinogen, UA: 0.2 E.U./dL
pH, UA: 6.5 (ref 5.0–8.0)

## 2022-10-21 LAB — POCT URINE PREGNANCY: Preg Test, Ur: NEGATIVE

## 2022-10-21 MED ORDER — FLUCONAZOLE 150 MG PO TABS: 150.0000 mg | ORAL_TABLET | ORAL | 0 refills | Status: DC | PRN

## 2022-10-21 MED ORDER — METRONIDAZOLE 500 MG PO TABS: 500.0000 mg | ORAL_TABLET | Freq: Two times a day (BID) | ORAL | 0 refills | Status: DC

## 2022-10-21 NOTE — ED Provider Notes (Signed)
[facility]    CSN: [id number] Arrival date & time: 10/21/22  1524      History   Chief Complaint Chief Complaint  Patient presents with   Vaginal Itching   Vaginal Discharge    HPI Crystal Walters is a 27 y.o. female.  Patient here today concerned for possible vaginal smell and vaginal itching x 3 days.  Patient reports that she has had a history of recurrent BV although her last test was negative and she was concerned that she has BV again.  Denies any flank pain or abdominal pain.  Endorses some vaginal irritation when urinating roughly concern regarding odor of vaginal discharge. She also mentioned that she continues to have symptoms of pica related to eating clay.  She reports she developed was having when she was pregnant and has not been able to stop since she delivered her son 3 years ago.  She denies any stressors or anxiety that may be leading to her eating clinically she endorses that she just likes the way it tastes.  Offered mental health services in the event she wanted to speak with someone regarding this habit.  Patient declines at this time.   Past Medical History:  Diagnosis Date   Chlamydia    Depression    Seizures (HCC)    childhood   Vaginal Pap smear, abnormal     Patient Active Problem List   Diagnosis Date Noted   Indication for care in labor or delivery 10/21/2018    Past Surgical History:  Procedure Laterality Date   NO PAST SURGERIES      OB History     Gravida  2   Para  2   Term  2   Preterm      AB      Living  2      SAB      IAB      Ectopic      Multiple  0   Live Births  2            Home Medications    Prior to Admission medications   Medication Sig Start Date End Date Taking? Authorizing Provider  fluconazole (DIFLUCAN) 150 MG tablet Take 1 tablet (150 mg total) by mouth every three (3) days as needed. Repeat if needed 10/21/22  Yes Bing Neighbors, NP  metroNIDAZOLE (FLAGYL) 500 MG tablet  Take 1 tablet (500 mg total) by mouth 2 (two) times daily. 10/21/22  Yes Bing Neighbors, NP  omeprazole (PRILOSEC) 20 MG capsule Take 1 capsule (20 mg total) by mouth daily. 02/23/19 03/22/19  Georgetta Haber, NP    Family History Family History  Problem Relation Age of Onset   Hypertension Mother     Social History Social History   Tobacco Use   Smoking status: Every Day    Types: Cigars   Smokeless tobacco: Never   Tobacco comments:    Black & Milds  Vaping Use   Vaping status: Never Used  Substance Use Topics   Alcohol use: Yes    Comment: occasionally   Drug use: Yes    Types: Marijuana    Comment: occasionally     Allergies   Patient has no known allergies.   Review of Systems Review of Systems  Genitourinary:  Positive for vaginal discharge.     Physical Exam Triage Vital Signs ED Triage Vitals  Encounter Vitals Group     BP 10/21/22 1542 112/75  Systolic BP Percentile --      Diastolic BP Percentile --      Pulse Rate 10/21/22 1542 90     Resp 10/21/22 1542 17     Temp 10/21/22 1542 99.5 F (37.5 C)     Temp Source 10/21/22 1542 Oral     SpO2 10/21/22 1542 96 %     Weight --      Height --      Head Circumference --      Peak Flow --      Pain Score 10/21/22 1541 5     Pain Loc --      Pain Education --      Exclude from Growth Chart --    No data found.  Updated Vital Signs BP 112/75 (BP Location: Left Arm)   Pulse 90   Temp 99.5 F (37.5 C) (Oral)   Resp 17   LMP 10/14/2022 (Exact Date)   SpO2 96%   Visual Acuity Right Eye Distance:   Left Eye Distance:   Bilateral Distance:    Right Eye Near:   Left Eye Near:    Bilateral Near:     Physical Exam Vitals reviewed.  Constitutional:      Appearance: Normal appearance.  HENT:     Head: Normocephalic and atraumatic.  Eyes:     Extraocular Movements: Extraocular movements intact.     Conjunctiva/sclera: Conjunctivae normal.     Pupils: Pupils are equal, round, and  reactive to light.  Cardiovascular:     Rate and Rhythm: Normal rate and regular rhythm.  Pulmonary:     Effort: Pulmonary effort is normal.     Breath sounds: Normal breath sounds.  Skin:    General: Skin is warm and dry.  Neurological:     General: No focal deficit present.     Mental Status: She is alert.  Psychiatric:        Mood and Affect: Mood normal.        Behavior: Behavior normal.   Patient self collected vaginal cytology UC Treatments / Results  Labs (all labs ordered are listed, but only abnormal results are displayed) Labs Reviewed  POCT URINALYSIS DIP (MANUAL ENTRY) - Abnormal; Notable for the following components:      Result Value   Blood, UA trace-intact (*)    Leukocytes, UA Large (3+) (*)    All other components within normal limits  URINE CULTURE  POCT URINE PREGNANCY  CERVICOVAGINAL ANCILLARY ONLY    EKG   Radiology No results found.  Procedures Procedures (including critical care time)  Medications Ordered in UC Medications - No data to display  Initial Impression / Assessment and Plan / UC Course  I have reviewed the triage vital signs and the nursing notes.  Pertinent labs & imaging results that were available during my care of the patient were reviewed by me and considered in my medical decision making (see chart for details).    Cover presumptively for BV with metronidazole and prescribed a prophylaxis dose of Diflucan in the event of yeast secondary to metronidazole treatment.  Asked that the remainder results will be available within 24 hours.  If any additional treatment is warranted we will notify her by phone.  UA significant for large leuks, urine culture pending as patient is complaining of vaginal irritation which could be secondary to a UTI, despite no overt dysuria. Patient verbalized understanding and agreement with plan Final Clinical Impressions(s) / UC Diagnoses   Final  diagnoses:  Vulvovaginitis  Pica in adults      Discharge Instructions      Your lab results will be available within 24 hours.  If any additional treatment is warranted we will notify you via MyChart.  Treating the day for possible BV complete medication as prescribed. I have also included mental health resources if you desire to seek treatment for PICA eating behaviors.      ED Prescriptions     Medication Sig Dispense Auth. Provider   metroNIDAZOLE (FLAGYL) 500 MG tablet Take 1 tablet (500 mg total) by mouth 2 (two) times daily. 14 tablet Bing Neighbors, NP   fluconazole (DIFLUCAN) 150 MG tablet Take 1 tablet (150 mg total) by mouth every three (3) days as needed. Repeat if needed 2 tablet Bing Neighbors, NP      PDMP not reviewed this encounter.   Bing Neighbors, NP 10/21/22 [phone]

## 2022-10-21 NOTE — ED Triage Notes (Signed)
She is having vaginal smell and some itchiness that she notice 3days ago. She also states she has Pica and has been eating more clay recently.

## 2022-10-21 NOTE — Discharge Instructions (Addendum)
Your lab results will be available within 24 hours.  If any additional treatment is warranted we will notify you via MyChart.  Treating the day for possible BV complete medication as prescribed. I have also included mental health resources if you desire to seek treatment for PICA eating behaviors.

## 2022-10-23 LAB — URINE CULTURE: Special Requests: NORMAL

## 2022-10-25 LAB — CERVICOVAGINAL ANCILLARY ONLY
Bacterial Vaginitis (gardnerella): POSITIVE — AB
Candida Glabrata: NEGATIVE
Candida Vaginitis: NEGATIVE
Chlamydia: NEGATIVE
Comment: NEGATIVE
Comment: NEGATIVE
Comment: NEGATIVE
Comment: NEGATIVE
Comment: NEGATIVE
Comment: NORMAL
Neisseria Gonorrhea: NEGATIVE
Trichomonas: NEGATIVE

## 2022-11-23 ENCOUNTER — Telehealth: Payer: Self-pay | Admitting: Genetic Counselor

## 2022-11-23 NOTE — Telephone Encounter (Signed)
Patient called in to self referral for genetic counseling. Patient does have a family history of cancer. Patient is aware of the appointment time and date as well as the address. Patient was informed to arrive 10-15 minutes prior with updated insurance information.  All questions were answered.

## 2022-11-29 ENCOUNTER — Ambulatory Visit (HOSPITAL_COMMUNITY)
Admission: EM | Admit: 2022-11-29 | Discharge: 2022-11-29 | Disposition: A | Discharge status: Home / Self Care | Payer: BC Managed Care – PPO | Attending: Emergency Medicine | Admitting: Emergency Medicine

## 2022-11-29 ENCOUNTER — Telehealth: Payer: Self-pay | Admitting: Genetic Counselor

## 2022-11-29 ENCOUNTER — Encounter (HOSPITAL_COMMUNITY): Payer: Self-pay

## 2022-11-29 DIAGNOSIS — J029 Acute pharyngitis, unspecified: Secondary | ICD-10-CM | POA: Diagnosis not present

## 2022-11-29 DIAGNOSIS — R599 Enlarged lymph nodes, unspecified: Secondary | ICD-10-CM | POA: Insufficient documentation

## 2022-11-29 DIAGNOSIS — F17290 Nicotine dependence, other tobacco product, uncomplicated: Secondary | ICD-10-CM | POA: Diagnosis not present

## 2022-11-29 NOTE — Discharge Instructions (Addendum)
Please cut down or stop smoking.  You can do warm saline gargles, tea with honey and sleeping with a humidifier to help with your sore throat.  We have obtained a swab today and we will contact you if anything results is abnormal.  Please keep your scheduled appointment with the oncologist.  Return to clinic for any new or urgent symptoms.

## 2022-11-29 NOTE — ED Triage Notes (Signed)
Patient here today with c/o swollen/sore throat that has been going on for months. The swelling and soreness comes and goes. Patient states that it only occurs whenever she smokes. Denies fever. She has a cancer scheduled for 12/21/2022

## 2022-11-29 NOTE — Telephone Encounter (Signed)
Rescheduled appointment per patients request. Patient is aware of the changes made to her upcoming appointments.

## 2022-11-29 NOTE — ED Provider Notes (Signed)
[facility]    CSN: [id number] Arrival date & time: 11/29/22  0813      History   Chief Complaint Chief Complaint  Patient presents with   Oral Swelling    HPI Crystal Walters is a 27 y.o. female.   Patient presents to clinic complaining of an intermittent sore throat that has been ongoing for the past few months.  She also reports lymph node swelling.  Reports her sore throat only occurs when she smokes.  She last smoked prior to arrival at the urgent care.  She has not had any fevers, denies any cough, shortness of breath or wheezing.  Denies any sleep loss, weight loss or appetite changes.  She is concerned about cancer as she has a family history of lung and breast cancer.  She has an appointment with a cancer doctor that is upcoming, reports she is meeting with a genetic doctor and getting some labs drawn.  Endorses that she has not been sexually active for a little while.  Denies any fevers.  No white patches or discharge coming from the back of her throat.  She has not tried any interventions for her sore throat.  The history is provided by the patient and medical records.    Past Medical History:  Diagnosis Date   Chlamydia    Depression    Seizures (HCC)    childhood   Vaginal Pap smear, abnormal     Patient Active Problem List   Diagnosis Date Noted   Indication for care in labor or delivery 10/21/2018    Past Surgical History:  Procedure Laterality Date   NO PAST SURGERIES      OB History     Gravida  2   Para  2   Term  2   Preterm      AB      Living  2      SAB      IAB      Ectopic      Multiple  0   Live Births  2            Home Medications    Prior to Admission medications   Medication Sig Start Date End Date Taking? Authorizing Provider  fluconazole (DIFLUCAN) 150 MG tablet Take 1 tablet (150 mg total) by mouth every three (3) days as needed. Repeat if needed 10/21/22   Bing Neighbors, NP   metroNIDAZOLE (FLAGYL) 500 MG tablet Take 1 tablet (500 mg total) by mouth 2 (two) times daily. 10/21/22   Bing Neighbors, NP  omeprazole (PRILOSEC) 20 MG capsule Take 1 capsule (20 mg total) by mouth daily. 02/23/19 03/22/19  Georgetta Haber, NP    Family History Family History  Problem Relation Age of Onset   Hypertension Mother     Social History Social History   Tobacco Use   Smoking status: Every Day    Types: Cigars   Smokeless tobacco: Never   Tobacco comments:    Black & Milds  Vaping Use   Vaping status: Never Used  Substance Use Topics   Alcohol use: Yes    Comment: occasionally   Drug use: Yes    Types: Marijuana    Comment: occasionally     Allergies   Patient has no known allergies.   Review of Systems Review of Systems  Per HPI   Physical Exam Triage Vital Signs ED Triage Vitals  Encounter Vitals Group     BP  11/29/22 0838 114/80     Systolic BP Percentile --      Diastolic BP Percentile --      Pulse Rate 11/29/22 0838 94     Resp 11/29/22 0838 16     Temp 11/29/22 0838 98.1 F (36.7 C)     Temp Source 11/29/22 0838 Oral     SpO2 11/29/22 0838 99 %     Weight 11/29/22 0838 138 lb (62.6 kg)     Height 11/29/22 0838 5\' 7"  (1.702 m)     Head Circumference --      Peak Flow --      Pain Score 11/29/22 0837 0     Pain Loc --      Pain Education --      Exclude from Growth Chart --    No data found.  Updated Vital Signs BP 114/80 (BP Location: Left Arm)   Pulse 94   Temp 98.1 F (36.7 C) (Oral)   Resp 16   Ht 5\' 7"  (1.702 m)   Wt 138 lb (62.6 kg)   LMP 11/28/2022 (Exact Date)   SpO2 99%   BMI 21.61 kg/m   Visual Acuity Right Eye Distance:   Left Eye Distance:   Bilateral Distance:    Right Eye Near:   Left Eye Near:    Bilateral Near:     Physical Exam Vitals and nursing note reviewed.  Constitutional:      Appearance: Normal appearance.  HENT:     Head: Normocephalic and atraumatic.     Right Ear: External ear  normal.     Left Ear: External ear normal.     Nose: Nose normal.     Mouth/Throat:     Mouth: Mucous membranes are moist.     Pharynx: Posterior oropharyngeal erythema present.  Eyes:     Conjunctiva/sclera: Conjunctivae normal.  Cardiovascular:     Rate and Rhythm: Normal rate.  Pulmonary:     Effort: Pulmonary effort is normal. No respiratory distress.  Musculoskeletal:        General: Normal range of motion.  Lymphadenopathy:     Cervical: Cervical adenopathy present.  Skin:    General: Skin is warm and dry.  Neurological:     General: No focal deficit present.     Mental Status: She is alert.  Psychiatric:        Mood and Affect: Mood normal.      UC Treatments / Results  Labs (all labs ordered are listed, but only abnormal results are displayed) Labs Reviewed  CYTOLOGY, (ORAL, ANAL, URETHRAL) ANCILLARY ONLY    EKG   Radiology No results found.  Procedures Procedures (including critical care time)  Medications Ordered in UC Medications - No data to display  Initial Impression / Assessment and Plan / UC Course  I have reviewed the triage vital signs and the nursing notes.  Pertinent labs & imaging results that were available during my care of the patient were reviewed by me and considered in my medical decision making (see chart for details).  Vitals and triage reviewed, patient is hemodynamically stable.  Posterior pharynx with erythema and positive cervical LAD.  No obvious discharge, uvula midline.  No fevers.  Strep testing deferred.  Patient is high risk for STIs, with recurrent history.  Will obtain oral cytology swab.  Encouraged to cut down or stop smoking.  Plan of care, follow-up care and return precautions given, no questions at this time.  Final Clinical Impressions(s) / UC Diagnoses   Final diagnoses:  Pharyngitis, unspecified etiology     Discharge Instructions      Please cut down or stop smoking.  You can do warm saline gargles,  tea with honey and sleeping with a humidifier to help with your sore throat.  We have obtained a swab today and we will contact you if anything results is abnormal.  Please keep your scheduled appointment with the oncologist.  Return to clinic for any new or urgent symptoms.     ED Prescriptions   None    PDMP not reviewed this encounter.   Rinaldo Ratel [place], [place] 11/29/22 [phone]

## 2022-11-30 ENCOUNTER — Inpatient Hospital Stay: Payer: BC Managed Care – PPO | Attending: Genetic Counselor | Admitting: Genetic Counselor

## 2022-11-30 ENCOUNTER — Inpatient Hospital Stay: Payer: BC Managed Care – PPO

## 2022-11-30 ENCOUNTER — Other Ambulatory Visit: Payer: Self-pay | Admitting: Genetic Counselor

## 2022-11-30 DIAGNOSIS — Z808 Family history of malignant neoplasm of other organs or systems: Secondary | ICD-10-CM

## 2022-11-30 DIAGNOSIS — Z800 Family history of malignant neoplasm of digestive organs: Secondary | ICD-10-CM

## 2022-11-30 LAB — GENETIC SCREENING ORDER

## 2022-11-30 LAB — CYTOLOGY, (ORAL, ANAL, URETHRAL) ANCILLARY ONLY
Chlamydia: NEGATIVE
Comment: NEGATIVE
Comment: NORMAL
Neisseria Gonorrhea: NEGATIVE

## 2022-11-30 NOTE — Progress Notes (Unsigned)
REFERRING PROVIDER: Self-referred  PRIMARY PROVIDER:  None  PRIMARY REASON FOR VISIT:  Encounter Diagnoses  Name Primary?   Family history of pancreatic cancer Yes   Family history of thyroid cancer     HISTORY OF PRESENT ILLNESS:   Crystal Walters, a 27 y.o. female, was seen for a [facility] cancer genetics consultation due to a family history of pancreatic and other cancers.  Crystal Walters presents to clinic today to discuss the possibility of a hereditary predisposition to cancer, to discuss genetic testing, and to further clarify her future cancer risks, as well as potential cancer risks for family members.   Crystal Walters is a 27 y.o. female with no personal history of cancer.  She reports swollen lymph nodes in her neck, back pain, and breast discomfort.  She said she is concerned about an underlying malignancy.  She reportedly was seen in Urgent Care and said she was told to establish care with a PCP for appropriate workup of these symptoms.  She has not yet established care with a PCP.   CANCER HISTORY:  Oncology History   No history exists.    RISK FACTORS:  Mammogram within the last year: no Colonoscopy: no; not examined. Hysterectomy: no.  Ovaries intact: yes.  Menarche was at age 39.  First live birth at age 59.  OCP use for approximately 0 years.  HRT use: 0 years.   FAMILY HISTORY:  We obtained a detailed, 4-generation family history.  Significant diagnoses are listed below: Family History  Problem Relation Age of Onset   Cervical cancer Paternal Grandmother    Thyroid cancer Paternal Grandmother        medullary; dx >50   Cancer Half-Sister        "ectodermal"; dx 6 months; paternal half isster   Pancreatic cancer Other        PGF's brother (dx 30s) and PGF's mother (dx 29s)   Ovarian cancer Other        PGF's mother (d. 41)   Colon cancer Other        unkown maternal relative          Crystal Walters's had limited information about cancer history in  several relatives.  Her paternal grandmother thinks she had negative genetic testing when she was diagnosed with medullary thyroid cancer.   Other relatives are unavailable for genetic testing at this time.    There is no reported Ashkenazi Jewish ancestry. There is no known consanguinity.  GENETIC COUNSELING ASSESSMENT: Crystal Walters is a 27 y.o. female with a family history of cancer which is somewhat suggestive of a hereditary cancer syndrome and predisposition to cancer in more distant relatives given the presence of rare cancers (medullary thyroid cancer) and related cancers (multiple relatives with pancreatic cancer) in the family. We, therefore, discussed and recommended the following at today's visit.   DISCUSSION:   We discussed that, in general, most cancer is not inherited in families, but instead is sporadic or familial. Sporadic cancers occur by chance and typically happen at older ages (>50 years) as this type of cancer is caused by genetic changes acquired during an individual's lifetime. Some families have more cancers than would be expected by chance; however, the ages or types of cancer are not consistent with a known genetic mutation or known genetic mutations have been ruled out. This type of familial cancer is thought to be due to a combination of multiple genetic, environmental, hormonal, and lifestyle factors. While this combination of factors  likely increases the risk of cancer, the exact source of this risk is not currently identifiable or testable.    We discussed that 5 - 10% of cancer is hereditary.  Most cases of hereditary pancreatic cancer are associated with mutations in BRCA1/2.  Most cases of hereditary medullary thyroid cancer are associated with mutations in the RET gene.  There are other genes that can be associated with hereditary pancreatic or other cancer syndromes.  We discussed that testing is beneficial for several reasons including knowing how to follow  individuals for their cancer risks and understanding if other family members could be at risk for cancer and allowing them to undergo genetic testing.   We reviewed the characteristics, features and inheritance patterns of hereditary cancer syndromes. We also discussed genetic testing, including the appropriate family members to test, the process of testing, insurance coverage and turn-around-time for results. We discussed the implications of a negative, positive, carrier and/or variant of uncertain significant result.  We discussed that Crystal Walters's negative results would be uninformative given she does not have a personal history of cancer.  We discussed those more closely related to the cancer in the family are more informative relatives to test, such as her paternal grandfather (whose brother and mother had pancreatic cancer) and her paternal grandmother (who had medullary thyroid cancer).   We discussed that while she does not meet NCCN criteria because she is more distantly related to the relatives with cancer in the family, it is not unreasonable to consider genetic testing given other relatives are not available for testing/previous genetic testing reports of relatives are not available.  Crystal Walters was interested in proceeding with testing. She understands there may be an out of pocket cost. We recommended Crystal Walters pursue genetic testing for a panel that includes genes associated with pancreatic, medullary thyroid, and other cancers.   The CancerNext-Expanded gene panel offered by [facility] and includes sequencing, rearrangement, and RNA analysis for the following 71 genes:  AIP, ALK, APC, ATM, BAP1, BARD1, BMPR1A, BRCA1, BRCA2, BRIP1, CDC73, CDH1, CDK4, CDKN1B, CDKN2A, CHEK2, DICER1, FH, FLCN, KIF1B, LZTR1,MAX, MEN1, MET, MLH1, MSH2, MSH6, MUTYH, NF1, NF2, NTHL1, PALB2, PHOX2B, PMS2, POT1, PRKAR1A, PTCH1, PTEN, RAD51C,RAD51D, RB1, RET, SDHA, SDHAF2, SDHB, SDHC, SDHD, SMAD4, SMARCA4,  SMARCB1, SMARCE1, STK11, SUFU, TMEM127, TP53,TSC1, TSC2 and VHL (sequencing and deletion/duplication); AXIN2, CTNNA1, EGFR, EGLN1, HOXB13, KIT, MITF, MSH3, PDGFRA, POLD1 and POLE (sequencing only); EPCAM and GREM1 (deletion/duplication only).   We discussed the Genetic Information Non-Discrimination Act (GINA) of 2008, which helps protect individuals against genetic discrimination based on their genetic test results.  It impacts both health insurance and employment.  With health insurance, it protects against genetic test results being used for increased premiums or policy termination. For employment, it protects against hiring, firing and promoting decisions based on genetic test results.  GINA does not apply to those in the [employer], those who work for companies with less than 15 employees, and new life insurance or long-term disability insurance policies.  Health status due to a cancer diagnosis is not protected under GINA.   PLAN: After considering the risks, benefits, and limitations, Crystal Walters provided informed consent to pursue genetic testing and the blood sample was sent to [facility] for analysis of the CancerNext-Expanded +RNAinsight Panel. Results should be available within approximately 2-3 weeks, at which point they will be disclosed by telephone to Crystal Walters, as will any additional recommendations warranted by these results. Crystal Walters will receive a summary of her  genetic counseling visit and a copy of her results once available. This information will also be available in Epic.   We strongly encouraged her to establish care with a PCP.  We emphasized that the results of this test will not provide any information regarding current symptoms.   Based on Crystal Walters's family history, we recommended her paternal grandfather, who has two first degree relatives with pancreatic cancer, have genetic counseling/testing.  We also recommend her paternal grandmother have genetic testing  given her history of medullary thyroid cancer if she has not had testing previously.  Crystal Walters questions were answered to her satisfaction today. Our contact information was provided should additional questions or concerns arise. Thank you for the referral and allowing [place] to share in the care of your patient.   Jolyssa Oplinger M. Rennie Plowman, MS, [facility] Genetic Counselor [email or web address] (P) [phone]  The patient was seen for a total of 40 minutes in face-to-face genetic counseling.  The patient was seen alone.  Drs. Gunnar Bulla and/or Mosetta Putt were available to discuss this case as needed.    _______________________________________________________________________ For Office Staff:  Number of people involved in session: 1 Was an Intern/ student involved with case: no

## 2022-12-01 ENCOUNTER — Encounter: Payer: Self-pay | Admitting: Genetic Counselor

## 2022-12-16 ENCOUNTER — Encounter: Payer: Self-pay | Admitting: Genetic Counselor

## 2022-12-16 ENCOUNTER — Ambulatory Visit: Payer: Self-pay | Admitting: Genetic Counselor

## 2022-12-16 ENCOUNTER — Telehealth: Payer: Self-pay | Admitting: Genetic Counselor

## 2022-12-16 DIAGNOSIS — Z808 Family history of malignant neoplasm of other organs or systems: Secondary | ICD-10-CM

## 2022-12-16 DIAGNOSIS — Z1379 Encounter for other screening for genetic and chromosomal anomalies: Secondary | ICD-10-CM | POA: Insufficient documentation

## 2022-12-16 DIAGNOSIS — Z800 Family history of malignant neoplasm of digestive organs: Secondary | ICD-10-CM

## 2022-12-16 NOTE — Progress Notes (Addendum)
HPI:   Crystal Walters was previously seen in the [facility] due to a family history of cancer and concerns regarding a hereditary predisposition to cancer.    Crystal Walters's recent genetic test results were disclosed to her by telephone. These results and recommendations are discussed in more detail below.  CANCER HISTORY:  Oncology History   No history exists.    Family History  Problem Relation Age of Onset   Cervical cancer Paternal Grandmother     Thyroid cancer Paternal Grandmother          medullary; dx >50   Cancer Half-Sister          "ectodermal"; dx 6 months; paternal half isster   Pancreatic cancer Other          PGF's brother (dx 28s) and PGF's mother (dx 31s)   Ovarian cancer Other          PGF's mother (d. 59)   Colon cancer Other          unkown maternal relative                 Crystal Walters had limited information about cancer history in several relatives.  Her paternal grandmother thinks she had negative genetic testing when she was diagnosed with medullary thyroid cancer.    Other relatives are unavailable for genetic testing at this time.      There is no reported Ashkenazi Jewish ancestry. There is no known consanguinity.  GENETIC TEST RESULTS:  The [facility] CancerNext-Expanded +RNAinsight Panel found no pathogenic mutations.   The CancerNext-Expanded gene panel offered by [facility] and includes sequencing, rearrangement, and RNA analysis for the following 71 genes:  AIP, ALK, APC, ATM, BAP1, BARD1, BMPR1A, BRCA1, BRCA2, BRIP1, CDC73, CDH1, CDK4, CDKN1B, CDKN2A, CHEK2, DICER1, FH, FLCN, KIF1B, LZTR1,MAX, MEN1, MET, MLH1, MSH2, MSH6, MUTYH, NF1, NF2, NTHL1, PALB2, PHOX2B, PMS2, POT1, PRKAR1A, PTCH1, PTEN, RAD51C,RAD51D, RB1, RET, SDHA, SDHAF2, SDHB, SDHC, SDHD, SMAD4, SMARCA4, SMARCB1, SMARCE1, STK11, SUFU, TMEM127, TP53,TSC1, TSC2 and VHL (sequencing and deletion/duplication); AXIN2, CTNNA1, EGFR, EGLN1, HOXB13, KIT, MITF, MSH3,  PDGFRA, POLD1 and POLE (sequencing only); EPCAM and GREM1 (deletion/duplication only).   The test report has been scanned into EPIC and is located under the Molecular Pathology section of the Results Review tab.  A portion of the result report is included below for reference. Genetic testing reported out on December 15, 2022.      Even though a pathogenic variant was Walters identified, possible explanations for the cancer in the family may include: There may be no hereditary risk for cancer in the family. The cancers in Crystal Walters may be sporadic/familial or due to other genetic and environmental factors.  Most cancer is Walters hereditary.  There may be a gene mutation in one of these genes that current testing methods cannot detect but that chance is small. There could be another gene that has Walters yet been discovered, or that we have Walters yet tested, that is responsible for the cancer diagnoses in the family.  It is also possible there is a hereditary cause for the cancer in the family that Crystal Walters inherit.  Therefore, it is important to remain in touch with cancer genetics in the future so that we can continue to offer Crystal Walters the most up to date genetic testing.    ADDITIONAL GENETIC TESTING:   Crystal Walters genetic testing was fairly extensive.  If there are additional relevant genes identified to  increase cancer risk that can be analyzed in the future, we would be happy to discuss and coordinate this testing at that time.     CANCER SCREENING RECOMMENDATIONS:  Crystal Walters test result is considered negative (normal).  This means that we have Walters identified a hereditary cause for her family history of cancer at this time.   An individual's cancer risk and medical management are Walters determined by genetic test results alone. Overall cancer risk assessment incorporates additional factors, including personal medical history, family history, and any available genetic  information that may result in a personalized plan for cancer prevention and surveillance. Therefore, it is recommended she continue to follow the cancer management and screening guidelines provided by a primary healthcare provider.  She reports swollen lymph nodes in her neck, back pain, and breast discomfort. She previously mentioned that she is concerned about an underlying malignancy. She reportedly was seen in Urgent Care and said she was told to establish care with a PCP for appropriate workup of these symptoms. She has Walters yet established care with a PCP.  We strongly recommended she establish care with a PCP.  We emphasized that this genetic test is Walters able to detect underlying malignancies, and these negative results do Walters lower her risk of developing cancer, as most cancer is Walters hereditary.    RECOMMENDATIONS FOR FAMILY MEMBERS:   Since she did Walters inherit a identifiable mutation in a cancer predisposition gene included on this panel, her children could Walters have inherited a known mutation from her in one of these genes. Individuals in this family might be at some increased risk of developing cancer, over the general population risk, due to the family history of cancer.  Individuals in the family should notify their providers of the family history of cancer. We recommend women in this family have a yearly mammogram beginning at age 38, or 36 years younger than the earliest onset of cancer, an annual clinical breast exam, and perform monthly breast self-exams.  Risk models that take into account family history and hormonal history may be helpful in determining appropriate breast cancer screening options for family members. Female relatives should speak with their providers about prostate cancer screening.  Other members of the family may still carry a pathogenic variant in one of these genes that Crystal Walters did Walters inherit. Based on the family history, we recommend relatives that are more closely  related to those that have had pancreatic cancer, such as her paternal grandfather, have genetic counseling and testing. Ms. Heffington can let us know if we can be of any assistance in coordinating genetic counseling and/or testing for these family members.     FOLLOW-UP:  Cancer genetics is a rapidly advancing field and it is possible that new genetic tests will be appropriate for her and/or her family members in the future. We encourage Ms. Veles to remain in contact with cancer genetics, so we can update her personal and family histories and let her know of advances in cancer genetics that may benefit this family.   Our contact number was provided.  They are welcome to call us at anytime with additional questions or concerns.   Gurpreet Mariani M. Rennie Plowman, MS, [facility] Genetic Counselor [email or web address] (P) [phone]

## 2022-12-16 NOTE — Telephone Encounter (Signed)
Disclosed negative genetics.  Discussed that her paternal grandfather is a more informative person to testing.  Strongly recommended she establish care with a PCP so that they can evaluate signs/symptoms.

## 2022-12-21 ENCOUNTER — Encounter: Payer: Medicaid Other | Admitting: Genetic Counselor

## 2022-12-21 ENCOUNTER — Other Ambulatory Visit: Payer: Medicaid Other

## 2022-12-31 ENCOUNTER — Ambulatory Visit (INDEPENDENT_AMBULATORY_CARE_PROVIDER_SITE_OTHER): Payer: BC Managed Care – PPO

## 2022-12-31 ENCOUNTER — Encounter: Payer: Self-pay | Admitting: Family Medicine

## 2022-12-31 ENCOUNTER — Ambulatory Visit (INDEPENDENT_AMBULATORY_CARE_PROVIDER_SITE_OTHER): Payer: BC Managed Care – PPO | Admitting: Family Medicine

## 2022-12-31 VITALS — BP 96/72 | HR 94 | Temp 98.6°F | Ht 67.0 in | Wt 147.0 lb

## 2022-12-31 DIAGNOSIS — E049 Nontoxic goiter, unspecified: Secondary | ICD-10-CM

## 2022-12-31 DIAGNOSIS — F17200 Nicotine dependence, unspecified, uncomplicated: Secondary | ICD-10-CM

## 2022-12-31 DIAGNOSIS — R0982 Postnasal drip: Secondary | ICD-10-CM

## 2022-12-31 DIAGNOSIS — F1290 Cannabis use, unspecified, uncomplicated: Secondary | ICD-10-CM | POA: Diagnosis not present

## 2022-12-31 DIAGNOSIS — F5089 Other specified eating disorder: Secondary | ICD-10-CM | POA: Diagnosis not present

## 2022-12-31 DIAGNOSIS — R059 Cough, unspecified: Secondary | ICD-10-CM | POA: Diagnosis not present

## 2022-12-31 DIAGNOSIS — R0989 Other specified symptoms and signs involving the circulatory and respiratory systems: Secondary | ICD-10-CM | POA: Diagnosis not present

## 2022-12-31 DIAGNOSIS — R051 Acute cough: Secondary | ICD-10-CM

## 2022-12-31 DIAGNOSIS — F411 Generalized anxiety disorder: Secondary | ICD-10-CM | POA: Diagnosis not present

## 2022-12-31 DIAGNOSIS — Z716 Tobacco abuse counseling: Secondary | ICD-10-CM

## 2022-12-31 DIAGNOSIS — D509 Iron deficiency anemia, unspecified: Secondary | ICD-10-CM

## 2022-12-31 DIAGNOSIS — R09A2 Foreign body sensation, throat: Secondary | ICD-10-CM

## 2022-12-31 LAB — CBC WITH DIFFERENTIAL/PLATELET
Basophils Absolute: 0 10*3/uL (ref 0.0–0.1)
Basophils Relative: 1 % (ref 0.0–3.0)
Eosinophils Absolute: 0.1 10*3/uL (ref 0.0–0.7)
Eosinophils Relative: 2.3 % (ref 0.0–5.0)
HCT: 29.8 % — ABNORMAL LOW (ref 36.0–46.0)
Hemoglobin: 9.3 g/dL — ABNORMAL LOW (ref 12.0–15.0)
Lymphocytes Relative: 33.8 % (ref 12.0–46.0)
Lymphs Abs: 1.6 10*3/uL (ref 0.7–4.0)
MCHC: 31.2 g/dL (ref 30.0–36.0)
MCV: 74.7 fL — ABNORMAL LOW (ref 78.0–100.0)
Monocytes Absolute: 0.5 10*3/uL (ref 0.1–1.0)
Monocytes Relative: 10.4 % (ref 3.0–12.0)
Neutro Abs: 2.5 10*3/uL (ref 1.4–7.7)
Neutrophils Relative %: 52.5 % (ref 43.0–77.0)
Platelets: 405 10*3/uL — ABNORMAL HIGH (ref 150.0–400.0)
RBC: 3.99 Mil/uL (ref 3.87–5.11)
RDW: 18.7 % — ABNORMAL HIGH (ref 11.5–15.5)
WBC: 4.8 10*3/uL (ref 4.0–10.5)

## 2022-12-31 LAB — COMPREHENSIVE METABOLIC PANEL
ALT: 13 U/L (ref 0–35)
AST: 24 U/L (ref 0–37)
Albumin: 4.5 g/dL (ref 3.5–5.2)
Alkaline Phosphatase: 66 U/L (ref 39–117)
BUN: 14 mg/dL (ref 6–23)
CO2: 29 meq/L (ref 19–32)
Calcium: 8.8 mg/dL (ref 8.4–10.5)
Chloride: 101 meq/L (ref 96–112)
Creatinine, Ser: 0.78 mg/dL (ref 0.40–1.20)
GFR: 102.34 mL/min (ref >60.00)
Glucose, Bld: 92 mg/dL (ref 70–99)
Potassium: 4 meq/L (ref 3.5–5.1)
Sodium: 136 meq/L (ref 135–145)
Total Bilirubin: 0.4 mg/dL (ref 0.2–1.2)
Total Protein: 7.8 g/dL (ref 6.0–8.3)

## 2022-12-31 LAB — TSH: TSH: 1.1 u[IU]/mL (ref 0.35–5.50)

## 2022-12-31 MED ORDER — NICOTINE 7 MG/24HR TD PT24: 7.0000 mg | MEDICATED_PATCH | Freq: Every day | TRANSDERMAL | 3 refills | Status: DC

## 2022-12-31 MED ORDER — IRON (FERROUS SULFATE) 325 (65 FE) MG PO TABS: 325.0000 mg | ORAL_TABLET | Freq: Every day | ORAL | 1 refills | Status: AC

## 2022-12-31 NOTE — Progress Notes (Signed)
New Patient Office Visit  Subjective    Patient ID: Crystal Walters, female    DOB: 07/27/1995  Age: 27 y.o. MRN: [id number]  CC:  Chief Complaint  Patient presents with   Establish Care    Lymph nodes in neck feel swollen and she says it feels worst when she smokes.     HPI Crystal Walters presents to establish care today. 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 She is a single mother of 2 children. Reports that she is a current daily smoker of Black and milds.  She does express interest in quitting today.  Inquiring about using nicotine patches today.  States that she smokes marijuana as well.  Reports that when she smokes marijuana, she feels like her throat swells and closes up.  States that this began over the summer, states that she still sometimes smokes marijuana and will just deal with the side effects.  Reports that neck swelling is concerning for her, denies any shortness of breath, trouble swallowing, nausea, vomiting. She is concerned that it may be lymph nodes. States that she feels that this is happening inside her body and is not always visible when she looks in the mirror or to other people. She has never taken any medications to try to treat this.  She does not take any current daily medications.  Has history of anxiety and pica.  Reports that she eats dirt from a certain area, states that she craves this.  States that she does this as often as she can.  She is unable to actually quantify amount.  Denies any abdominal pain, nausea, vomiting, diarrhea, constipation, change in color of her stools as a result.  Has extensive family history of cancers as listed below.  Also reports a cough for the last week that is worse with smoking.  Has never had this evaluated.  Has not attempted OTC treatment.  Has history of anxiety, has never tried therapy or medications to treat this in the past. States that she smokes and this helps in the moment when she needs it. Denies  SI, HI.  Denies other concerns today. Medical history as outlined below.  Outpatient Encounter Medications as of 12/31/2022  Medication Sig   nicotine (NICODERM CQ - DOSED IN MG/24 HR) 7 mg/24hr patch Place 1 patch (7 mg total) onto the skin daily.   [DISCONTINUED] fluconazole (DIFLUCAN) 150 MG tablet Take 1 tablet (150 mg total) by mouth every three (3) days as needed. Repeat if needed   [DISCONTINUED] metroNIDAZOLE (FLAGYL) 500 MG tablet Take 1 tablet (500 mg total) by mouth 2 (two) times daily.   [DISCONTINUED] omeprazole (PRILOSEC) 20 MG capsule Take 1 capsule (20 mg total) by mouth daily.   No facility-administered encounter medications on file as of 12/31/2022.    Past Medical History:  Diagnosis Date   Chlamydia    Depression    Seizures (HCC)    childhood   Vaginal Pap smear, abnormal     Past Surgical History:  Procedure Laterality Date   NO PAST SURGERIES      Family History  Problem Relation Age of Onset   Hypertension Mother    Cervical cancer Paternal Grandmother    Thyroid cancer Paternal Grandmother        medullary; dx >50   Cancer Half-Sister        "ectodermal"; dx 6 months; paternal half isster   Pancreatic cancer Other        PGF's brother (dx  40s) and PGF's mother (dx 45s)   Ovarian cancer Other        PGF's mother (d. 38)   Colon cancer Other        unkown maternal relative    Social History   Socioeconomic History   Marital status: Single    Spouse name: Not on file   Number of children: Not on file   Years of education: Not on file   Highest education level: Not on file  Occupational History   Not on file  Tobacco Use   Smoking status: Every Day    Types: Cigars   Smokeless tobacco: Never   Tobacco comments:    Black & Milds  Vaping Use   Vaping status: Never Used  Substance and Sexual Activity   Alcohol use: Yes    Comment: occasionally   Drug use: Yes    Types: Marijuana    Comment: occasionally   Sexual activity: Yes     Birth control/protection: Condom    Comment: occasionally  Other Topics Concern   Not on file  Social History Narrative   Not on file   Social Determinants of Health   Financial Resource Strain: Low Risk  (10/17/2018)   Overall Financial Resource Strain (CARDIA)    Difficulty of Paying Living Expenses: Not hard at all  Food Insecurity: No Food Insecurity (10/17/2018)   Hunger Vital Sign    Worried About Running Out of Food in the Last Year: Never true    Ran Out of Food in the Last Year: Never true  Transportation Needs: No Transportation Needs (10/17/2018)   PRAPARE - Administrator, Civil Service (Medical): No    Lack of Transportation (Non-Medical): No  Physical Activity: Not on file  Stress: Not on file  Social Connections: Unknown (06/09/2021)   Received from [facility], [facility]   Social Network    Social Network: Not on file  Intimate Partner Violence: Unknown (05/01/2021)   Received from [facility], [facility]   HITS    Physically Hurt: Not on file    Insult or Talk Down To: Not on file    Threaten Physical Harm: Not on file    Scream or Curse: Not on file    ROS Per HPI      Objective    BP 96/72 (BP Location: Left Arm, Patient Position: Sitting, Cuff Size: Normal)   Pulse 94   Temp 98.6 F (37 C) (Oral)   Ht 5\' 7"  (1.702 m)   Wt 147 lb (66.7 kg)   SpO2 99%   BMI 23.02 kg/m   Physical Exam Vitals and nursing note reviewed.  Constitutional:      Appearance: Normal appearance. She is normal weight.  HENT:     Head: Normocephalic and atraumatic.     Nose: Nose normal.     Mouth/Throat:     Mouth: Mucous membranes are moist.     Pharynx: Oropharynx is clear.  Eyes:     Extraocular Movements: Extraocular movements intact.     Pupils: Pupils are equal, round, and reactive to light.  Neck:     Thyroid: Thyromegaly present. No thyroid mass or thyroid tenderness.  Cardiovascular:     Rate and Rhythm: Normal rate and regular  rhythm.     Heart sounds: Normal heart sounds.  Pulmonary:     Effort: Pulmonary effort is normal.     Breath sounds: Normal breath sounds.     Comments: Cough present  Musculoskeletal:        General: Normal range of motion.     Cervical back: Normal range of motion and neck supple.  Lymphadenopathy:     Cervical: No cervical adenopathy.  Skin:    General: Skin is warm and dry.  Neurological:     General: No focal deficit present.     Mental Status: She is alert and oriented to person, place, and time.  Psychiatric:        Mood and Affect: Mood normal.        Thought Content: Thought content normal.         Assessment & Plan:   Current smoker -     Nicotine; Place 1 patch (7 mg total) onto the skin daily.  Dispense: 28 patch; Refill: 3 -     Ambulatory referral to Allergy -     DG Chest 2 View; Future  Encounter for smoking cessation counseling -     Nicotine; Place 1 patch (7 mg total) onto the skin daily.  Dispense: 28 patch; Refill: 3  GAD (generalized anxiety disorder) -     Ambulatory referral to Psychology  Pica -     Ambulatory referral to Psychology -     CBC with Differential/Platelet -     Comprehensive metabolic panel -     Iron, TIBC and Ferritin Panel  Post-nasal drip -     Ambulatory referral to Allergy  Marijuana use -     DG Chest 2 View; Future  Acute cough -     DG Chest 2 View; Future  Enlarged thyroid -     TSH -     US THYROID; Future  Globus sensation -     TSH -     US THYROID; Future   We have placed a referral for you to the allergist and to a therapist today. Someone will be reaching out to get you scheduled.  We have placed an order for a thyroid US, someone will be reaching out to get you scheduled. We will follow up with results once they are received.  I have sent in nicotine patches for you to try to help you quit smoking. May use one patch every 24 hours, take off the current patch before applying a new one.  We are  checking labs today, will be in contact with any results that require further attention  We are getting an xray today. We will be in contact with any abnormal results that require further attention.  Return in about 3 months (around 03/31/2023) for follow up.   Moshe Cipro, FNP

## 2022-12-31 NOTE — Patient Instructions (Addendum)
We have placed a referral for you to the allergist and to a therapist today. Someone will be reaching out to get you scheduled.  I have sent in nicotine patches for you to try to help you quit smoking. May use one patch every 24 hours, take off the current patch before applying a new one.  We are checking labs today, will be in contact with any results that require further attention  We are getting an xray today. We will be in contact with any abnormal results that require further attention.

## 2022-12-31 NOTE — Addendum Note (Signed)
Addended by: Sherald Barge on: 12/31/2022 01:40 PM   Modules accepted: Orders

## 2023-01-01 LAB — IRON,TIBC AND FERRITIN PANEL
%SAT: 4 % — ABNORMAL LOW (ref 16–45)
Ferritin: 4 ng/mL — ABNORMAL LOW (ref 16–154)
Iron: 18 ug/dL — ABNORMAL LOW (ref 40–190)
TIBC: 489 ug/dL — ABNORMAL HIGH (ref 250–450)

## 2023-01-03 NOTE — Addendum Note (Signed)
Addended by: Sherald Barge on: 01/03/2023 01:26 PM   Modules accepted: Orders

## 2023-01-03 NOTE — Progress Notes (Signed)
We looked at your iron panel and ferritin, they are pretty low.  They are lower than I expected them to be.  I am not sure that the oral iron is going to be enough.  I am going to refer you to hematology, the hematologist are in the cancer center, so expect [facility] to show up on the caller ID.  Someone will call you from their office to get you scheduled, to help [place] figure out why your iron is still low.

## 2023-01-15 ENCOUNTER — Encounter (HOSPITAL_COMMUNITY): Payer: Self-pay

## 2023-01-15 ENCOUNTER — Ambulatory Visit (HOSPITAL_COMMUNITY)
Admission: EM | Admit: 2023-01-15 | Discharge: 2023-01-15 | Disposition: A | Discharge status: Home / Self Care | Payer: BC Managed Care – PPO | Attending: Internal Medicine | Admitting: Internal Medicine

## 2023-01-15 DIAGNOSIS — N898 Other specified noninflammatory disorders of vagina: Secondary | ICD-10-CM | POA: Diagnosis not present

## 2023-01-15 DIAGNOSIS — N760 Acute vaginitis: Secondary | ICD-10-CM | POA: Insufficient documentation

## 2023-01-15 DIAGNOSIS — B9689 Other specified bacterial agents as the cause of diseases classified elsewhere: Secondary | ICD-10-CM | POA: Insufficient documentation

## 2023-01-15 DIAGNOSIS — Z113 Encounter for screening for infections with a predominantly sexual mode of transmission: Secondary | ICD-10-CM | POA: Insufficient documentation

## 2023-01-15 LAB — POCT URINALYSIS DIP (MANUAL ENTRY)
Bilirubin, UA: NEGATIVE
Blood, UA: NEGATIVE
Glucose, UA: NEGATIVE mg/dL
Ketones, POC UA: NEGATIVE mg/dL
Nitrite, UA: NEGATIVE
Protein Ur, POC: NEGATIVE mg/dL
Spec Grav, UA: 1.015 (ref 1.010–1.025)
Urobilinogen, UA: 0.2 U/dL
pH, UA: 7.5 (ref 5.0–8.0)

## 2023-01-15 LAB — POCT URINE PREGNANCY: Preg Test, Ur: NEGATIVE

## 2023-01-15 MED ORDER — FLUCONAZOLE 150 MG PO TABS: 150.0000 mg | ORAL_TABLET | Freq: Every day | ORAL | 0 refills | Status: AC

## 2023-01-15 MED ORDER — METRONIDAZOLE 500 MG PO TABS: 500.0000 mg | ORAL_TABLET | Freq: Two times a day (BID) | ORAL | 0 refills | Status: DC

## 2023-01-15 NOTE — ED Provider Notes (Signed)
[facility]    CSN: [id number] Arrival date & time: 01/15/23  1345      History   Chief Complaint Chief Complaint  Patient presents with   Vaginal Discharge    HPI Crystal Walters is a 27 y.o. female.   27 year old female who presents to urgent care with complaints of vaginal discharge with foul odor.  This started about 4 days ago.  She has had similar symptoms when she has had bacterial vaginosis before.  She reports she was also concerned because her urine is very yellow even though she drinks plenty of water.  She denies dysuria, hematuria, suprapubic pain, abdominal pain, nausea or vomiting.  She is not having any vaginal pain but is having some irritation and itching.   Vaginal Discharge Associated symptoms: no abdominal pain, no dysuria, no fever and no vomiting     Past Medical History:  Diagnosis Date   Chlamydia    Depression    Seizures (HCC)    childhood   Vaginal Pap smear, abnormal     Patient Active Problem List   Diagnosis Date Noted   Genetic testing 12/16/2022   Indication for care in labor or delivery 10/21/2018    Past Surgical History:  Procedure Laterality Date   NO PAST SURGERIES      OB History     Gravida  2   Para  2   Term  2   Preterm      AB      Living  2      SAB      IAB      Ectopic      Multiple  0   Live Births  2            Home Medications    Prior to Admission medications   Medication Sig Start Date End Date Taking? Authorizing Provider  Iron, Ferrous Sulfate, 325 (65 Fe) MG TABS Take 325 mg by mouth daily. 12/31/22   Moshe Cipro, FNP  nicotine (NICODERM CQ - DOSED IN MG/24 HR) 7 mg/24hr patch Place 1 patch (7 mg total) onto the skin daily. 12/31/22   Moshe Cipro, FNP  omeprazole (PRILOSEC) 20 MG capsule Take 1 capsule (20 mg total) by mouth daily. 02/23/19 03/22/19  Georgetta Haber, NP    Family History Family History  Problem Relation Age of Onset   Hypertension  Mother    Cervical cancer Paternal Grandmother    Thyroid cancer Paternal Grandmother        medullary; dx >50   Cancer Half-Sister        "ectodermal"; dx 6 months; paternal half isster   Pancreatic cancer Other        PGF's brother (dx 27s) and PGF's mother (dx 51s)   Ovarian cancer Other        PGF's mother (d. 85)   Colon cancer Other        unkown maternal relative    Social History Social History   Tobacco Use   Smoking status: Every Day    Types: Cigars   Smokeless tobacco: Never   Tobacco comments:    Black & Milds  Vaping Use   Vaping status: Never Used  Substance Use Topics   Alcohol use: Yes    Comment: occasionally   Drug use: Yes    Types: Marijuana    Comment: occasionally     Allergies   Patient has no known allergies.   Review of Systems  Review of Systems  Constitutional:  Negative for chills and fever.  HENT:  Negative for ear pain and sore throat.   Eyes:  Negative for pain and visual disturbance.  Respiratory:  Negative for cough and shortness of breath.   Cardiovascular:  Negative for chest pain and palpitations.  Gastrointestinal:  Negative for abdominal pain and vomiting.  Genitourinary:  Positive for vaginal discharge. Negative for difficulty urinating, dysuria, frequency, hematuria and urgency.       Vaginal itching  Musculoskeletal:  Negative for arthralgias and back pain.  Skin:  Negative for color change and rash.  Neurological:  Negative for seizures and syncope.  All other systems reviewed and are negative.    Physical Exam Triage Vital Signs ED Triage Vitals  Encounter Vitals Group     BP 01/15/23 1400 120/75     Systolic BP Percentile --      Diastolic BP Percentile --      Pulse Rate 01/15/23 1400 95     Resp 01/15/23 1400 18     Temp 01/15/23 1400 98.1 F (36.7 C)     Temp Source 01/15/23 1400 Oral     SpO2 01/15/23 1400 98 %     Weight 01/15/23 1359 146 lb (66.2 kg)     Height 01/15/23 1359 5\' 7"  (1.702 m)      Head Circumference --      Peak Flow --      Pain Score 01/15/23 1359 0     Pain Loc --      Pain Education --      Exclude from Growth Chart --    No data found.  Updated Vital Signs BP 120/75 (BP Location: Right Arm)   Pulse 95   Temp 98.1 F (36.7 C) (Oral)   Resp 18   Ht 5\' 7"  (1.702 m)   Wt 146 lb (66.2 kg)   LMP 12/24/2022 (Exact Date)   SpO2 98%   BMI 22.87 kg/m   Visual Acuity Right Eye Distance:   Left Eye Distance:   Bilateral Distance:    Right Eye Near:   Left Eye Near:    Bilateral Near:     Physical Exam Vitals and nursing note reviewed.  Constitutional:      General: She is not in acute distress.    Appearance: She is well-developed.  HENT:     Head: Normocephalic and atraumatic.  Eyes:     Conjunctiva/sclera: Conjunctivae normal.  Cardiovascular:     Rate and Rhythm: Normal rate and regular rhythm.     Heart sounds: No murmur heard. Pulmonary:     Effort: Pulmonary effort is normal. No respiratory distress.     Breath sounds: Normal breath sounds.  Abdominal:     Palpations: Abdomen is soft.     Tenderness: There is no abdominal tenderness.  Musculoskeletal:        General: No swelling.     Cervical back: Neck supple.  Skin:    General: Skin is warm and dry.     Capillary Refill: Capillary refill takes less than 2 seconds.  Neurological:     Mental Status: She is alert.  Psychiatric:        Mood and Affect: Mood normal.      UC Treatments / Results  Labs (all labs ordered are listed, but only abnormal results are displayed) Labs Reviewed  POCT URINALYSIS DIP (MANUAL ENTRY)  POCT URINE PREGNANCY  CERVICOVAGINAL ANCILLARY ONLY    EKG  Radiology No results found.  Procedures Procedures (including critical care time)  Medications Ordered in UC Medications - No data to display  Initial Impression / Assessment and Plan / UC Course  I have reviewed the triage vital signs and the nursing notes.  Pertinent labs & imaging  results that were available during my care of the patient were reviewed by me and considered in my medical decision making (see chart for details).     Vaginal discharge   Symptoms most consistent with BV. We will treat with the following:  Metronidazole 500mg  twice daily for 7 days Diflucan 150mg  On day 3 of taking antibiotics, take the first dose, then repeat 3 days later.   We are treating you for bacterial vaginosis.  Please take metronidazole twice daily for 7 days.  Do not drink any alcohol with this medication for 3 days after completing course as it will cause you to vomit.  We will contact you if we need to arrange additional treatment based on your testing. Abstain from sex until you receive your final results.  Use a condom for the sexual encounter.  Use hypoallergenic soaps and detergents and wear loosefitting cotton underwear.  If you have any worsening or changing symptoms including abnormal discharge, pelvic pain, abdominal pain, fever, nausea, vomiting you should be reevaluated.   Final Clinical Impressions(s) / UC Diagnoses   Final diagnoses:  None     Discharge Instructions        We are treating you for bacterial vaginosis.  Please take metronidazole twice daily for 7 days.  Do not drink any alcohol with this medication for 3 days after completing course as it will cause you to vomit.  We will contact you if we need to arrange additional treatment based on your testing. Abstain from sex until you receive your final results.  Use a condom for the sexual encounter.  Use hypoallergenic soaps and detergents and wear loosefitting cotton underwear.  If you have any worsening or changing symptoms including abnormal discharge, pelvic pain, abdominal pain, fever, nausea, vomiting you should be reevaluated.    ED Prescriptions   None    PDMP not reviewed this encounter.   Landis Martins, [place] 01/15/23 1423

## 2023-01-15 NOTE — ED Triage Notes (Signed)
Pt presents with vaginal discharge with foul odor x 4 days. Pt states "I may have a UTI too. I have been drinking plenty of water but my pee is still very yellow." Pt currently denies pain. Pt also denies taking medications for symptoms. Pt requesting to be tested "for all the things just to be sure. I think it is bacterial vaginosis."

## 2023-01-15 NOTE — Discharge Instructions (Addendum)
Symptoms most consistent with BV. We will treat with the following:  Metronidazole 500mg  twice daily for 7 days Diflucan 150mg  On day 3 of taking antibiotics, take the first dose, then repeat 3 days later.   We are treating you for bacterial vaginosis.  Please take metronidazole twice daily for 7 days.  Do not drink any alcohol with this medication for 3 days after completing course as it will cause you to vomit.  We will contact you if we need to arrange additional treatment based on your testing. Abstain from sex until you receive your final results.  Use a condom for the sexual encounter.  Use hypoallergenic soaps and detergents and wear loosefitting cotton underwear.  If you have any worsening or changing symptoms including abnormal discharge, pelvic pain, abdominal pain, fever, nausea, vomiting you should be reevaluated.

## 2023-01-18 LAB — CERVICOVAGINAL ANCILLARY ONLY
Bacterial Vaginitis (gardnerella): POSITIVE — AB
Candida Glabrata: NEGATIVE
Candida Vaginitis: NEGATIVE
Chlamydia: NEGATIVE
Comment: NEGATIVE
Comment: NEGATIVE
Comment: NEGATIVE
Comment: NEGATIVE
Comment: NEGATIVE
Comment: NORMAL
Neisseria Gonorrhea: NEGATIVE
Trichomonas: NEGATIVE

## 2023-02-05 ENCOUNTER — Inpatient Hospital Stay: Payer: BC Managed Care – PPO | Admitting: Hematology

## 2023-02-21 ENCOUNTER — Ambulatory Visit (INDEPENDENT_AMBULATORY_CARE_PROVIDER_SITE_OTHER): Payer: BC Managed Care – PPO | Admitting: Allergy

## 2023-02-21 ENCOUNTER — Other Ambulatory Visit: Payer: Self-pay

## 2023-02-21 ENCOUNTER — Encounter: Payer: Self-pay | Admitting: Allergy

## 2023-02-21 VITALS — BP 110/84 | HR 83 | Temp 98.2°F | Resp 16 | Ht 67.32 in | Wt 146.4 lb

## 2023-02-21 DIAGNOSIS — E049 Nontoxic goiter, unspecified: Secondary | ICD-10-CM | POA: Diagnosis not present

## 2023-02-21 DIAGNOSIS — F1290 Cannabis use, unspecified, uncomplicated: Secondary | ICD-10-CM

## 2023-02-21 DIAGNOSIS — Z720 Tobacco use: Secondary | ICD-10-CM | POA: Diagnosis not present

## 2023-02-21 DIAGNOSIS — R0989 Other specified symptoms and signs involving the circulatory and respiratory systems: Secondary | ICD-10-CM | POA: Diagnosis not present

## 2023-02-21 DIAGNOSIS — L2389 Allergic contact dermatitis due to other agents: Secondary | ICD-10-CM | POA: Diagnosis not present

## 2023-02-21 NOTE — Patient Instructions (Addendum)
During today's visit, we discussed your concerns about throat tightness and swelling after smoking marijuana, as well as your history of vomiting associated with marijuana use. We also reviewed your tobacco use, eczema, and possible thyroid enlargement.   We discussed the need to discontinue marijuana and reduce tobacco use. You have an upcoming appointment with endocrinology for further evaluation of your thyroid.  There is no testing available for marijuana.   PLAN:  -MARIJUANA USE: You have been experiencing throat tightness and occasional vomiting after smoking marijuana. It is important to discontinue marijuana use to avoid these symptoms.  -POSSIBLE THYROID ENLARGEMENT: It is important to continue with your upcoming endocrinology appointment for further evaluation.  -ECZEMA: You have eczema around your neck that is triggered by certain materials. Since it resolves on its own and is not severe, no treatment is needed at this time. See below for proper skin care.   -TOBACCO USE: You smoke 2-3 cigars daily, which can cause throat irritation and other health risks. It is advisable to reduce and eventually discontinue tobacco use.  INSTRUCTIONS:  Please discontinue marijuana use and try to reduce your tobacco use. Continue with your planned endocrinology appointment for further evaluation of your thyroid.   Follow up as needed.   Skin care recommendations  Bath time: Always use lukewarm water. AVOID very hot or cold water. Keep bathing time to 5-10 minutes. Do NOT use bubble bath. Use a mild soap and use just enough to wash the dirty areas. Do NOT scrub skin vigorously.  After bathing, pat dry your skin with a towel. Do NOT rub or scrub the skin.  Moisturizers and prescriptions:  ALWAYS apply moisturizers immediately after bathing (within 3 minutes). This helps to lock-in moisture. Use the moisturizer several times a day over the whole body. Good summer moisturizers include:  Aveeno, CeraVe, Cetaphil. Good winter moisturizers include: Aquaphor, Vaseline, Cerave, Cetaphil, Eucerin, Vanicream. When using moisturizers along with medications, the moisturizer should be applied about one hour after applying the medication to prevent diluting effect of the medication or moisturize around where you applied the medications. When not using medications, the moisturizer can be continued twice daily as maintenance.  Laundry and clothing: Avoid laundry products with added color or perfumes. Use unscented hypo-allergenic laundry products such as Tide free, Cheer free & gentle, and All free and clear.  If the skin still seems dry or sensitive, you can try double-rinsing the clothes. Avoid tight or scratchy clothing such as wool. Do not use fabric softeners or dyer sheets.

## 2023-02-21 NOTE — Progress Notes (Signed)
New Patient Note  RE: Crystal Walters MRN: [id number] DOB: 06/24/1995 Date of Office Visit: 02/21/2023  Consult requested by: Moshe Cipro, FNP Primary care provider: Moshe Cipro, FNP  Chief Complaint: Allergic Reaction  History of Present Illness: I had the pleasure of seeing Crystal Walters for initial evaluation at the [facility] on 02/21/2023. She is a 28 y.o. female, who is referred here by Moshe Cipro, FNP for the evaluation of allergic reactions, PND.  Discussed the use of AI scribe software for clinical note transcription with the patient, who gave verbal consent to proceed.  The patient presents with a chief complaint of throat tightness immediately after smoking marijuana. She reports that this sensation lasts for a few hours and is not associated with any other symptoms. The patient also mentions a history of vomiting and inability to keep food down after smoking marijuana in larger quantities. This would only occur every few months and not after every marijuana exposure. These episodes of vomiting are not currently happening as she cut down to smoking marijuana 3 times per day.   The patient also smokes cigars, two to three times a day, which she reports causes a sensation of something being present in her throat.   Denies any other associated symptoms of itching, rash, trouble breathing.  Symptoms resolve without any medical intervention.  Denies any PND.  The patient has a history of eczema, which appears around her neck and is triggered by wearing certain materials. She does not use any treatment for it as it resolves on its own. She denies any other allergies to foods or medications.  The patient is currently taking iron supplements and using a nicotine patch. She has no other known medical issues and denies any symptoms of heartburn or reflux.   The patient acknowledges the need to stop smoking marijuana and plans to do so once  her current supply is exhausted. She has not tried other forms of marijuana due to fear of adverse reactions.        Patient had issues with throwing up after smoking marijuana   Throat feels some tightness - this lasts for a few hours and resolves.  12/31/2022 PCP visit: "Reports that she is a current daily smoker of Black and milds.  She does express interest in quitting today.  Inquiring about using nicotine patches today.   States that she smokes marijuana as well.  Reports that when she smokes marijuana, she feels like her throat swells and closes up.  States that this began over the summer, states that she still sometimes smokes marijuana and will just deal with the side effects.   Reports that neck swelling is concerning for her, denies any shortness of breath, trouble swallowing, nausea, vomiting. She is concerned that it may be lymph nodes. States that she feels that this is happening inside her body and is not always visible when she looks in the mirror or to other people. She has never taken any medications to try to treat this.  She does not take any current daily medications."  12/31/2022 chest x-ray: "IMPRESSION: No active cardiopulmonary disease."  Assessment and Plan: Crystal Walters is a 28 y.o. female with: Marijuana use Tobacco use Throat tightness Reports of throat tightness and vomiting associated with marijuana use. No other symptoms of allergic reaction such as itching or rash. Discussed the possibility of irritation or reflux symptoms triggered by smoking. Discussed with patient that there is no testing for marijuana however, given her  symptoms after each exposure recommend avoidance.  Encouraged smoking cessation. Discussed the possibility of smoking irritating larynx/heartburn.   Enlarged thyroid Noted visible thyroid enlargement. No symptoms of thyroid dysfunction reported. Patient has an upcoming appointment with endocrinology. Normal TSH in Dec 2024.  Continue with  endocrinology appointment for further evaluation. Concerning if perhaps this anatomical issue is contributing to her throat tightness sensation as well.   Allergic contact dermatitis due to other agents Reports of contact dermatitis, primarily on the neck. No current treatment as resolves on its own and no flares if avoids certain jewelry. Continue proper skin care. Avoid triggers.  Return if symptoms worsen or fail to improve.  No orders of the defined types were placed in this encounter.  Lab Orders  No laboratory test(s) ordered today    Other allergy screening: Asthma: no Rhino conjunctivitis: no Food allergy: no Medication allergy: no Hymenoptera allergy: no Urticaria: no Eczema: on the neck.  History of recurrent infections suggestive of immunodeficency: no  Diagnostics: None.    Past Medical History: Patient Active Problem List   Diagnosis Date Noted   Genetic testing 12/16/2022   Indication for care in labor or delivery 10/21/2018   Past Medical History:  Diagnosis Date   Angio-edema    Chlamydia    Depression    Eczema    Seizures (HCC)    childhood   Vaginal Pap smear, abnormal    Past Surgical History: Past Surgical History:  Procedure Laterality Date   NO PAST SURGERIES     Medication List:  Current Outpatient Medications  Medication Sig Dispense Refill   Iron, Ferrous Sulfate, 325 (65 Fe) MG TABS Take 325 mg by mouth daily. 30 tablet 1   nicotine (NICODERM CQ - DOSED IN MG/24 HR) 7 mg/24hr patch Place 1 patch (7 mg total) onto the skin daily. 28 patch 3   No current facility-administered medications for this visit.   Allergies: No Known Allergies Social History: Social History   Socioeconomic History   Marital status: Single    Spouse name: Not on file   Number of children: Not on file   Years of education: Not on file   Highest education level: Not on file  Occupational History   Not on file  Tobacco Use   Smoking status: Every  Day    Types: Cigars    Passive exposure: Never   Smokeless tobacco: Never   Tobacco comments:    Black & Milds  Vaping Use   Vaping status: Never Used  Substance and Sexual Activity   Alcohol use: Yes    Comment: occasionally   Drug use: Yes    Types: Marijuana    Comment: occasionally   Sexual activity: Yes    Birth control/protection: Condom    Comment: occasionally  Other Topics Concern   Not on file  Social History Narrative   Not on file   Social Drivers of Health   Financial Resource Strain: Low Risk  (10/17/2018)   Overall Financial Resource Strain (CARDIA)    Difficulty of Paying Living Expenses: Not hard at all  Food Insecurity: No Food Insecurity (10/17/2018)   Hunger Vital Sign    Worried About Running Out of Food in the Last Year: Never true    Ran Out of Food in the Last Year: Never true  Transportation Needs: No Transportation Needs (10/17/2018)   PRAPARE - Administrator, Civil Service (Medical): No    Lack of Transportation (Non-Medical): No  Physical  Activity: Not on file  Stress: Not on file  Social Connections: Unknown (06/09/2021)   Received from [facility], [facility]   Social Network    Social Network: Not on file   Lives in a house. Smoking: smokes marijuana 3 times per day,  Smokes cigars 2-3 per day.  Occupation: Development worker, community HistorySurveyor, minerals in the house: no Engineer, civil (consulting) in the family room: no Carpet in the bedroom: no Heating: gas Cooling: central Pet: yes 1 dog  Family History: Family History  Problem Relation Age of Onset   Eczema Mother    Allergic rhinitis Mother    Hypertension Mother    Allergic rhinitis Father    Asthma Sister    Cervical cancer Paternal Grandmother    Thyroid cancer Paternal Grandmother        medullary; dx >50   Cancer Half-Sister        "ectodermal"; dx 6 months; paternal half isster   Pancreatic cancer Other        PGF's brother (dx 32s) and PGF's mother (dx  3s)   Ovarian cancer Other        PGF's mother (d. 72)   Colon cancer Other        unkown maternal relative   Angioedema Neg Hx    Urticaria Neg Hx     Review of Systems  Constitutional:  Negative for appetite change, chills, fever and unexpected weight change.  HENT:  Negative for congestion and rhinorrhea.        Throat tightness  Eyes:  Negative for itching.  Respiratory:  Negative for cough, chest tightness, shortness of breath and wheezing.   Cardiovascular:  Negative for chest pain.  Gastrointestinal:  Negative for abdominal pain.  Genitourinary:  Negative for difficulty urinating.  Skin:  Negative for rash.  Neurological:  Negative for headaches.    Objective: BP 110/84 (BP Location: Right Arm, Patient Position: Sitting, Cuff Size: Small)   Pulse 83   Temp 98.2 F (36.8 C) (Temporal)   Resp 16   Ht 5' 7.32" (1.71 m)   Wt 146 lb 6.4 oz (66.4 kg)   SpO2 98%   BMI 22.71 kg/m  Body mass index is 22.71 kg/m. Physical Exam Vitals and nursing note reviewed.  Constitutional:      Appearance: Normal appearance. She is well-developed.  HENT:     Head: Normocephalic and atraumatic.     Right Ear: Tympanic membrane and external ear normal.     Left Ear: Tympanic membrane and external ear normal.     Nose: Nose normal.     Mouth/Throat:     Mouth: Mucous membranes are moist.     Pharynx: Oropharynx is clear.     Comments: Elarged thyroid glands b/l.  Eyes:     Conjunctiva/sclera: Conjunctivae normal.  Cardiovascular:     Rate and Rhythm: Normal rate and regular rhythm.     Heart sounds: Normal heart sounds. No murmur heard.    No friction rub. No gallop.  Pulmonary:     Effort: Pulmonary effort is normal.     Breath sounds: Normal breath sounds. No wheezing, rhonchi or rales.  Musculoskeletal:     Cervical back: Neck supple.  Skin:    General: Skin is warm.     Findings: No rash.  Neurological:     Mental Status: She is alert and oriented to person, place,  and time.  Psychiatric:        Behavior: Behavior normal.  The plan was reviewed with the patient/family, and all questions/concerned were addressed.  It was my pleasure to see Crystal Walters today and participate in her care. Please feel free to contact me with any questions or concerns.  Sincerely,  Wyline Mood, DO Allergy & Immunology  [facility] office: [phone] [facility] office: [phone]

## 2023-02-22 ENCOUNTER — Other Ambulatory Visit: Payer: Self-pay | Admitting: Family Medicine

## 2023-02-22 DIAGNOSIS — E049 Nontoxic goiter, unspecified: Secondary | ICD-10-CM

## 2023-02-22 DIAGNOSIS — R09A2 Foreign body sensation, throat: Secondary | ICD-10-CM

## 2023-02-23 ENCOUNTER — Telehealth: Payer: Self-pay | Admitting: Hematology and Oncology

## 2023-02-23 NOTE — Telephone Encounter (Signed)
Spoke with patient confirming upcoming appointment change

## 2023-02-28 ENCOUNTER — Other Ambulatory Visit: Payer: BC Managed Care – PPO

## 2023-03-02 ENCOUNTER — Encounter: Payer: BC Managed Care – PPO | Admitting: Hematology

## 2023-03-02 ENCOUNTER — Inpatient Hospital Stay: Payer: BC Managed Care – PPO | Admitting: Hematology

## 2023-03-02 ENCOUNTER — Ambulatory Visit (HOSPITAL_COMMUNITY)
Admission: EM | Admit: 2023-03-02 | Discharge: 2023-03-02 | Disposition: A | Discharge status: Home / Self Care | Payer: BC Managed Care – PPO | Attending: Emergency Medicine | Admitting: Emergency Medicine

## 2023-03-02 ENCOUNTER — Inpatient Hospital Stay: Payer: BC Managed Care – PPO | Attending: Genetic Counselor | Admitting: Hematology and Oncology

## 2023-03-02 ENCOUNTER — Encounter (HOSPITAL_COMMUNITY): Payer: Self-pay

## 2023-03-02 ENCOUNTER — Encounter: Payer: Self-pay | Admitting: Hematology and Oncology

## 2023-03-02 VITALS — BP 122/97 | HR 86 | Temp 97.9°F | Resp 16 | Ht 67.32 in | Wt 148.9 lb

## 2023-03-02 DIAGNOSIS — Z800 Family history of malignant neoplasm of digestive organs: Secondary | ICD-10-CM | POA: Diagnosis not present

## 2023-03-02 DIAGNOSIS — D500 Iron deficiency anemia secondary to blood loss (chronic): Secondary | ICD-10-CM

## 2023-03-02 DIAGNOSIS — N760 Acute vaginitis: Secondary | ICD-10-CM | POA: Diagnosis not present

## 2023-03-02 DIAGNOSIS — B9689 Other specified bacterial agents as the cause of diseases classified elsewhere: Secondary | ICD-10-CM | POA: Diagnosis not present

## 2023-03-02 DIAGNOSIS — N3000 Acute cystitis without hematuria: Secondary | ICD-10-CM | POA: Insufficient documentation

## 2023-03-02 DIAGNOSIS — F5089 Other specified eating disorder: Secondary | ICD-10-CM | POA: Insufficient documentation

## 2023-03-02 DIAGNOSIS — G40909 Epilepsy, unspecified, not intractable, without status epilepticus: Secondary | ICD-10-CM | POA: Diagnosis not present

## 2023-03-02 DIAGNOSIS — E041 Nontoxic single thyroid nodule: Secondary | ICD-10-CM | POA: Diagnosis not present

## 2023-03-02 DIAGNOSIS — A749 Chlamydial infection, unspecified: Secondary | ICD-10-CM | POA: Insufficient documentation

## 2023-03-02 DIAGNOSIS — Z8041 Family history of malignant neoplasm of ovary: Secondary | ICD-10-CM | POA: Insufficient documentation

## 2023-03-02 DIAGNOSIS — N390 Urinary tract infection, site not specified: Secondary | ICD-10-CM | POA: Diagnosis not present

## 2023-03-02 DIAGNOSIS — F17210 Nicotine dependence, cigarettes, uncomplicated: Secondary | ICD-10-CM | POA: Insufficient documentation

## 2023-03-02 DIAGNOSIS — D509 Iron deficiency anemia, unspecified: Secondary | ICD-10-CM | POA: Diagnosis not present

## 2023-03-02 DIAGNOSIS — R609 Edema, unspecified: Secondary | ICD-10-CM | POA: Insufficient documentation

## 2023-03-02 LAB — POCT URINALYSIS DIP (MANUAL ENTRY)
Bilirubin, UA: NEGATIVE
Glucose, UA: NEGATIVE mg/dL
Ketones, POC UA: NEGATIVE mg/dL
Leukocytes, UA: NEGATIVE
Nitrite, UA: POSITIVE — AB
Protein Ur, POC: NEGATIVE mg/dL
Spec Grav, UA: >=1.03 — AB
Urobilinogen, UA: 0.2 U/dL
pH, UA: 6

## 2023-03-02 MED ORDER — METRONIDAZOLE 500 MG PO TABS: 500.0000 mg | ORAL_TABLET | Freq: Two times a day (BID) | ORAL | 0 refills | Status: DC

## 2023-03-02 MED ORDER — NITROFURANTOIN MONOHYD MACRO 100 MG PO CAPS: 100.0000 mg | ORAL_CAPSULE | Freq: Two times a day (BID) | ORAL | 0 refills | Status: DC

## 2023-03-02 MED ORDER — FLUCONAZOLE 150 MG PO TABS: ORAL_TABLET | ORAL | 0 refills | Status: DC

## 2023-03-02 NOTE — Assessment & Plan Note (Signed)
 Iron  Deficiency Anemia Longstanding iron  deficiency with pica (eating dirt) and recent diagnosis of anemia. Currently on oral iron  supplementation for a couple of weeks. No symptoms of fatigue, hair loss, shortness of breath, difficulty swallowing, brittle nails, or lightheadedness. Menstrual cycles are regular and not heavy. -Continue oral iron  supplementation daily for six months. -Return in six months for follow-up and labs to assess response to treatment. -If unable to tolerate oral iron  due to side effects, contact the office for alternative treatment options. She is absolutely not interested in IV iron   Thyroid  Nodule Patient has a thyroid  nodule and is scheduled for an ultrasound. No symptoms of difficulty swallowing or throat fullness. Family history of pancreatic cancer on father's side. Genetic testing was negative. -Continue with scheduled ultrasound. -Depending on ultrasound results, a biopsy may be needed to rule out malignancy.  -If pregnant, contact the office for follow-up due to potential rapid drop in iron  stores.  Urinary Tract Infection Recent diagnosis of UTI and started on antibiotics. -Continue prescribed antibiotics as directed by primary care provider.

## 2023-03-02 NOTE — Discharge Instructions (Addendum)
 You have a urinary tract infection.  Take the Macrobid  to treat this.  Ensure you are drinking at least 64 ounces of water daily to help flush your kidneys.  If you develop any burning you can take over-the-counter AZO or Pyridium  to help with the sensation.  We are treating you for bacterial vaginosis with Flagyl .  Our staff will contact if any treatment modification is needed based on the results of your cytology swab.  I have sent in some Diflucan  to help prevent vaginal yeast infections since we are placing on 2 different antibiotics.  Return to clinic or follow-up with your primary care provider for any new or persistent symptoms.

## 2023-03-02 NOTE — ED Triage Notes (Signed)
 Patient present to the office for vaginal discharge and dysuria. Patient reports her urine smells.

## 2023-03-02 NOTE — ED Provider Notes (Signed)
 [facility]    CSN: [id number] Arrival date & time: 03/02/23  0808      History   Chief Complaint No chief complaint on file.   HPI Crystal Walters is a 28 y.o. female.   Patient presents to clinic for complaints of a urinary odor that has been present for the past few weeks.  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  She maybe had some dysuria and some abdominal discomfort, is unsure.  Has not had any fevers, flank pain, nausea or vomiting.  History of recurrent BV and reports vaginal discharge with odor.  No itching or irritation.  The history is provided by the patient and medical records.    Past Medical History:  Diagnosis Date   Angio-edema    Chlamydia    Depression    Eczema    Seizures (HCC)    childhood   Vaginal Pap smear, abnormal     Patient Active Problem List   Diagnosis Date Noted   Genetic testing 12/16/2022   Indication for care in labor or delivery 10/21/2018    Past Surgical History:  Procedure Laterality Date   NO PAST SURGERIES      OB History     Gravida  2   Para  2   Term  2   Preterm      AB      Living  2      SAB      IAB      Ectopic      Multiple  0   Live Births  2            Home Medications    Prior to Admission medications   Medication Sig Start Date End Date Taking? Authorizing Provider  fluconazole  (DIFLUCAN ) 150 MG tablet Take 1 tablet today, 1 on day 3 of antibiotics and 1 on your last day of antibiotics to help prevent a vaginal yeast infection. 03/02/23  Yes Alfonso Shackett  N, FNP  Iron , Ferrous Sulfate , 325 (65 Fe) MG TABS Take 325 mg by mouth daily. 12/31/22  Yes Alvia Krabbe, FNP  metroNIDAZOLE  (FLAGYL ) 500 MG tablet Take 1 tablet (500 mg total) by mouth 2 (two) times daily. 03/02/23  Yes Jarius Dieudonne  N, FNP  nitrofurantoin , macrocrystal-monohydrate, (MACROBID ) 100 MG capsule Take 1 capsule (100 mg total) by mouth 2 (two) times daily. 03/02/23  Yes  Quinnley Colasurdo  N, FNP  nicotine  (NICODERM CQ  - DOSED IN MG/24 HR) 7 mg/24hr patch Place 1 patch (7 mg total) onto the skin daily. 12/31/22   Alvia Krabbe, FNP  omeprazole  (PRILOSEC) 20 MG capsule Take 1 capsule (20 mg total) by mouth daily. 02/23/19 03/22/19  Tellis Laneta NOVAK, NP    Family History Family History  Problem Relation Age of Onset   Eczema Mother    Allergic rhinitis Mother    Hypertension Mother    Allergic rhinitis Father    Asthma Sister    Cervical cancer Paternal Grandmother    Thyroid  cancer Paternal Grandmother        medullary; dx >50   Cancer Half-Sister        ectodermal; dx 6 months; paternal half isster   Pancreatic cancer Other        PGF's brother (dx 85s) and PGF's mother (dx 59s)   Ovarian cancer Other        PGF's mother (d. 61)   Colon cancer Other  unkown maternal relative   Angioedema Neg Hx    Urticaria Neg Hx     Social History Social History   Tobacco Use   Smoking status: Every Day    Types: Cigars    Passive exposure: Never   Smokeless tobacco: Never   Tobacco comments:    Black & Milds  Vaping Use   Vaping status: Never Used  Substance Use Topics   Alcohol use: Yes    Comment: occasionally   Drug use: Yes    Types: Marijuana    Comment: occasionally     Allergies   Patient has no known allergies.   Review of Systems Review of Systems  Per HPI   Physical Exam Triage Vital Signs ED Triage Vitals [03/02/23 0850]  Encounter Vitals Group     BP 129/62     Systolic BP Percentile      Diastolic BP Percentile      Pulse Rate 98     Resp 16     Temp 98.3 F (36.8 C)     Temp Source Oral     SpO2 98 %     Weight      Height      Head Circumference      Peak Flow      Pain Score      Pain Loc      Pain Education      Exclude from Growth Chart    No data found.  Updated Vital Signs BP 129/62 (BP Location: Left Arm)   Pulse 98   Temp 98.3 F (36.8 C) (Oral)   Resp 16   LMP 02/11/2023  (Approximate)   SpO2 98%   Visual Acuity Right Eye Distance:   Left Eye Distance:   Bilateral Distance:    Right Eye Near:   Left Eye Near:    Bilateral Near:     Physical Exam Vitals and nursing note reviewed.  Constitutional:      Appearance: Normal appearance.  HENT:     Head: Normocephalic and atraumatic.     Right Ear: External ear normal.     Left Ear: External ear normal.     Nose: Nose normal.     Mouth/Throat:     Mouth: Mucous membranes are moist.  Eyes:     Conjunctiva/sclera: Conjunctivae normal.  Cardiovascular:     Rate and Rhythm: Normal rate.  Pulmonary:     Effort: Pulmonary effort is normal. No respiratory distress.  Abdominal:     Tenderness: There is no right CVA tenderness or left CVA tenderness.  Musculoskeletal:        General: Normal range of motion.  Skin:    General: Skin is warm and dry.  Neurological:     General: No focal deficit present.     Mental Status: She is alert and oriented to person, place, and time.  Psychiatric:        Mood and Affect: Mood normal.        Behavior: Behavior normal.      UC Treatments / Results  Labs (all labs ordered are listed, but only abnormal results are displayed) Labs Reviewed  POCT URINALYSIS DIP (MANUAL ENTRY) - Abnormal; Notable for the following components:      Result Value   Clarity, UA hazy (*)    Spec Grav, UA >=1.030 (*)    Blood, UA small (*)    Nitrite, UA Positive (*)    All other components within normal limits  URINE CULTURE  CERVICOVAGINAL ANCILLARY ONLY    EKG   Radiology No results found.  Procedures Procedures (including critical care time)  Medications Ordered in UC Medications - No data to display  Initial Impression / Assessment and Plan / UC Course  I have reviewed the triage vital signs and the nursing notes.  Pertinent labs & imaging results that were available during my care of the patient were reviewed by me and considered in my medical decision making  (see chart for details).  Vitals and triage reviewed, patient is hemodynamically stable.  Negative for CVA tenderness, afebrile, without tachycardia or signs or symptoms of pyelonephritis.  Urinalysis shows small red blood cells and is nitrite positive, will send for culture to ensure proper treatment.  Will treat with Macrobid  for acute cystitis.  Vaginal discharge consistent with previous BV presentations, will treat with Flagyl .  Will send in Diflucan  to help prevent any vaginal yeast infections.  Staff will contact if treatment modification is needed based off cytology.  Plan of care, follow-up care return precautions given, no questions at this time.     Final Clinical Impressions(s) / UC Diagnoses   Final diagnoses:  Acute cystitis without hematuria  Acute vaginitis     Discharge Instructions      You have a urinary tract infection.  Take the Macrobid  to treat this.  Ensure you are drinking at least 64 ounces of water daily to help flush your kidneys.  If you develop any burning you can take over-the-counter AZO or Pyridium  to help with the sensation.  We are treating you for bacterial vaginosis with Flagyl .  Our staff will contact if any treatment modification is needed based on the results of your cytology swab.  I have sent in some Diflucan  to help prevent vaginal yeast infections since we are placing on 2 different antibiotics.  Return to clinic or follow-up with your primary care provider for any new or persistent symptoms.     ED Prescriptions     Medication Sig Dispense Auth. Provider   nitrofurantoin , macrocrystal-monohydrate, (MACROBID ) 100 MG capsule Take 1 capsule (100 mg total) by mouth 2 (two) times daily. 10 capsule Dreama, Montserrat Shek  N, FNP   metroNIDAZOLE  (FLAGYL ) 500 MG tablet Take 1 tablet (500 mg total) by mouth 2 (two) times daily. 14 tablet Dreama, Karem Tomaso  N, FNP   fluconazole  (DIFLUCAN ) 150 MG tablet Take 1 tablet today, 1 on day 3 of antibiotics and 1 on  your last day of antibiotics to help prevent a vaginal yeast infection. 3 tablet Dreama, Denelle Capurro  N, FNP      PDMP not reviewed this encounter.   Dreama March SAILOR, [place] 03/02/23 [phone]

## 2023-03-02 NOTE — Progress Notes (Signed)
 [facility] CONSULT NOTE  Patient Care Team: Alvia Krabbe, FNP as PCP - General (Family Medicine)  CHIEF COMPLAINTS/PURPOSE OF CONSULTATION:  IDA  ASSESSMENT & PLAN:  IDA (iron  deficiency anemia) Iron  Deficiency Anemia Longstanding iron  deficiency with pica (eating dirt) and recent diagnosis of anemia. Currently on oral iron  supplementation for a couple of weeks. No symptoms of fatigue, hair loss, shortness of breath, difficulty swallowing, brittle nails, or lightheadedness. Menstrual cycles are regular and not heavy. -Continue oral iron  supplementation daily for six months. -Return in six months for follow-up and labs to assess response to treatment. -If unable to tolerate oral iron  due to side effects, contact the office for alternative treatment options. She is absolutely not interested in IV iron   Thyroid  Nodule Patient has a thyroid  nodule and is scheduled for an ultrasound. No symptoms of difficulty swallowing or throat fullness. Family history of pancreatic cancer on father's side. Genetic testing was negative. -Continue with scheduled ultrasound. -Depending on ultrasound results, a biopsy may be needed to rule out malignancy.  -If pregnant, contact the office for follow-up due to potential rapid drop in iron  stores.  Urinary Tract Infection Recent diagnosis of UTI and started on antibiotics. -Continue prescribed antibiotics as directed by primary care provider.  No orders of the defined types were placed in this encounter.    HISTORY OF PRESENTING ILLNESS:  Crystal Walters 28 y.o. female is here because of IDA  Crystal Walters is a 28 year old female with iron  deficiency anemia who presents for evaluation of iron  deficiency. She was referred by her primary care doctor for evaluation of iron  deficiency anemia.  She has been experiencing iron  deficiency anemia, recently identified by her primary care provider. She has been taking oral iron  supplements  once daily for the past few weeks, although she occasionally misses a dose. No fatigue, hair loss, shortness of breath, difficulty swallowing, brittle nails, or lightheadedness upon standing. She is able to exercise without any issues.  She reports a history of pica, specifically eating dirt, which she has been doing for several years. She has not consumed dirt for about a month and a week, attributing this change to her recent iron  supplementation.  Her menstrual cycles are regular, occurring once a month, sometimes twice, and are not excessively heavy. She uses one pad on the first day, two on the second, and three on the third day of her cycle. She has two children, the youngest being four years old, and breastfed both for six months.  She recently visited urgent care for a urinary tract infection and started antibiotics today. She has a history of recurrent yeast infections for which she has taken antifungal medications multiple times.  Her family history is significant for anemia in her mother and pancreatic cancer in her paternal uncle. She has undergone genetic testing, which was negative for hereditary cancer syndromes.  She works as a programmer, multimedia for [employer]. All other systems were reviewed with the patient and are negative.  MEDICAL HISTORY:  Past Medical History:  Diagnosis Date   Angio-edema    Chlamydia    Depression    Eczema    Seizures (HCC)    childhood   Vaginal Pap smear, abnormal     SURGICAL HISTORY: Past Surgical History:  Procedure Laterality Date   NO PAST SURGERIES      SOCIAL HISTORY: Social History   Socioeconomic History   Marital status: Single    Spouse name: Not on file  Number of children: Not on file   Years of education: Not on file   Highest education level: Not on file  Occupational History   Not on file  Tobacco Use   Smoking status: Every Day    Types: Cigars    Passive exposure: Never   Smokeless  tobacco: Never   Tobacco comments:    Black & Milds  Vaping Use   Vaping status: Never Used  Substance and Sexual Activity   Alcohol use: Yes    Comment: occasionally   Drug use: Yes    Types: Marijuana    Comment: occasionally   Sexual activity: Yes    Birth control/protection: Condom    Comment: occasionally  Other Topics Concern   Not on file  Social History Narrative   Not on file   Social Drivers of Health   Financial Resource Strain: Low Risk  (10/17/2018)   Overall Financial Resource Strain (CARDIA)    Difficulty of Paying Living Expenses: Not hard at all  Food Insecurity: No Food Insecurity (10/17/2018)   Hunger Vital Sign    Worried About Running Out of Food in the Last Year: Never true    Ran Out of Food in the Last Year: Never true  Transportation Needs: No Transportation Needs (10/17/2018)   PRAPARE - Administrator, Civil Service (Medical): No    Lack of Transportation (Non-Medical): No  Physical Activity: Not on file  Stress: Not on file  Social Connections: Unknown (06/09/2021)   Received from [facility], [facility]   Social Network    Social Network: Not on file  Intimate Partner Violence: Unknown (05/01/2021)   Received from [facility], [facility]   HITS    Physically Hurt: Not on file    Insult or Talk Down To: Not on file    Threaten Physical Harm: Not on file    Scream or Curse: Not on file    FAMILY HISTORY: Family History  Problem Relation Age of Onset   Eczema Mother    Allergic rhinitis Mother    Hypertension Mother    Allergic rhinitis Father    Asthma Sister    Cervical cancer Paternal Grandmother    Thyroid  cancer Paternal Grandmother        medullary; dx >50   Cancer Half-Sister        ectodermal; dx 6 months; paternal half isster   Pancreatic cancer Other        PGF's brother (dx 37s) and PGF's mother (dx 49s)   Ovarian cancer Other        PGF's mother (d. 65)   Colon cancer Other        unkown maternal  relative   Angioedema Neg Hx    Urticaria Neg Hx     ALLERGIES:  has no known allergies.  MEDICATIONS:  Current Outpatient Medications  Medication Sig Dispense Refill   fluconazole  (DIFLUCAN ) 150 MG tablet Take 1 tablet today, 1 on day 3 of antibiotics and 1 on your last day of antibiotics to help prevent a vaginal yeast infection. 3 tablet 0   Iron , Ferrous Sulfate , 325 (65 Fe) MG TABS Take 325 mg by mouth daily. 30 tablet 1   metroNIDAZOLE  (FLAGYL ) 500 MG tablet Take 1 tablet (500 mg total) by mouth 2 (two) times daily. 14 tablet 0   nicotine  (NICODERM CQ  - DOSED IN MG/24 HR) 7 mg/24hr patch Place 1 patch (7 mg total) onto the skin daily. 28 patch 3  nitrofurantoin , macrocrystal-monohydrate, (MACROBID ) 100 MG capsule Take 1 capsule (100 mg total) by mouth 2 (two) times daily. 10 capsule 0   No current facility-administered medications for this visit.     PHYSICAL EXAMINATION: ECOG PERFORMANCE STATUS: 0 - Asymptomatic  Vitals:   03/02/23 1523  BP: (!) 122/97  Pulse: 86  Resp: 16  Temp: 97.9 F (36.6 C)  SpO2: 100%   Filed Weights   03/02/23 1523  Weight: 148 lb 14.4 oz (67.5 kg)    GENERAL:alert, no distress and comfortable NECK: supple, thyroid  normal size, non-tender, without nodularity, enlarged thyroid  noted. LYMPH:  no palpable lymphadenopathy in the cervical, axillary  LUNGS: clear to auscultation and percussion with normal breathing effort HEART: regular rate & rhythm and no murmurs and no lower extremity edema ABDOMEN:abdomen soft, non-tender and normal bowel sounds Musculoskeletal:no cyanosis of digits and no clubbing  PSYCH: alert & oriented x 3 with fluent speech NEURO: no focal motor/sensory deficits  LABORATORY DATA:  I have reviewed the data as listed Lab Results  Component Value Date   WBC 4.8 12/31/2022   HGB 9.3 (L) 12/31/2022   HCT 29.8 (L) 12/31/2022   MCV 74.7 (L) 12/31/2022   PLT 405.0 (H) 12/31/2022     Chemistry      Component Value  Date/Time   NA 136 12/31/2022 1013   K 4.0 12/31/2022 1013   CL 101 12/31/2022 1013   CO2 29 12/31/2022 1013   BUN 14 12/31/2022 1013   CREATININE 0.78 12/31/2022 1013      Component Value Date/Time   CALCIUM 8.8 12/31/2022 1013   ALKPHOS 66 12/31/2022 1013   AST 24 12/31/2022 1013   ALT 13 12/31/2022 1013   BILITOT 0.4 12/31/2022 1013       RADIOGRAPHIC STUDIES: I have personally reviewed the radiological images as listed and agreed with the findings in the report. No results found.  All questions were answered. The patient knows to call the clinic with any problems, questions or concerns. I spent 30 minutes in the care of this patient including H and P, review of records, counseling and coordination of care.     Amber Stalls, MD 03/02/2023 3:53 PM

## 2023-03-03 ENCOUNTER — Telehealth: Payer: Self-pay | Admitting: Hematology and Oncology

## 2023-03-03 LAB — CERVICOVAGINAL ANCILLARY ONLY
Bacterial Vaginitis (gardnerella): POSITIVE — AB
Candida Glabrata: NEGATIVE
Candida Vaginitis: NEGATIVE
Chlamydia: NEGATIVE
Comment: NEGATIVE
Comment: NEGATIVE
Comment: NEGATIVE
Comment: NEGATIVE
Comment: NEGATIVE
Comment: NORMAL
Neisseria Gonorrhea: NEGATIVE
Trichomonas: NEGATIVE

## 2023-03-03 NOTE — Telephone Encounter (Signed)
 Spoke with patient confirming upcoming appointment

## 2023-03-04 ENCOUNTER — Ambulatory Visit
Admission: RE | Admit: 2023-03-04 | Discharge: 2023-03-04 | Disposition: A | Discharge status: Home / Self Care | Payer: BC Managed Care – PPO | Source: Ambulatory Visit | Attending: Family Medicine | Admitting: Family Medicine

## 2023-03-04 DIAGNOSIS — E049 Nontoxic goiter, unspecified: Secondary | ICD-10-CM | POA: Diagnosis not present

## 2023-03-04 DIAGNOSIS — R09A2 Foreign body sensation, throat: Secondary | ICD-10-CM

## 2023-03-04 LAB — URINE CULTURE: Culture: >=100000 — AB

## 2023-03-06 ENCOUNTER — Encounter: Payer: Self-pay | Admitting: Family Medicine

## 2023-03-06 DIAGNOSIS — R09A2 Foreign body sensation, throat: Secondary | ICD-10-CM

## 2023-03-09 ENCOUNTER — Encounter: Payer: Self-pay | Admitting: Gastroenterology

## 2023-04-04 ENCOUNTER — Ambulatory Visit: Payer: BC Managed Care – PPO | Admitting: Family Medicine

## 2023-04-04 NOTE — Progress Notes (Deleted)
   Established Patient Office Visit  Subjective   Patient ID: Crystal Walters, female    DOB: November 26, 1995  Age: 28 y.o. MRN: [id number]  No chief complaint on file.   HPI Patient presents today for medication management. Reports compliance with medication regimen. Denies the need for refills. Not fasting today. Denies other concerns. Medical history as outlined below.  ROS Per HPI    Objective:     There were no vitals taken for this visit.  Physical Exam Vitals and nursing note reviewed.  Constitutional:      Appearance: Normal appearance. She is normal weight.  HENT:     Head: Normocephalic and atraumatic.     Right Ear: Tympanic membrane and ear canal normal.     Left Ear: Tympanic membrane and ear canal normal.     Nose: Nose normal.  Eyes:     Extraocular Movements: Extraocular movements intact.     Pupils: Pupils are equal, round, and reactive to light.  Cardiovascular:     Rate and Rhythm: Normal rate and regular rhythm.     Heart sounds: Normal heart sounds.  Pulmonary:     Effort: Pulmonary effort is normal.     Breath sounds: Normal breath sounds.  Musculoskeletal:        General: Normal range of motion.     Cervical back: Normal range of motion.  Neurological:     General: No focal deficit present.     Mental Status: She is alert and oriented to person, place, and time.  Psychiatric:        Mood and Affect: Mood normal.        Thought Content: Thought content normal.    No results found for any visits on 04/04/23.   The ASCVD Risk score (Arnett DK, et al., 2019) failed to calculate for the following reasons:   The 2019 ASCVD risk score is only valid for ages 29 to 40    Assessment & Plan:   There are no diagnoses linked to this encounter.   No follow-ups on file.    Moshe Cipro, FNP

## 2023-04-13 ENCOUNTER — Encounter: Payer: Self-pay | Admitting: Family Medicine

## 2023-04-13 ENCOUNTER — Ambulatory Visit (INDEPENDENT_AMBULATORY_CARE_PROVIDER_SITE_OTHER): Admitting: Family Medicine

## 2023-04-13 ENCOUNTER — Other Ambulatory Visit (HOSPITAL_COMMUNITY)
Admission: RE | Admit: 2023-04-13 | Discharge: 2023-04-13 | Disposition: A | Discharge status: Home / Self Care | Source: Ambulatory Visit | Attending: Family Medicine | Admitting: Family Medicine

## 2023-04-13 VITALS — BP 136/88 | HR 78 | Temp 97.9°F | Ht 67.32 in | Wt 154.2 lb

## 2023-04-13 DIAGNOSIS — Z124 Encounter for screening for malignant neoplasm of cervix: Secondary | ICD-10-CM

## 2023-04-13 DIAGNOSIS — N760 Acute vaginitis: Secondary | ICD-10-CM | POA: Insufficient documentation

## 2023-04-13 DIAGNOSIS — Z720 Tobacco use: Secondary | ICD-10-CM

## 2023-04-13 DIAGNOSIS — D509 Iron deficiency anemia, unspecified: Secondary | ICD-10-CM

## 2023-04-13 DIAGNOSIS — L739 Follicular disorder, unspecified: Secondary | ICD-10-CM

## 2023-04-13 DIAGNOSIS — R09A2 Foreign body sensation, throat: Secondary | ICD-10-CM | POA: Diagnosis not present

## 2023-04-13 LAB — CBC WITH DIFFERENTIAL/PLATELET
Basophils Absolute: 0 10*3/uL (ref 0.0–0.1)
Basophils Relative: 1 % (ref 0.0–3.0)
Eosinophils Absolute: 0.1 10*3/uL (ref 0.0–0.7)
Eosinophils Relative: 2.4 % (ref 0.0–5.0)
HCT: 40.2 % (ref 36.0–46.0)
Hemoglobin: 13 g/dL (ref 12.0–15.0)
Lymphocytes Relative: 33.3 % (ref 12.0–46.0)
Lymphs Abs: 1.6 10*3/uL (ref 0.7–4.0)
MCHC: 32.4 g/dL (ref 30.0–36.0)
MCV: 90.9 fl (ref 78.0–100.0)
Monocytes Absolute: 0.4 10*3/uL (ref 0.1–1.0)
Monocytes Relative: 9.1 % (ref 3.0–12.0)
Neutro Abs: 2.6 10*3/uL (ref 1.4–7.7)
Neutrophils Relative %: 54.2 % (ref 43.0–77.0)
Platelets: 302 10*3/uL (ref 150.0–400.0)
RBC: 4.42 Mil/uL (ref 3.87–5.11)
RDW: 21.2 % — ABNORMAL HIGH (ref 11.5–15.5)
WBC: 4.8 10*3/uL (ref 4.0–10.5)

## 2023-04-13 LAB — POCT URINALYSIS DIPSTICK
Bilirubin, UA: NEGATIVE
Blood, UA: POSITIVE
Glucose, UA: NEGATIVE
Ketones, UA: NEGATIVE
Leukocytes, UA: NEGATIVE
Nitrite, UA: NEGATIVE
Protein, UA: POSITIVE — AB
Spec Grav, UA: 1.02 (ref 1.010–1.025)
Urobilinogen, UA: 0.2 U/dL
pH, UA: 7 (ref 5.0–8.0)

## 2023-04-13 LAB — POCT URINE PREGNANCY: Preg Test, Ur: NEGATIVE

## 2023-04-13 MED ORDER — NICOTINE POLACRILEX 4 MG MT GUM: 4.0000 mg | CHEWING_GUM | OROMUCOSAL | 0 refills | Status: DC | PRN

## 2023-04-13 MED ORDER — CHLORHEXIDINE GLUCONATE 4 % EX SOLN: Freq: Every day | CUTANEOUS | 1 refills | Status: DC | PRN

## 2023-04-13 NOTE — Progress Notes (Signed)
 Established Patient Office Visit  Subjective   Patient ID: Crystal Walters, female    DOB: 04/25/1995  Age: 28 y.o. MRN: [id number]  Chief Complaint  Patient presents with   Follow-up    3 month f/u, requesting OBGYN referral for pap smear    HPI Patient presents today for reevaluation of iron deficiency anemia. Requesting referral to OB/GYN for recurrent vaginitis. Reports compliance with iron supplementation. States that the cravings for dirt have decreased. Reports current vaginal discharge and irritation, requesting vaginal swab today. Reports folliculitis at the bikini area.  Inquiring about what can be done for that. Reports that she has began to smoke again, inquiring about nicotine patches or gum today. Reports she still having a globus sensation, states this causes anxiety and then she feels like she cannot breathe or swallow well. Not fasting today. Denies other concerns. Medical history as outlined below.  ROS Per HPI    Objective:     BP 136/88 (BP Location: Left Arm, Patient Position: Sitting)   Pulse 78   Temp 97.9 F (36.6 C) (Temporal)   Ht 5' 7.32" (1.71 m)   Wt 154 lb 3.2 oz (69.9 kg)   SpO2 98%   BMI 23.92 kg/m   Physical Exam Vitals and nursing note reviewed.  Constitutional:      General: She is not in acute distress.    Appearance: Normal appearance. She is normal weight.  HENT:     Head: Normocephalic and atraumatic.     Nose: Nose normal.  Eyes:     Extraocular Movements: Extraocular movements intact.  Cardiovascular:     Rate and Rhythm: Normal rate and regular rhythm.     Heart sounds: Normal heart sounds.  Pulmonary:     Effort: Pulmonary effort is normal. No respiratory distress.     Breath sounds: Normal breath sounds. No wheezing, rhonchi or rales.  Musculoskeletal:        General: Normal range of motion.     Cervical back: Normal range of motion.  Neurological:     General: No focal deficit present.     Mental Status: She  is alert and oriented to person, place, and time.  Psychiatric:        Mood and Affect: Mood normal.        Thought Content: Thought content normal.     Results for orders placed or performed in visit on 04/13/23  POCT urinalysis dipstick  Result Value Ref Range   Color, UA dark yellow    Clarity, UA cloudy    Glucose, UA Negative Negative   Bilirubin, UA negative    Ketones, UA negative    Spec Grav, UA 1.020 1.010 - 1.025   Blood, UA Positive    pH, UA 7.0 5.0 - 8.0   Protein, UA Positive (A) Negative   Urobilinogen, UA 0.2 0.2 or 1.0 E.U./dL   Nitrite, UA negative    Leukocytes, UA Negative Negative   Appearance     Odor    POCT urine pregnancy  Result Value Ref Range   Preg Test, Ur Negative Negative     The ASCVD Risk score (Arnett DK, et al., 2019) failed to calculate for the following reasons:   The 2019 ASCVD risk score is only valid for ages 26 to 84    Assessment & Plan:   Cervical cancer screening -     Ambulatory referral to Obstetrics / Gynecology  Iron deficiency anemia, unspecified iron deficiency anemia type -  CBC with Differential/Platelet -     Iron, TIBC and Ferritin Panel  Globus sensation -     Ambulatory referral to Gastroenterology  Acute vaginitis -     Cervicovaginal ancillary only -     POCT urinalysis dipstick -     Urine Culture -     POCT urine pregnancy  Tobacco use -     Nicotine Polacrilex; Take 1 each (4 mg total) by mouth as needed for smoking cessation.  Dispense: 100 tablet; Refill: 0  Folliculitis -     Chlorhexidine Gluconate; Apply topically daily as needed.  Dispense: 118 mL; Refill: 1  Will use Hibiclens to help treat recurrent folliculitis Referral to OB/GYN for recurrent vaginitis and to establish care Nicotine gum sent to help with smoking cessation Referral to GI for further evaluation of globus sensation  Return in about 1 year (around 04/12/2024) for or as needed.    Moshe Cipro, FNP

## 2023-04-13 NOTE — Patient Instructions (Addendum)
 Continue current medication regimen.  Continue to follow up with hematology as scheduled.   I have placed a referral to gynecology for you today. Someone will be reaching out to get you scheduled soon.   I have placed a referral to GI for you today. Someone will be reaching out to get you scheduled soon.   I have sent in Nicorette gum for you to [facility] on [address]  I have sent in   We are checking labs today, will be in contact with any results that require further attention   Follow up with me as needed.

## 2023-04-14 ENCOUNTER — Encounter: Payer: Self-pay | Admitting: Family Medicine

## 2023-04-14 LAB — CERVICOVAGINAL ANCILLARY ONLY
Bacterial Vaginitis (gardnerella): POSITIVE — AB
Candida Glabrata: NEGATIVE
Candida Vaginitis: NEGATIVE
Chlamydia: NEGATIVE
Comment: NEGATIVE
Comment: NEGATIVE
Comment: NEGATIVE
Comment: NEGATIVE
Comment: NEGATIVE
Comment: NORMAL
Neisseria Gonorrhea: NEGATIVE
Trichomonas: NEGATIVE

## 2023-04-15 ENCOUNTER — Other Ambulatory Visit: Payer: Self-pay

## 2023-04-15 DIAGNOSIS — N760 Acute vaginitis: Secondary | ICD-10-CM

## 2023-04-15 LAB — IRON,TIBC AND FERRITIN PANEL
%SAT: 11 % — ABNORMAL LOW (ref 16–45)
Ferritin: 18 ng/mL (ref 16–154)
Iron: 42 ug/dL (ref 40–190)
TIBC: 395 ug/dL (ref 250–450)

## 2023-04-15 LAB — URINE CULTURE

## 2023-04-15 MED ORDER — METRONIDAZOLE 500 MG PO TABS: 500.0000 mg | ORAL_TABLET | Freq: Three times a day (TID) | ORAL | 0 refills | Status: DC

## 2023-04-15 MED ORDER — METRONIDAZOLE 500 MG PO TABS: 500.0000 mg | ORAL_TABLET | Freq: Three times a day (TID) | ORAL | 0 refills | Status: AC

## 2023-04-15 MED ORDER — NITROFURANTOIN MONOHYD MACRO 100 MG PO CAPS
100.0000 mg | ORAL_CAPSULE | Freq: Two times a day (BID) | ORAL | 0 refills | Status: DC
Start: 2023-04-15 — End: 2023-04-15

## 2023-04-15 MED ORDER — FLUCONAZOLE 150 MG PO TABS
150.0000 mg | ORAL_TABLET | Freq: Once | ORAL | 0 refills | Status: DC
Start: 2023-04-15 — End: 2023-04-15

## 2023-04-15 MED ORDER — NITROFURANTOIN MONOHYD MACRO 100 MG PO CAPS: 100.0000 mg | ORAL_CAPSULE | Freq: Two times a day (BID) | ORAL | 0 refills | Status: AC

## 2023-04-15 MED ORDER — FLUCONAZOLE 150 MG PO TABS: 150.0000 mg | ORAL_TABLET | Freq: Once | ORAL | 0 refills | Status: AC

## 2023-04-15 NOTE — Addendum Note (Signed)
 Addended by: Sherald Barge on: 04/15/2023 01:02 PM   Modules accepted: Orders

## 2023-04-15 NOTE — Addendum Note (Signed)
 Addended by: Sherald Barge on: 04/15/2023 10:34 AM   Modules accepted: Orders

## 2023-04-20 NOTE — Progress Notes (Unsigned)
 Chief Complaint:Globus sensation Primary GI Doctor:***  HPI:  Patient is a  28  year old female patient with past medical history of *****who was referred to me by Moshe Cipro, FNP on 04/13/23 for a complaint of globus sensation .    Interval History  Patient admits/denies GERD Patient admits/denies dysphagia Patient admits/denies nausea, vomiting, or weight loss  Patient admits/denies altered bowel habits Patient admits/denies abdominal pain Patient admits/denies rectal bleeding   Denies/Admits alcohol Denies/Admits smoking Denies/Admits NSAID use. Denies/Admits they are on blood thinners.  Patients last colonoscopy Patients last EGD  Patient's family history includes  Wt Readings from Last 3 Encounters:  04/13/23 154 lb 3.2 oz (69.9 kg)  03/02/23 148 lb 14.4 oz (67.5 kg)  02/21/23 146 lb 6.4 oz (66.4 kg)      Past Medical History:  Diagnosis Date   Angio-edema    Chlamydia    Depression    Eczema    Seizures (HCC)    childhood   Vaginal Pap smear, abnormal     Past Surgical History:  Procedure Laterality Date   NO PAST SURGERIES      Current Outpatient Medications  Medication Sig Dispense Refill   chlorhexidine (HIBICLENS) 4 % external liquid Apply topically daily as needed. 118 mL 1   Iron, Ferrous Sulfate, 325 (65 Fe) MG TABS Take 325 mg by mouth daily. 30 tablet 1   metroNIDAZOLE (FLAGYL) 500 MG tablet Take 1 tablet (500 mg total) by mouth 3 (three) times daily for 7 days. 21 tablet 0   nicotine (NICODERM CQ - DOSED IN MG/24 HR) 7 mg/24hr patch Place 1 patch (7 mg total) onto the skin daily. 28 patch 3   nicotine polacrilex (NICORETTE) 4 MG gum Take 1 each (4 mg total) by mouth as needed for smoking cessation. 100 tablet 0   nitrofurantoin, macrocrystal-monohydrate, (MACROBID) 100 MG capsule Take 1 capsule (100 mg total) by mouth 2 (two) times daily for 5 days. 10 capsule 0   No current facility-administered medications for this visit.     Allergies as of 04/21/2023   (No Known Allergies)    Family History  Problem Relation Age of Onset   Eczema Mother    Allergic rhinitis Mother    Hypertension Mother    Allergic rhinitis Father    Asthma Sister    Cervical cancer Paternal Grandmother    Thyroid cancer Paternal Grandmother        medullary; dx >50   Cancer Half-Sister        "ectodermal"; dx 6 months; paternal half isster   Pancreatic cancer Other        PGF's brother (dx 17s) and PGF's mother (dx 83s)   Ovarian cancer Other        PGF's mother (d. 25)   Colon cancer Other        unkown maternal relative   Angioedema Neg Hx    Urticaria Neg Hx     Review of Systems:    Constitutional: No weight loss, fever, chills, weakness or fatigue HEENT: Eyes: No change in vision               Ears, Nose, Throat:  No change in hearing or congestion Skin: No rash or itching Cardiovascular: No chest pain, chest pressure or palpitations   Respiratory: No SOB or cough Gastrointestinal: See HPI and otherwise negative Genitourinary: No dysuria or change in urinary frequency Neurological: No headache, dizziness or syncope Musculoskeletal: No new muscle or joint pain  Hematologic: No bleeding or bruising Psychiatric: No history of depression or anxiety    Physical Exam:  Vital signs: There were no vitals taken for this visit.  Constitutional:   Pleasant *** female appears to be in NAD, Well developed, Well nourished, alert and cooperative Head:  Normocephalic and atraumatic. Eyes:   PEERL, EOMI. No icterus. Conjunctiva pink. Ears:  Normal auditory acuity. Neck:  Supple Throat: Oral cavity and pharynx without inflammation, swelling or lesion.  Respiratory: Respirations even and unlabored. Lungs clear to auscultation bilaterally.   No wheezes, crackles, or rhonchi.  Cardiovascular: Normal S1, S2. Regular rate and rhythm. No peripheral edema, cyanosis or pallor.  Gastrointestinal:  Soft, nondistended, nontender. No  rebound or guarding. Normal bowel sounds. No appreciable masses or hepatomegaly. Rectal:  Not performed.  Anoscopy: Msk:  Symmetrical without gross deformities. Without edema, no deformity or joint abnormality.  Neurologic:  Alert and  oriented x4;  grossly normal neurologically.  Skin:   Dry and intact without significant lesions or rashes. Psychiatric: Oriented to person, place and time. Demonstrates good judgement and reason without abnormal affect or behaviors.  RELEVANT LABS AND IMAGING: CBC    Latest Ref Rng & Units 04/13/2023    9:52 AM 12/31/2022   10:13 AM 05/15/2020    4:40 PM  CBC  WBC 4.0 - 10.5 K/uL 4.8  4.8  6.0   Hemoglobin 12.0 - 15.0 g/dL 16.1  9.3  09.6   Hematocrit 36.0 - 46.0 % 40.2  29.8  46.8   Platelets 150.0 - 400.0 K/uL 302.0  405.0  393      CMP     Latest Ref Rng & Units 12/31/2022   10:13 AM 05/15/2020    4:40 PM 01/10/2019    8:54 PM  CMP  Glucose 70 - 99 mg/dL 92  96  045   BUN 6 - 23 mg/dL 14  15  10    Creatinine 0.40 - 1.20 mg/dL 4.09  8.11  9.14   Sodium 135 - 145 mEq/L 136  131  139   Potassium 3.5 - 5.1 mEq/L 4.0  3.3  4.3   Chloride 96 - 112 mEq/L 101  93  101   CO2 19 - 32 mEq/L 29  27  29    Calcium 8.4 - 10.5 mg/dL 8.8  9.1  9.3   Total Protein 6.0 - 8.3 g/dL 7.8  8.5  7.1   Total Bilirubin 0.2 - 1.2 mg/dL 0.4  1.0  1.0   Alkaline Phos 39 - 117 U/L 66  74  55   AST 0 - 37 U/L 24  29  23    ALT 0 - 35 U/L 13  17  20       Lab Results  Component Value Date   TSH 1.10 12/31/2022   Lab Results  Component Value Date   IRON 42 04/13/2023   TIBC 395 04/13/2023   FERRITIN 18 04/13/2023      03/04/23 US thyroid IMPRESSION: Normal thyroid ultrasound.   Assessment: 1. ***  Plan: 1. ***   Thank you for the courtesy of this consult. Please call me with any questions or concerns.   Amoy Steeves, FNP-C [facility] 04/20/2023, 4:34 PM  Cc: Moshe Cipro, FNP

## 2023-04-21 ENCOUNTER — Ambulatory Visit: Payer: BC Managed Care – PPO | Admitting: Gastroenterology

## 2023-05-20 ENCOUNTER — Encounter: Payer: Self-pay | Admitting: Obstetrics and Gynecology

## 2023-05-20 ENCOUNTER — Other Ambulatory Visit (HOSPITAL_COMMUNITY)
Admission: RE | Admit: 2023-05-20 | Discharge: 2023-05-20 | Disposition: A | Discharge status: Home / Self Care | Source: Ambulatory Visit | Attending: Obstetrics and Gynecology | Admitting: Obstetrics and Gynecology

## 2023-05-20 ENCOUNTER — Ambulatory Visit (INDEPENDENT_AMBULATORY_CARE_PROVIDER_SITE_OTHER): Admitting: Obstetrics and Gynecology

## 2023-05-20 VITALS — BP 104/68 | HR 89 | Temp 98.5°F | Ht 67.25 in | Wt 152.0 lb

## 2023-05-20 DIAGNOSIS — Z3009 Encounter for other general counseling and advice on contraception: Secondary | ICD-10-CM | POA: Insufficient documentation

## 2023-05-20 DIAGNOSIS — N898 Other specified noninflammatory disorders of vagina: Secondary | ICD-10-CM | POA: Diagnosis not present

## 2023-05-20 DIAGNOSIS — Z113 Encounter for screening for infections with a predominantly sexual mode of transmission: Secondary | ICD-10-CM

## 2023-05-20 DIAGNOSIS — Z124 Encounter for screening for malignant neoplasm of cervix: Secondary | ICD-10-CM

## 2023-05-20 DIAGNOSIS — Z1331 Encounter for screening for depression: Secondary | ICD-10-CM | POA: Diagnosis not present

## 2023-05-20 DIAGNOSIS — Z01419 Encounter for gynecological examination (general) (routine) without abnormal findings: Secondary | ICD-10-CM | POA: Insufficient documentation

## 2023-05-20 LAB — WET PREP FOR TRICH, YEAST, CLUE

## 2023-05-20 MED ORDER — NORELGESTROMIN-ETH ESTRADIOL 150-35 MCG/24HR TD PTWK: 1.0000 | MEDICATED_PATCH | TRANSDERMAL | 4 refills | Status: DC

## 2023-05-20 NOTE — Progress Notes (Signed)
 28 y.o. G37P2002 female with IDA, hx of HPV here for annual exam. Single. [facility] at [facility].  Patient's last menstrual period was 05/18/2023 (approximate). Period Duration (Days): 4 Period Pattern: Regular Menstrual Flow: Moderate Menstrual Control: Maxi pad Dysmenorrhea: (!) Mild Dysmenorrhea Symptoms: Nausea  No recent GYN care Vaginal discharge and odor, history of BV x3 over past year Last Hgb 13.0  Abnormal bleeding: none Pelvic discharge or pain: none Breast mass, nipple discharge or skin changes : none Birth control: none Last PAP: No results found for: "DIAGPAP", "HPVHIGH", "ADEQPAP" Gardasil: unsure Sexually active: yes, 1 female partner, nonmonogamous  Exercising: no Smoker: yes, working on quitting, restarted nicotine  replacement today  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 [facility]  PHQ-2 Total Score 0        GYN HISTORY: No sig hx  OB History  Gravida Para Term Preterm AB Living  2 2 2   2   SAB IAB Ectopic Multiple Live Births     0 2    # Outcome Date GA Lbr Len/2nd Weight Sex Type Anes PTL Lv  2 Term 10/21/18 [redacted]w[redacted]d 05:58 / 00:11 8 lb 5 oz (3.771 kg) M Vag-Spont EPI  LIV  1 Term 10/08/14 [redacted]w[redacted]d 03:38 / 00:14 7 lb 5.5 oz (3.33 kg) M Vag-Spont EPI  LIV    Past Medical History:  Diagnosis Date   Anemia    Angio-edema    Chlamydia    Depression    Eczema    Seizures (HCC)    childhood   Vaginal Pap smear, abnormal     Past Surgical History:  Procedure Laterality Date   NO PAST SURGERIES      Current Outpatient Medications on File Prior to Visit  Medication Sig Dispense Refill   Iron , Ferrous Sulfate , 325 (65 Fe) MG TABS Take 325 mg by mouth daily. 30 tablet 1   nicotine  (NICODERM CQ  - DOSED IN MG/24 HR) 7 mg/24hr patch Place 1 patch (7 mg total) onto the skin daily. 28 patch 3   nicotine  polacrilex (NICORETTE ) 4 MG gum Take 1 each (4 mg total) by mouth as needed for smoking cessation. 100 tablet  0   [DISCONTINUED] omeprazole  (PRILOSEC) 20 MG capsule Take 1 capsule (20 mg total) by mouth daily. 30 capsule 0   No current facility-administered medications on file prior to visit.    Social History   Socioeconomic History   Marital status: Single    Spouse name: Not on file   Number of children: Not on file   Years of education: Not on file   Highest education level: Not on file  Occupational History   Not on file  Tobacco Use   Smoking status: Every Day    Types: Cigars    Passive exposure: Never   Smokeless tobacco: Never   Tobacco comments:    Black & Milds  Vaping Use   Vaping status: Never Used  Substance and Sexual Activity   Alcohol use: Yes    Comment: occasionally   Drug use: Yes    Types: Marijuana    Comment: occasionally   Sexual activity: Yes    Comment: occasionally  Other Topics Concern   Not on file  Social History Narrative   Not on file   Social Drivers of Health   Financial Resource Strain: Low Risk  (10/17/2018)   Overall Financial Resource Strain (CARDIA)    Difficulty of Paying Living Expenses: Not hard at all  Food Insecurity: No Food Insecurity (10/17/2018)   Hunger Vital Sign    Worried About Running Out of Food in the Last Year: Never true    Ran Out of Food in the Last Year: Never true  Transportation Needs: No Transportation Needs (10/17/2018)   PRAPARE - Administrator, Civil Service (Medical): No    Lack of Transportation (Non-Medical): No  Physical Activity: Not on file  Stress: Not on file  Social Connections: Unknown (06/09/2021)   Received from [facility]   Social Network    Social Network: Not on file  Intimate Partner Violence: Unknown (05/01/2021)   Received from [facility]   HITS    Physically Hurt: Not on file    Insult or Talk Down To: Not on file    Threaten Physical Harm: Not on file    Scream or Curse: Not on file    Family History  Problem Relation Age of Onset    Eczema Mother    Allergic rhinitis Mother    Hypertension Mother    Anemia Mother    Allergic rhinitis Father    Asthma Sister    Breast cancer Paternal Aunt    Cervical cancer Paternal Grandmother    Thyroid  cancer Paternal Grandmother        medullary; dx >50   Cancer Half-Sister        "ectodermal"; dx 6 months; paternal half isster   Pancreatic cancer Other        PGF's brother (dx 51s) and PGF's mother (dx 16s)   Ovarian cancer Other        PGF's mother (d. 1)   Colon cancer Other        unkown maternal relative   Angioedema Neg Hx    Urticaria Neg Hx     No Known Allergies    PE Today's Vitals   05/20/23 1044  BP: 104/68  Pulse: 89  Temp: 98.5 F (36.9 C)  [facility]Src: Oral  SpO2: 98%  Weight: 152 lb (68.9 kg)  Height: 5' 7.25" (1.708 m)   Body mass index is 23.63 kg/m.  Physical Exam Vitals reviewed. Exam conducted with a chaperone present.  Constitutional:      General: She is not in acute distress.    Appearance: Normal appearance.  HENT:     Head: Normocephalic and atraumatic.     Nose: Nose normal.  Eyes:     Extraocular Movements: Extraocular movements intact.     Conjunctiva/sclera: Conjunctivae normal.  Neck:     Thyroid : No thyroid  mass, thyromegaly or thyroid  tenderness.  Pulmonary:     Effort: Pulmonary effort is normal.  Chest:     Chest wall: No mass or tenderness.  Breasts:    Right: Normal. No swelling, mass, nipple discharge, skin change or tenderness.     Left: Normal. No swelling, mass, nipple discharge, skin change or tenderness.  Abdominal:     General: There is no distension.     Palpations: Abdomen is soft.     Tenderness: There is no abdominal tenderness.  Genitourinary:    General: Normal vulva.     Exam position: Lithotomy position.     Urethra: No prolapse.     Vagina: Normal. No vaginal discharge or bleeding.     Cervix: Normal. No lesion.     Uterus: Normal. Not enlarged and not tender.      Adnexa: Right adnexa  normal and left adnexa normal.  Comments: Heavy menses present Musculoskeletal:        General: Normal range of motion.     Cervical back: Normal range of motion.  Lymphadenopathy:     Upper Body:     Right upper body: No axillary adenopathy.     Left upper body: No axillary adenopathy.     Lower Body: No right inguinal adenopathy. No left inguinal adenopathy.  Skin:    General: Skin is warm and dry.  Neurological:     General: No focal deficit present.     Mental Status: She is alert.  Psychiatric:        Mood and Affect: Mood normal.        Behavior: Behavior normal.       Assessment and Plan:        Well woman exam with routine gynecological exam Assessment & Plan: Cervical cancer screening performed according to ASCCP guidelines. Labs and immunizations with her primary Encouraged safe sexual practices as indicated Encouraged healthy lifestyle practices with diet and exercise For patients under 50yo, I recommend 1000mg  calcium daily and 600IU of vitamin D daily.    Screen for STD (sexually transmitted disease) -     Cytology - PAP -     Hepatitis B surface antigen -     Hepatitis C antibody -     RPR -     HIV Antibody (routine testing w rflx)  Cervical cancer screening -     Cytology - PAP  Encounter for counseling regarding contraception Assessment & Plan: Contraceptive options were reviewed, including pills, patches, vaginal rings, injection, implant, IUDs and sterilization as indicated. R/b/I/c were reviewed. All questions were answered. Interested in patch Contraindications,: smoking however <35yo Start after current cycle.   Orders: -     Norelgestromin-Eth Estradiol; Place 1 patch onto the skin once a week.  Dispense: 12 patch; Refill: 4  Vaginal discharge -     WET PREP FOR TRICH, YEAST, CLUE  Negative depression screening  Consider BV suppression with any positive test  Romaine Closs, MD

## 2023-05-20 NOTE — Assessment & Plan Note (Signed)
 Contraceptive options were reviewed, including pills, patches, vaginal rings, injection, implant, IUDs and sterilization as indicated. R/b/I/c were reviewed. All questions were answered. Interested in patch Contraindications,: smoking however <30yo Start after current cycle.

## 2023-05-20 NOTE — Assessment & Plan Note (Signed)
 Cervical cancer screening performed according to ASCCP guidelines. Labs and immunizations with her primary Encouraged safe sexual practices as indicated Encouraged healthy lifestyle practices with diet and exercise For patients under 30yo, I recommend 1000mg  calcium daily and 600IU of vitamin D daily.

## 2023-05-20 NOTE — Patient Instructions (Signed)

## 2023-05-21 LAB — HEPATITIS B SURFACE ANTIGEN: Hepatitis B Surface Ag: NONREACTIVE

## 2023-05-21 LAB — RPR: RPR Ser Ql: NONREACTIVE

## 2023-05-21 LAB — HEPATITIS C ANTIBODY: Hepatitis C Ab: NONREACTIVE

## 2023-05-21 LAB — HIV ANTIBODY (ROUTINE TESTING W REFLEX): HIV 1&2 Ab, 4th Generation: NONREACTIVE

## 2023-05-23 ENCOUNTER — Encounter: Payer: Self-pay | Admitting: Obstetrics and Gynecology

## 2023-05-25 ENCOUNTER — Encounter: Admitting: Radiology

## 2023-05-27 LAB — CYTOLOGY - PAP
Chlamydia: NEGATIVE
Comment: NEGATIVE
Comment: NEGATIVE
Comment: NEGATIVE
Comment: NORMAL
Diagnosis: NEGATIVE
High risk HPV: NEGATIVE
Neisseria Gonorrhea: NEGATIVE
Trichomonas: NEGATIVE

## 2023-05-30 ENCOUNTER — Encounter: Payer: Self-pay | Admitting: Obstetrics and Gynecology

## 2023-06-15 ENCOUNTER — Encounter: Payer: Self-pay | Admitting: Gastroenterology

## 2023-06-15 ENCOUNTER — Ambulatory Visit (INDEPENDENT_AMBULATORY_CARE_PROVIDER_SITE_OTHER): Admitting: Gastroenterology

## 2023-06-15 VITALS — BP 100/62 | HR 99 | Ht 66.25 in | Wt 149.5 lb

## 2023-06-15 DIAGNOSIS — R0989 Other specified symptoms and signs involving the circulatory and respiratory systems: Secondary | ICD-10-CM

## 2023-06-15 NOTE — Progress Notes (Signed)
 I agree with the assessment and plan as outlined by Ms. May.

## 2023-06-15 NOTE — Patient Instructions (Addendum)
 Stop marajuana and cigar use If no improvement in symptoms or start having issues swallowing please contact our office and schedule appointment   _______________________________________________________  If your blood pressure at your visit was 140/90 or greater, please contact your primary care physician to follow up on this.  _______________________________________________________  If you are age 30 or older, your body mass index should be between 23-30. Your Body mass index is 23.95 kg/m. If this is out of the aforementioned range listed, please consider follow up with your Primary Care Provider.  If you are age 46 or younger, your body mass index should be between 19-25. Your Body mass index is 23.95 kg/m. If this is out of the aformentioned range listed, please consider follow up with your Primary Care Provider.   ________________________________________________________  The [place] [facility] providers would like to encourage you to use MYCHART to communicate with providers for non-urgent requests or questions.  Due to long hold times on the telephone, sending your provider a message by [facility] may be a faster and more efficient way to get a response.  Please allow 48 business hours for a response.  Please remember that this is for non-urgent requests.  _______________________________________________________ Thank you for trusting me with your gastrointestinal care. Deanna May, RNP

## 2023-06-15 NOTE — Progress Notes (Signed)
 Chief Complaint:globus sensation Primary GI Doctor: Dr. Rosaline Coma  HPI:  Patient is a  28  year old female patient with past medical history of depression, eczema, and seizures, who was referred to me by Wellington Half, * on 04/13/23 for a complaint ofglobus sensation.  Interval History    Patient presents for main complaint of  what she describes as her "throat expanding/swollen" when she smokes marajuana and cigears. She states periods where she doesn't smoke she does not have the issue. She has intermittent dry cough. She denies GERD or dysphagia.  She orts history of nausea and vomiting however not current issue.  It has been over 3 years since she had episode. She states her appetite has recently decreased, but states she has toothache/infection that needs to be addressed  by dentist along with she states smoking also decreases her appetite.  Patient denies altered bowel habits, abdominal pain, or rectal bleeding.  No black tarry stool.   She states she smoked right before appointment just so I could examine her neck because she feels it is swollen.   US  thyroid  recently ordered and negative.  She drinks socially on weekends, 1 glass of wine.  She smokes marajuana daily. She smokes 5-6 cigars daily. Patient on nicotine  patch and gum.   She reports last seizure was when she was a child. No recent episode.   Patient is on iron  supplement for iron  deficiency. She has heavy menstrual cycles 1-2 days heavy flow, lasts 4 days.   Patient's family history includes Aunt and uncle with Pancreatic CA on fathers side.  Wt Readings from Last 3 Encounters:  06/15/23 149 lb 8 oz (67.8 kg)  05/20/23 152 lb (68.9 kg)  04/13/23 154 lb 3.2 oz (69.9 kg)     Past Medical History:  Diagnosis Date   Anemia    Angio-edema    Chlamydia    Depression    Eczema    Seizures (HCC)    childhood   Vaginal Pap smear, abnormal     Past Surgical History:  Procedure Laterality Date   NO PAST  SURGERIES      Current Outpatient Medications  Medication Sig Dispense Refill   cyclobenzaprine (FLEXERIL) 10 MG tablet Take 10 mg by mouth at bedtime as needed.     Iron , Ferrous Sulfate , 325 (65 Fe) MG TABS Take 325 mg by mouth daily. 30 tablet 1   nicotine  polacrilex (NICORETTE ) 4 MG gum Take 1 each (4 mg total) by mouth as needed for smoking cessation. 100 tablet 0   norelgestromin -ethinyl estradiol  (XULANE) 150-35 MCG/24HR transdermal patch Place 1 patch onto the skin once a week. 12 patch 4   meloxicam (MOBIC) 15 MG tablet Take 15 mg by mouth daily. (Patient not taking: Reported on 06/15/2023)     nicotine  (NICODERM CQ  - DOSED IN MG/24 HR) 7 mg/24hr patch Place 1 patch (7 mg total) onto the skin daily. (Patient not taking: Reported on 06/15/2023) 28 patch 3   No current facility-administered medications for this visit.    Allergies as of 06/15/2023 - Review Complete 06/15/2023  Allergen Reaction Noted   Marijuana (cannabis sativa)  06/15/2023    Family History  Problem Relation Age of Onset   Eczema Mother    Allergic rhinitis Mother    Hypertension Mother    Anemia Mother    Allergic rhinitis Father    Cervical cancer Paternal Grandmother    Thyroid  cancer Paternal Grandmother  medullary; dx >50   Breast cancer Paternal Aunt    Asthma Half-Sister    Cancer Half-Sister        "ectodermal"; dx 6 months; paternal half isster   Pancreatic cancer Other        PGF's brother (dx 37s) and PGF's mother (dx 66s)   Ovarian cancer Other        PGF's mother (d. 87)   Colon cancer Other        unkown maternal relative   Angioedema Neg Hx    Urticaria Neg Hx     Review of Systems:    Constitutional: No weight loss, fever, chills, weakness or fatigue HEENT: Eyes: No change in vision               Ears, Nose, Throat:  No change in hearing or congestion Skin: No rash or itching Cardiovascular: No chest pain, chest pressure or palpitations   Respiratory: No SOB or  cough Gastrointestinal: See HPI and otherwise negative Genitourinary: No dysuria or change in urinary frequency Neurological: No headache, dizziness or syncope Musculoskeletal: No new muscle or joint pain Hematologic: No bleeding or bruising Psychiatric: No history of depression or anxiety    Physical Exam:  Vital signs: BP 100/62 (BP Location: Left Arm, Patient Position: Sitting, Cuff Size: Normal)   Pulse 99   Ht 5' 6.25" (1.683 m) Comment: height measured without shoes  Wt 149 lb 8 oz (67.8 kg)   LMP 05/18/2023 (Approximate)   BMI 23.95 kg/m   Constitutional:   Pleasant  female appears to be in NAD, Well developed, Well nourished, alert and cooperative Throat: Oral cavity and pharynx without inflammation, swelling or lesion.  Respiratory: Respirations even and unlabored. Lungs clear to auscultation bilaterally.   No wheezes, crackles, or rhonchi.  Cardiovascular: Normal S1, S2. Regular rate and rhythm. No peripheral edema, cyanosis or pallor.  Gastrointestinal:  Soft, nondistended, nontender. No rebound or guarding. Normal bowel sounds. No appreciable masses or hepatomegaly. Rectal:  Not performed.  Msk:  Symmetrical without gross deformities. Without edema, no deformity or joint abnormality.  Neurologic:  Alert and  oriented x4;  grossly normal neurologically.  Skin:   Dry and intact without significant lesions or rashes. Psychiatric: Oriented to person, place and time. Demonstrates good judgement and reason without abnormal affect or behaviors.  RELEVANT LABS AND IMAGING: CBC    Latest Ref Rng & Units 04/13/2023    9:52 AM 12/31/2022   10:13 AM 05/15/2020    4:40 PM  CBC  WBC 4.0 - 10.5 K/uL 4.8  4.8  6.0   Hemoglobin 12.0 - 15.0 g/dL 16.1  9.3  09.6   Hematocrit 36.0 - 46.0 % 40.2  29.8  46.8   Platelets 150.0 - 400.0 K/uL 302.0  405.0  393      CMP     Latest Ref Rng & Units 12/31/2022   10:13 AM 05/15/2020    4:40 PM 01/10/2019    8:54 PM  CMP  Glucose 70 - 99  mg/dL 92  96  045   BUN 6 - 23 mg/dL 14  15  10    Creatinine 0.40 - 1.20 mg/dL 4.09  8.11  9.14   Sodium 135 - 145 mEq/L 136  131  139   Potassium 3.5 - 5.1 mEq/L 4.0  3.3  4.3   Chloride 96 - 112 mEq/L 101  93  101   CO2 19 - 32 mEq/L 29  27  29    Calcium  8.4 - 10.5 mg/dL 8.8  9.1  9.3   Total Protein 6.0 - 8.3 g/dL 7.8  8.5  7.1   Total Bilirubin 0.2 - 1.2 mg/dL 0.4  1.0  1.0   Alkaline Phos 39 - 117 U/L 66  74  55   AST 0 - 37 U/L 24  29  23    ALT 0 - 35 U/L 13  17  20       Lab Results  Component Value Date   TSH 1.10 12/31/2022   03/04/23 US  thyroid  IMPRESSION: Normal thyroid  ultrasound.  Assessment: Encounter Diagnosis  Name Primary?   Throat tightness Yes     28 year old female patient that presents for throat tightness/swollen when she smokes marijuana or cigars.  The symptoms are not present when she is not smoking.  No signs or symptoms of GERD.  No dysphagia.  I suspect her symptoms are related to cannabis use and recommend she stop completely.  At that time we can reevaluate her symptoms and if they continue we can consider other testing, but at this time I do not think a endoscopic procedure is warranted. Neg neck U/S is reassuring. Weight stable. No GI issues at this time. Plan: -stop marajuana and cigar use  -RTC if symptoms persist despite stopping cigars and marajuana   Thank you for the courtesy of this consult. Please call me with any questions or concerns.   Ronn Smolinsky, FNP-C [facility] 06/15/2023, 10:01 AM  Cc: Wellington Half, *

## 2023-06-22 ENCOUNTER — Ambulatory Visit: Admitting: Obstetrics and Gynecology

## 2023-06-24 ENCOUNTER — Ambulatory Visit: Admitting: Nurse Practitioner

## 2023-07-04 ENCOUNTER — Ambulatory Visit: Admitting: Nurse Practitioner

## 2023-07-05 ENCOUNTER — Ambulatory Visit: Admitting: Nurse Practitioner

## 2023-07-11 ENCOUNTER — Ambulatory Visit: Admitting: Obstetrics and Gynecology

## 2023-08-03 ENCOUNTER — Ambulatory Visit: Admitting: Nurse Practitioner

## 2023-08-08 ENCOUNTER — Ambulatory Visit: Admitting: Nurse Practitioner

## 2023-08-22 ENCOUNTER — Telehealth: Payer: Self-pay | Admitting: Family Medicine

## 2023-08-22 ENCOUNTER — Telehealth: Payer: Self-pay | Admitting: *Deleted

## 2023-08-22 NOTE — Telephone Encounter (Unsigned)
 Copied from CRM [id number]. Topic: Appointments - Scheduling Inquiry for Clinic >> Aug 22, 2023  1:20 PM [place] M wrote: Reason for CRM: Patient wanting to schedule for a mammogram, she said she doesn't want anyone telling her that she is too young, she wants to get it done as soon as possible.SABRASABRA

## 2023-08-22 NOTE — Telephone Encounter (Signed)
 Late entry: Patient left message on triage line 08/19/23 at 1556, requesting return call for MMG.   Call returned to patient, Left message to call [facility] Triage at [phone], option 4.

## 2023-08-23 NOTE — Telephone Encounter (Signed)
 Patient notified via MyChart, she reached out to [facility] already for this request

## 2023-08-29 ENCOUNTER — Telehealth: Payer: Self-pay | Admitting: *Deleted

## 2023-08-29 NOTE — Telephone Encounter (Signed)
 Patient left message on triage line stating missed menses, requesting OV.   Call returned to patient, left detailed message, ok per dpr. Advised office phones go off at 1615, will attempt to contact you again on 8/5. ER precautions provided for afterhours: severe pain and/or heavy bleeding. Return call to  [phone], opt 4.

## 2023-08-30 NOTE — Telephone Encounter (Signed)
 See telephone encounter dated 08/29/23.   Encounter closed.

## 2023-08-30 NOTE — Telephone Encounter (Signed)
 Spoke with patient.   UPT negative.  LMP 08/01/23 Stopped Xulane patch approximately 1 month ago.  Unprotected intercourse on 7/17, took Plan B on 7/17.  Reports nausea, no vomiting.  Cramping in lower back.  States menses have been regular prior to this.  Patient had called office on 7/28 requesting MMG order, no return call. Reports breast feel different, wanted a MMG order, states this was prior to missing her period.   Offered OV today, patient declined. States she lives in [place] and it is raining. Patient request to schedule tomorrow. Advised no available appt tomorrow. Then Dr. Dallie is out of the office. Patient request to schedule with another provider if no availability 8/6.   Advised I will review with Dr. Dallie and f/u with recommendations.

## 2023-08-30 NOTE — Telephone Encounter (Signed)
 Left message to call [facility] Triage at [phone], option 4.

## 2023-08-30 NOTE — Telephone Encounter (Signed)
 Spoke with patient, advised per Dr. Dallie.   OV scheduled for 8/6 at 1100 with Tiffany.   Routing to provider for final review. Patient is agreeable to disposition. Will close encounter.  Cc: Annabella, NP

## 2023-08-31 ENCOUNTER — Inpatient Hospital Stay: Payer: BC Managed Care – PPO

## 2023-08-31 ENCOUNTER — Ambulatory Visit: Admitting: Nurse Practitioner

## 2023-08-31 ENCOUNTER — Inpatient Hospital Stay: Payer: BC Managed Care – PPO | Admitting: Adult Health

## 2023-09-07 ENCOUNTER — Ambulatory Visit: Admitting: Nurse Practitioner

## 2023-09-15 ENCOUNTER — Other Ambulatory Visit: Payer: Self-pay

## 2023-09-15 DIAGNOSIS — D500 Iron deficiency anemia secondary to blood loss (chronic): Secondary | ICD-10-CM

## 2023-09-16 ENCOUNTER — Ambulatory Visit: Admitting: Adult Health

## 2023-09-16 ENCOUNTER — Inpatient Hospital Stay

## 2023-09-23 ENCOUNTER — Inpatient Hospital Stay: Attending: Adult Health

## 2023-09-23 ENCOUNTER — Inpatient Hospital Stay (HOSPITAL_BASED_OUTPATIENT_CLINIC_OR_DEPARTMENT_OTHER): Admitting: Adult Health

## 2023-09-23 ENCOUNTER — Ambulatory Visit: Payer: Self-pay

## 2023-09-23 ENCOUNTER — Encounter: Payer: Self-pay | Admitting: Adult Health

## 2023-09-23 VITALS — BP 130/95 | HR 76 | Temp 98.0°F | Resp 18 | Ht 66.2 in | Wt 147.0 lb

## 2023-09-23 DIAGNOSIS — F17290 Nicotine dependence, other tobacco product, uncomplicated: Secondary | ICD-10-CM | POA: Diagnosis not present

## 2023-09-23 DIAGNOSIS — Z800 Family history of malignant neoplasm of digestive organs: Secondary | ICD-10-CM | POA: Insufficient documentation

## 2023-09-23 DIAGNOSIS — Z8041 Family history of malignant neoplasm of ovary: Secondary | ICD-10-CM | POA: Diagnosis not present

## 2023-09-23 DIAGNOSIS — Z808 Family history of malignant neoplasm of other organs or systems: Secondary | ICD-10-CM | POA: Insufficient documentation

## 2023-09-23 DIAGNOSIS — D509 Iron deficiency anemia, unspecified: Secondary | ICD-10-CM

## 2023-09-23 DIAGNOSIS — Z8049 Family history of malignant neoplasm of other genital organs: Secondary | ICD-10-CM | POA: Insufficient documentation

## 2023-09-23 DIAGNOSIS — N920 Excessive and frequent menstruation with regular cycle: Secondary | ICD-10-CM | POA: Diagnosis not present

## 2023-09-23 DIAGNOSIS — D500 Iron deficiency anemia secondary to blood loss (chronic): Secondary | ICD-10-CM

## 2023-09-23 DIAGNOSIS — Z803 Family history of malignant neoplasm of breast: Secondary | ICD-10-CM | POA: Insufficient documentation

## 2023-09-23 LAB — CBC WITH DIFFERENTIAL (CANCER CENTER ONLY)
Abs Immature Granulocytes: 0.01 K/uL (ref 0.00–0.07)
Basophils Absolute: 0 K/uL (ref 0.0–0.1)
Basophils Relative: 1 %
Eosinophils Absolute: 0.1 K/uL (ref 0.0–0.5)
Eosinophils Relative: 2 %
HCT: 40.6 % (ref 36.0–46.0)
Hemoglobin: 13.6 g/dL (ref 12.0–15.0)
Immature Granulocytes: 0 %
Lymphocytes Relative: 31 %
Lymphs Abs: 1.9 K/uL (ref 0.7–4.0)
MCH: 31.9 pg (ref 26.0–34.0)
MCHC: 33.5 g/dL (ref 30.0–36.0)
MCV: 95.1 fL (ref 80.0–100.0)
Monocytes Absolute: 0.7 K/uL (ref 0.1–1.0)
Monocytes Relative: 11 %
Neutro Abs: 3.4 K/uL (ref 1.7–7.7)
Neutrophils Relative %: 55 %
Platelet Count: 290 K/uL (ref 150–400)
RBC: 4.27 MIL/uL (ref 3.87–5.11)
RDW: 12.4 % (ref 11.5–15.5)
WBC Count: 6.1 K/uL (ref 4.0–10.5)
nRBC: 0 % (ref 0.0–0.2)

## 2023-09-23 LAB — FERRITIN: Ferritin: 16 ng/mL (ref 11–307)

## 2023-09-23 LAB — CMP (CANCER CENTER ONLY)
ALT: 10 U/L (ref 0–44)
AST: 19 U/L (ref 15–41)
Albumin: 4.4 g/dL (ref 3.5–5.0)
Alkaline Phosphatase: 62 U/L (ref 38–126)
Anion gap: 10 (ref 5–15)
BUN: 10 mg/dL (ref 6–20)
CO2: 26 mmol/L (ref 22–32)
Calcium: 9 mg/dL (ref 8.9–10.3)
Chloride: 103 mmol/L (ref 98–111)
Creatinine: 0.82 mg/dL (ref 0.44–1.00)
GFR, Estimated: >60 mL/min (ref >60)
Glucose, Bld: 99 mg/dL (ref 70–99)
Potassium: 3.6 mmol/L (ref 3.5–5.1)
Sodium: 139 mmol/L (ref 135–145)
Total Bilirubin: 0.4 mg/dL (ref 0.0–1.2)
Total Protein: 7.8 g/dL (ref 6.5–8.1)

## 2023-09-23 LAB — IRON AND IRON BINDING CAPACITY (CC-WL,HP ONLY)
Iron: 52 ug/dL (ref 28–170)
Saturation Ratios: 13 % (ref 10.4–31.8)
TIBC: 406 ug/dL (ref 250–450)
UIBC: 354 ug/dL (ref 148–442)

## 2023-09-23 NOTE — Telephone Encounter (Addendum)
 Called pt per message below. Pt verbalized understanding and agreed to continue taking the oral iron .----- Message from Morna JAYSON Kendall sent at 09/23/2023  2:17 PM EDT ----- Please let her know her iron  stores are stable and she should continue the oral iron  ----- Message ----- From: Rebecka, Lab In [place] Sent: 09/23/2023  10:41 AM EDT To: Morna Dalton Kendall, NP

## 2023-09-23 NOTE — Progress Notes (Signed)
 [facility] Cancer Follow up:    Crystal Corean CROME, FNP [address] [place] [address]   DIAGNOSIS: iron  deficiency anemia   SUMMARY OF HEMATOLOGIC HISTORY:   Evaluated by Dr. Loretha 03/02/2023 for her iron  deficiency anemia and on oral iron  supplementation.  She was recommended to continue oral iron  supplementaiton and declined IV iron  therapy.    CURRENT THERAPY: oral iron   INTERVAL HISTORY:  Discussed the use of AI scribe software for clinical note transcription with the patient, who gave verbal consent to proceed.  History of Present Illness Crystal Walters is a 28 year old female with iron  deficiency anemia who presents for follow-up and evaluation.  She experiences pica with an urge to eat dirt, which worsens with inconsistent iron  supplementation. She takes iron  fluorosulfate 325 mg daily but occasionally misses doses, keeping the medication in her purse and taking it with food to improve adherence.  Her menstrual cycles are regular but have become heavier, necessitating more pads. She has not tracked the flow or amount. Her mother had similar symptoms and was diagnosed with anemia.  Recent lab tests show a hemoglobin level of 13.6 g/dL. She is awaiting ferritin levels.     Patient Active Problem List   Diagnosis Date Noted   Well woman exam with routine gynecological exam 05/20/2023   Encounter for counseling regarding contraception 05/20/2023   IDA (iron  deficiency anemia) 03/02/2023   Genetic testing 12/16/2022   Indication for care in labor or delivery 10/21/2018    is allergic to marijuana (cannabis sativa).  MEDICAL HISTORY: Past Medical History:  Diagnosis Date   Anemia    Angio-edema    Chlamydia    Depression    Eczema    Seizures (HCC)    childhood   Vaginal Pap smear, abnormal     SURGICAL HISTORY: Past Surgical History:  Procedure Laterality Date   NO PAST SURGERIES      SOCIAL HISTORY: Social History    Socioeconomic History   Marital status: Single    Spouse name: Not on file   Number of children: 2   Years of education: Not on file   Highest education level: Not on file  Occupational History   Not on file  Tobacco Use   Smoking status: Every Day    Types: Cigars    Passive exposure: Never   Smokeless tobacco: Never   Tobacco comments:    Black & Milds  Vaping Use   Vaping status: Never Used  Substance and Sexual Activity   Alcohol use: Yes    Comment: occasionally-2-3 per weekends   Drug use: Yes    Types: Marijuana    Comment: occasionally   Sexual activity: Yes    Comment: occasionally  Other Topics Concern   Not on file  Social History Narrative   Not on file   Social Drivers of Health   Financial Resource Strain: Low Risk  (10/17/2018)   Overall Financial Resource Strain (CARDIA)    Difficulty of Paying Living Expenses: Not hard at all  Food Insecurity: No Food Insecurity (10/17/2018)   Hunger Vital Sign    Worried About Running Out of Food in the Last Year: Never true    Ran Out of Food in the Last Year: Never true  Transportation Needs: No Transportation Needs (10/17/2018)   PRAPARE - Administrator, Civil Service (Medical): No    Lack of Transportation (Non-Medical): No  Physical Activity: Not on file  Stress: Not on file  Social Connections: Unknown (06/09/2021)   Received from [facility]   Social Network    Social Network: Not on file  Intimate Partner Violence: Unknown (05/01/2021)   Received from [facility]   HITS    Physically Hurt: Not on file    Insult or Talk Down To: Not on file    Threaten Physical Harm: Not on file    Scream or Curse: Not on file    FAMILY HISTORY: Family History  Problem Relation Age of Onset   Eczema Mother    Allergic rhinitis Mother    Hypertension Mother    Anemia Mother    Allergic rhinitis Father    Cervical cancer Paternal Grandmother    Thyroid  cancer Paternal Grandmother         medullary; dx >50   Breast cancer Paternal Aunt    Asthma Half-Sister    Cancer Half-Sister        ectodermal; dx 6 months; paternal half isster   Pancreatic cancer Other        PGF's brother (dx 69s) and PGF's mother (dx 56s)   Ovarian cancer Other        PGF's mother (d. 33)   Colon cancer Other        unkown maternal relative   Angioedema Neg Hx    Urticaria Neg Hx     Review of Systems  Constitutional:  Negative for appetite change, chills, fatigue, fever and unexpected weight change.  HENT:   Negative for hearing loss, lump/mass and trouble swallowing.   Eyes:  Negative for eye problems and icterus.  Respiratory:  Negative for chest tightness, cough and shortness of breath.   Cardiovascular:  Negative for chest pain, leg swelling and palpitations.  Gastrointestinal:  Negative for abdominal distention, abdominal pain, constipation, diarrhea, nausea and vomiting.  Endocrine: Negative for hot flashes.  Genitourinary:  Negative for difficulty urinating.   Musculoskeletal:  Negative for arthralgias.  Skin:  Negative for itching and rash.  Neurological:  Negative for dizziness, extremity weakness, headaches and numbness.  Hematological:  Negative for adenopathy. Does not bruise/bleed easily.  Psychiatric/Behavioral:  Negative for depression. The patient is not nervous/anxious.       PHYSICAL EXAMINATION    Vitals:   09/23/23 1105  BP: (!) 130/95  Pulse: 76  Resp: 18  Temp: 98 F (36.7 C)  SpO2: 100%    Physical Exam Constitutional:      General: She is not in acute distress.    Appearance: She is not toxic-appearing.  HENT:     Head: Normocephalic and atraumatic.  Eyes:     General: No scleral icterus. Musculoskeletal:        General: No swelling.  Skin:    General: Skin is warm and dry.     Findings: No rash.  Neurological:     General: No focal deficit present.     Mental Status: She is alert.  Psychiatric:        Mood and Affect: Mood normal.         Behavior: Behavior normal.     LABORATORY DATA:  CBC    Component Value Date/Time   WBC 6.1 09/23/2023 1012   WBC 4.8 04/13/2023 0952   RBC 4.27 09/23/2023 1012   HGB 13.6 09/23/2023 1012   HCT 40.6 09/23/2023 1012   PLT 290 09/23/2023 1012   MCV 95.1 09/23/2023 1012   MCH 31.9 09/23/2023 1012   MCHC 33.5 09/23/2023 1012  RDW 12.4 09/23/2023 1012   LYMPHSABS 1.9 09/23/2023 1012   MONOABS 0.7 09/23/2023 1012   EOSABS 0.1 09/23/2023 1012   BASOSABS 0.0 09/23/2023 1012    CMP     Component Value Date/Time   NA 139 09/23/2023 1012   K 3.6 09/23/2023 1012   CL 103 09/23/2023 1012   CO2 26 09/23/2023 1012   GLUCOSE 99 09/23/2023 1012   BUN 10 09/23/2023 1012   CREATININE 0.82 09/23/2023 1012   CALCIUM 9.0 09/23/2023 1012   PROT 7.8 09/23/2023 1012   ALBUMIN 4.4 09/23/2023 1012   AST 19 09/23/2023 1012   ALT 10 09/23/2023 1012   ALKPHOS 62 09/23/2023 1012   BILITOT 0.4 09/23/2023 1012   GFRNONAA >60 09/23/2023 1012   GFRAA >60 01/10/2019 2054     ASSESSMENT and THERAPY PLAN:   Assessment and Plan Assessment & Plan Iron  deficiency anemia Iron  deficiency anemia with pica linked to non-compliance with iron  supplementation. Labs show normal hemoglobin and MCV, indicating improvement. Awaiting ferritin levels to assess iron  stores. - Continue iron   325 mg daily. - Await ferritin level results and follow up with results. - Educated on the importance of daily iron  supplementation to prevent pica and severe anemia. - Schedule lab testing and a virtual visit in six months.  Heavy menstrual bleeding Heavy menstrual bleeding with increased flow--which is likely causing her iron  deficiency. Advised tracking menstrual cycles for detailed information to OB GYN. Discussed potential options to reduce bleeding, including IUD use. - Track menstrual cycle flow and frequency using a cycle tracker. - Follow up with OB GYN to discuss options for reducing menstrual bleeding, including  the possibility of an IUD.  RTC in 6 months for labs and f/u   All questions were answered. The patient knows to call the clinic with any problems, questions or concerns. We can certainly see the patient much sooner if necessary.  Total encounter time:30 minutes*in face-to-face visit time, chart review, lab review, care coordination, order entry, and documentation of the encounter time.    Morna Kendall, NP 09/23/23 11:07 AM Medical Oncology and Hematology [facility] [address] [place], [place] [address] Tel. [phone]    Fax. [phone]  *Total Encounter Time as defined by the Centers for Medicare and Medicaid Services includes, in addition to the face-to-face time of a patient visit (documented in the note above) non-face-to-face time: obtaining and reviewing outside history, ordering and reviewing medications, tests or procedures, care coordination (communications with other health care professionals or caregivers) and documentation in the medical record.

## 2023-10-10 ENCOUNTER — Encounter (HOSPITAL_COMMUNITY): Payer: Self-pay | Admitting: *Deleted

## 2023-10-10 ENCOUNTER — Ambulatory Visit (HOSPITAL_COMMUNITY)
Admission: EM | Admit: 2023-10-10 | Discharge: 2023-10-10 | Disposition: A | Discharge status: Home / Self Care | Attending: Physician Assistant | Admitting: Physician Assistant

## 2023-10-10 DIAGNOSIS — N898 Other specified noninflammatory disorders of vagina: Secondary | ICD-10-CM | POA: Diagnosis not present

## 2023-10-10 DIAGNOSIS — Z113 Encounter for screening for infections with a predominantly sexual mode of transmission: Secondary | ICD-10-CM | POA: Insufficient documentation

## 2023-10-10 DIAGNOSIS — B009 Herpesviral infection, unspecified: Secondary | ICD-10-CM | POA: Diagnosis not present

## 2023-10-10 LAB — POCT URINALYSIS DIP (MANUAL ENTRY)
Bilirubin, UA: NEGATIVE
Glucose, UA: NEGATIVE mg/dL
Ketones, POC UA: NEGATIVE mg/dL
Leukocytes, UA: NEGATIVE
Nitrite, UA: NEGATIVE
Protein Ur, POC: NEGATIVE mg/dL
Spec Grav, UA: 1.02 (ref 1.010–1.025)
Urobilinogen, UA: 0.2 U/dL
pH, UA: 7.5 (ref 5.0–8.0)

## 2023-10-10 LAB — POCT URINE PREGNANCY: Preg Test, Ur: NEGATIVE

## 2023-10-10 LAB — HIV ANTIBODY (ROUTINE TESTING W REFLEX): HIV Screen 4th Generation wRfx: REACTIVE — AB

## 2023-10-10 MED ORDER — VALACYCLOVIR HCL 500 MG PO TABS: 500.0000 mg | ORAL_TABLET | Freq: Two times a day (BID) | ORAL | 0 refills | Status: DC

## 2023-10-10 MED ORDER — METRONIDAZOLE 500 MG PO TABS: 500.0000 mg | ORAL_TABLET | Freq: Two times a day (BID) | ORAL | 0 refills | Status: DC

## 2023-10-10 NOTE — Discharge Instructions (Addendum)
 We are treating you for BV.  Take metronidazole  twice daily.  Do not drink any alcohol while taking this medication or for 3 days after you finish it as it will cause you to vomit.  I have also called in valacyclovir  to treat outbreak.  I will contact you if any of your other testing is abnormal and changes our treatment plan.  Use loosefitting cotton underwear and use hypoallergenic soaps and detergents.  Abstain from sex until you receive results.  If you develop any pelvic pain, abdominal pain, additional lesions, fever, nausea, vomiting you need to be seen immediately.

## 2023-10-10 NOTE — ED Triage Notes (Signed)
 Pt states she has some vaginal discharge worse with her menstrual cycle, 3 boils in the vaginal area, foul smell X 1 week. She would like STI check with cyto and blood work.

## 2023-10-10 NOTE — ED Provider Notes (Signed)
 [facility]    CSN: [id number] Arrival date & time: 10/10/23  9076      History   Chief Complaint Chief Complaint  Patient presents with   SEXUALLY TRANSMITTED DISEASE    HPI Crystal Walters is a 28 y.o. female.   Patient presents today with a 1 week history of vaginal discharge.  She describes this as copious and malodorous.  She denies any pelvic pain, abdominal pain, fever, nausea, vomiting.  She does have several lesions in the genital region which she attributes to HSV infection but reports that she does not typically have outbreaks and so would like us  to look to make sure that that is what is going on.  She denies any recent antibiotics or steroids.  She is sexually active with female partners.  She is requesting complete STI panel.  Denies any specific exposures.  She denies any changes to personal hygiene products including soaps or detergents.  She is confident that she is not pregnant.    Past Medical History:  Diagnosis Date   Anemia    Angio-edema    Chlamydia    Depression    Eczema    Seizures (HCC)    childhood   Vaginal Pap smear, abnormal     Patient Active Problem List   Diagnosis Date Noted   Well woman exam with routine gynecological exam 05/20/2023   Encounter for counseling regarding contraception 05/20/2023   IDA (iron  deficiency anemia) 03/02/2023   Genetic testing 12/16/2022   Indication for care in labor or delivery 10/21/2018    Past Surgical History:  Procedure Laterality Date   NO PAST SURGERIES      OB History     Gravida  2   Para  2   Term  2   Preterm      AB      Living  2      SAB      IAB      Ectopic      Multiple  0   Live Births  2            Home Medications    Prior to Admission medications   Medication Sig Start Date End Date Taking? Authorizing Provider  metroNIDAZOLE  (FLAGYL ) 500 MG tablet Take 1 tablet (500 mg total) by mouth 2 (two) times daily. 10/10/23  Yes Syd Newsome K,  PA-C  valACYclovir  (VALTREX ) 500 MG tablet Take 1 tablet (500 mg total) by mouth 2 (two) times daily. 10/10/23  Yes Neeta Storey K, PA-C  cyclobenzaprine (FLEXERIL) 10 MG tablet Take 10 mg by mouth at bedtime as needed. 06/10/23   [provider]  Iron , Ferrous Sulfate , 325 (65 Fe) MG TABS Take 325 mg by mouth daily. 12/31/22   Alvia Corean CROME, FNP  nicotine  polacrilex (NICORETTE ) 4 MG gum Take 1 each (4 mg total) by mouth as needed for smoking cessation. 04/13/23   Alvia Corean CROME, FNP  omeprazole  (PRILOSEC) 20 MG capsule Take 1 capsule (20 mg total) by mouth daily. 02/23/19 03/22/19  Tellis Laneta NOVAK, NP    Family History Family History  Problem Relation Age of Onset   Eczema Mother    Allergic rhinitis Mother    Hypertension Mother    Anemia Mother    Allergic rhinitis Father    Cervical cancer Paternal Grandmother    Thyroid  cancer Paternal Grandmother        medullary; dx >50   Breast cancer Paternal Aunt  Asthma Half-Sister    Cancer Half-Sister        ectodermal; dx 6 months; paternal half isster   Pancreatic cancer Other        PGF's brother (dx 45s) and PGF's mother (dx 27s)   Ovarian cancer Other        PGF's mother (d. 97)   Colon cancer Other        unkown maternal relative   Angioedema Neg Hx    Urticaria Neg Hx     Social History Social History   Tobacco Use   Smoking status: Every Day    Types: Cigars    Passive exposure: Never   Smokeless tobacco: Never   Tobacco comments:    Black & Milds  Vaping Use   Vaping status: Never Used  Substance Use Topics   Alcohol use: Yes    Comment: occasionally-2-3 per weekends   Drug use: Yes    Types: Marijuana    Comment: occasionally     Allergies   Marijuana (cannabis sativa)   Review of Systems Review of Systems  Constitutional:  Negative for activity change, appetite change, fatigue and fever.  Gastrointestinal:  Negative for abdominal pain, diarrhea, nausea and vomiting.   Genitourinary:  Positive for genital sores and vaginal discharge. Negative for dysuria, frequency, urgency, vaginal bleeding and vaginal pain.     Physical Exam Triage Vital Signs ED Triage Vitals  Encounter Vitals Group     BP 10/10/23 1034 (!) 131/95     Girls Systolic BP Percentile --      Girls Diastolic BP Percentile --      Boys Systolic BP Percentile --      Boys Diastolic BP Percentile --      Pulse Rate 10/10/23 1034 90     Resp 10/10/23 1034 16     Temp 10/10/23 1034 98.2 F (36.8 C)     Temp Source 10/10/23 1034 Oral     SpO2 10/10/23 1034 98 %     Weight --      Height --      Head Circumference --      Peak Flow --      Pain Score 10/10/23 1030 0     Pain Loc --      Pain Education --      Exclude from Growth Chart --    No data found.  Updated Vital Signs BP (!) 131/95 (BP Location: Right Arm)   Pulse 90   Temp 98.2 F (36.8 C) (Oral)   Resp 16   LMP 09/25/2023 (Exact Date)   SpO2 98%   Visual Acuity Right Eye Distance:   Left Eye Distance:   Bilateral Distance:    Right Eye Near:   Left Eye Near:    Bilateral Near:     Physical Exam Vitals reviewed. Exam conducted with a chaperone present.  Constitutional:      General: She is awake. She is not in acute distress.    Appearance: Normal appearance. She is well-developed. She is not ill-appearing.     Comments: Very pleasant female appears stated age in no acute distress sitting comfortably in exam room  HENT:     Head: Normocephalic and atraumatic.  Cardiovascular:     Rate and Rhythm: Normal rate and regular rhythm.     Heart sounds: Normal heart sounds, S1 normal and S2 normal. No murmur heard. Pulmonary:     Effort: Pulmonary effort is normal.     Breath  sounds: Normal breath sounds. No wheezing, rhonchi or rales.     Comments: Clear to auscultation bilaterally Abdominal:     General: Bowel sounds are normal.     Palpations: Abdomen is soft.     Tenderness: There is no abdominal  tenderness. There is no right CVA tenderness, left CVA tenderness, guarding or rebound.     Comments: Benign abdominal exam  Genitourinary:    Labia:        Right: Lesion present. No rash or tenderness.        Left: No rash, tenderness or lesion.      Comments: Superficial ulcerated lesions noted medial labia majora.  Thick discharge noted posterior vaginal vault.  No cervical motion tenderness or adnexal tenderness.  Bobbi, CMA present as chaperone during exam.  Psychiatric:        Behavior: Behavior is cooperative.      UC Treatments / Results  Labs (all labs ordered are listed, but only abnormal results are displayed) Labs Reviewed  POCT URINALYSIS DIP (MANUAL ENTRY) - Abnormal; Notable for the following components:      Result Value   Blood, UA trace-intact (*)    All other components within normal limits  RPR  HIV ANTIBODY (ROUTINE TESTING W REFLEX)  POCT URINE PREGNANCY  CERVICOVAGINAL ANCILLARY ONLY    EKG   Radiology No results found.  Procedures Procedures (including critical care time)  Medications Ordered in UC Medications - No data to display  Initial Impression / Assessment and Plan / UC Course  I have reviewed the triage vital signs and the nursing notes.  Pertinent labs & imaging results that were available during my care of the patient were reviewed by me and considered in my medical decision making (see chart for details).     Patient is well-appearing, afebrile, nontoxic, nontachycardic.  Vital signs and physical exam are reassuring.  UA was obtained that showed trace blood but was otherwise normal this appears to have been present for at least the last several months.  Urine pregnancy was negative.  No evidence of PID on pelvic exam.  She did have some ulcerated lesions that could be related to a healing HSV outbreak so we will start valacyclovir  500 mg twice daily for 3 days.  She is concerned about vaginal odor and recurrent BV and so we will also  start metronidazole .  Reports that she generally has to have 10 days of this medication given recurrent outbreaks and so this was sent per her request.  No indication for dose adjustment of either medication based on metabolic panel from 09/23/2023 with a creatinine of 0.82 and calculated creatinine clearance of 106 mL/min.  STI swab was collected during pelvic exam.  Additional testing for HIV and RPR were obtained; patient had negative hepatitis C antibody testing in April 2025.  We discussed that if she develops any pelvic pain, abdominal pain, fever, nausea, vomiting she needs to be seen emergently.  Should return precautions given.  All questions answered to patient satisfaction.  Final Clinical Impressions(s) / UC Diagnoses   Final diagnoses:  Vaginal discharge  Vaginal sore  HSV (herpes simplex virus) infection     Discharge Instructions      We are treating you for BV.  Take metronidazole  twice daily.  Do not drink any alcohol while taking this medication or for 3 days after you finish it as it will cause you to vomit.  I have also called in valacyclovir  to treat outbreak.  I  will contact you if any of your other testing is abnormal and changes our treatment plan.  Use loosefitting cotton underwear and use hypoallergenic soaps and detergents.  Abstain from sex until you receive results.  If you develop any pelvic pain, abdominal pain, additional lesions, fever, nausea, vomiting you need to be seen immediately.     ED Prescriptions     Medication Sig Dispense Auth. Provider   valACYclovir  (VALTREX ) 500 MG tablet Take 1 tablet (500 mg total) by mouth 2 (two) times daily. 6 tablet Frances Ambrosino K, PA-C   metroNIDAZOLE  (FLAGYL ) 500 MG tablet Take 1 tablet (500 mg total) by mouth 2 (two) times daily. 20 tablet Tehillah Cipriani K, PA-C      PDMP not reviewed this encounter.   Sherrell Rocky POUR, PA-C 10/10/23 1153

## 2023-10-11 ENCOUNTER — Telehealth: Payer: Self-pay

## 2023-10-11 ENCOUNTER — Telehealth (HOSPITAL_COMMUNITY): Payer: Self-pay

## 2023-10-11 LAB — CERVICOVAGINAL ANCILLARY ONLY
Bacterial Vaginitis (gardnerella): POSITIVE — AB
Candida Glabrata: NEGATIVE
Candida Vaginitis: NEGATIVE
Chlamydia: NEGATIVE
Comment: NEGATIVE
Comment: NEGATIVE
Comment: NEGATIVE
Comment: NEGATIVE
Comment: NEGATIVE
Comment: NORMAL
Neisseria Gonorrhea: NEGATIVE
Trichomonas: NEGATIVE

## 2023-10-11 LAB — RPR: RPR Ser Ql: NONREACTIVE

## 2023-10-11 NOTE — Telephone Encounter (Signed)
 Per review of EPIC, patient was contacted by Urgent Care RN today and results discussed.   Routing to Dr. Dallie.

## 2023-10-11 NOTE — Telephone Encounter (Signed)
 Patient called & stated she was seen in urgent care yesterday & had some testing done. She was calling to see if we could give her the results. I told her she would need to call the office back where she had testing done for them to give her the results. She said she called them but it is taking too long & no one has called her back yet. She said she will call them again. She states she has some questions regarding her results & still wants to know if someone can tell her anything regarding her test results. Routing to [facility] nursing supervisor & Dr Dallie. Patient is aware Dr Dallie is out of the office today.

## 2023-10-11 NOTE — Telephone Encounter (Signed)
 Patient says she was seen at urgent care & is calling for results.

## 2023-10-11 NOTE — Telephone Encounter (Signed)
 Returned pts call. Pt notified test results are in process. Pt notified she will receive a call if test results are positive and will be treated at that time. All questions answered.

## 2023-10-12 ENCOUNTER — Encounter: Payer: Self-pay | Admitting: Obstetrics and Gynecology

## 2023-10-12 NOTE — Telephone Encounter (Signed)
 See telephone encounter dated 10/11/23.   Encounter closed.

## 2023-10-12 NOTE — Telephone Encounter (Signed)
 Patient called triage and sent MyChart message. Call returned to patient.   Reactive HIV 10/10/23; HIV ab pending.   Patient was seen at [facility] on 10/10/23 for BV and treated.last HIV 05/20/23 NR. States new partner in April. Denies any symptoms or concerns. Would like guidance on next steps or recommendations. Has many questions, urgent care not helpful. Patient is currently living in [place]. PCP also in [place].   Advised I will review with Dr. Dallie and f/u, patient agreeable.   Dr. Dallie -please review -can patient be worked in for MyChart visit?

## 2023-10-12 NOTE — Telephone Encounter (Signed)
 Spoke with patient, MyChart visit scheduled for 0845 on 10/13/23.   Encounter closed.

## 2023-10-13 ENCOUNTER — Telehealth: Admitting: Obstetrics and Gynecology

## 2023-10-13 ENCOUNTER — Encounter: Payer: Self-pay | Admitting: Obstetrics and Gynecology

## 2023-10-13 ENCOUNTER — Ambulatory Visit (HOSPITAL_COMMUNITY): Payer: Self-pay

## 2023-10-13 VITALS — Wt 140.0 lb

## 2023-10-13 DIAGNOSIS — Z21 Asymptomatic human immunodeficiency virus [HIV] infection status: Secondary | ICD-10-CM | POA: Diagnosis not present

## 2023-10-13 NOTE — Progress Notes (Addendum)
 28 y.o. G80P2002 female with IDA, hx of HPV here for results discussion. Single. For this video visit, the patient is in her car and I am in my office at the [facility].   I connected with  Crystal Walters on 10/13/23 by a video enabled telemedicine application and verified that I am speaking with the correct person using two identifiers.   I discussed the limitations of evaluation and management by telemedicine. The patient expressed understanding and agreed to proceed.   Start time:  8:48AM End time:  9:02AM  Patient's last menstrual period was 09/25/2023 (exact date).   She reports being evaluated at Urgent Care for discharge and was diagnosed with BV. Her HIV screening results is positive.  Same partner for 7 months, stopped using condoms ~5 months ago. Non-monogamous. Unknown HIV status of partner. Partner has since went for testing. She is asymptomatic but worried. No fever or weight loss. HIV neg 05/20/23  10/10/23 HIV AB pos, confirmation sent 10/10/23 RPR neg 10/10/23 nuswab +BV, treated  OB History  Gravida Para Term Preterm AB Living  2 2 2   2   SAB IAB Ectopic Multiple Live Births     0 2    # Outcome Date GA Lbr Len/2nd Weight Sex Type Anes PTL Lv  2 Term 10/21/18 [redacted]w[redacted]d 05:58 / 00:11 8 lb 5 oz (3.771 kg) M Vag-Spont EPI  LIV  1 Term 10/08/14 [redacted]w[redacted]d 03:38 / 00:14 7 lb 5.5 oz (3.33 kg) M Vag-Spont EPI  LIV   Past Medical History:  Diagnosis Date   Anemia    Angio-edema    Chlamydia    Depression    Eczema    Seizures (HCC)    childhood   Vaginal Pap smear, abnormal    Past Surgical History:  Procedure Laterality Date   NO PAST SURGERIES     Current Outpatient Medications on File Prior to Visit  Medication Sig Dispense Refill   cyclobenzaprine (FLEXERIL) 10 MG tablet Take 10 mg by mouth at bedtime as needed.     Iron , Ferrous Sulfate , 325 (65 Fe) MG TABS Take 325 mg by mouth daily. 30 tablet 1   metroNIDAZOLE  (FLAGYL ) 500 MG tablet Take 1  tablet (500 mg total) by mouth 2 (two) times daily. 20 tablet 0   nicotine  polacrilex (NICORETTE ) 4 MG gum Take 1 each (4 mg total) by mouth as needed for smoking cessation. 100 tablet 0   valACYclovir  (VALTREX ) 500 MG tablet Take 1 tablet (500 mg total) by mouth 2 (two) times daily. 6 tablet 0   [DISCONTINUED] omeprazole  (PRILOSEC) 20 MG capsule Take 1 capsule (20 mg total) by mouth daily. 30 capsule 0   No current facility-administered medications on file prior to visit.   Allergies  Allergen Reactions   Marijuana (Cannabis Sativa)     No allergy testing but was told by the ER that she may be allergic     PE Today's Vitals   10/13/23 0839  Weight: 140 lb (63.5 kg)   Body mass index is 22.46 kg/m.  Physical Exam Vitals reviewed.  Constitutional:      General: She is not in acute distress.    Appearance: Normal appearance.  HENT:     Head: Normocephalic and atraumatic.     Nose: Nose normal.  Eyes:     Extraocular Movements: Extraocular movements intact.     Conjunctiva/sclera: Conjunctivae normal.  Pulmonary:     Effort: Pulmonary effort is normal.  Musculoskeletal:  Cervical back: Normal range of motion.  Neurological:     General: No focal deficit present.     Mental Status: She is alert.  Psychiatric:        Mood and Affect: Mood normal.        Behavior: Behavior normal.      Assessment and Plan:        HIV antibody positive (HCC)  Positive screening test, awaiting confirmatory testing. Reviewed importance of confirmatory testing, which is important for diagnosis. Will follow-up test results and  discuss further recommendations once we have further information  [name], MD

## 2023-10-14 ENCOUNTER — Telehealth: Payer: Self-pay

## 2023-10-14 LAB — HIV-1/HIV-2 QUALITATIVE RNA
Final Interpretation: NEGATIVE
HIV-1 RNA, Qualitative: NONREACTIVE
HIV-2 RNA, Qualitative: NONREACTIVE

## 2023-10-14 LAB — HIV-1/2 AB - DIFFERENTIATION
HIV 1 Ab: NONREACTIVE
HIV 2 Ab: NONREACTIVE
Note: NEGATIVE

## 2023-10-14 NOTE — Telephone Encounter (Signed)
 Patient called & would like for Dr Dallie to call her back. She had a couple more questions. She states her partner had testing & it was negative. She is aware she is still waiting on the secondary testing of the HIV. Routing to Dr Dallie

## 2023-10-15 ENCOUNTER — Telehealth (HOSPITAL_COMMUNITY): Payer: Self-pay

## 2023-10-15 MED ORDER — FLUCONAZOLE 150 MG PO TABS: 150.0000 mg | ORAL_TABLET | Freq: Every day | ORAL | 0 refills | Status: DC

## 2023-10-15 NOTE — Telephone Encounter (Signed)
 Patient is requesting medication for yeast due to her taking Flagyl  for yeast infection. Patient is requesting diflucan  be called into [facility]. Per Harlene MICAEL [place] to send in.

## 2023-10-17 NOTE — Telephone Encounter (Signed)
 Called patient 10/16/23 to discuss final results Confirmatory testing was negative She also reports that her partner's test was negative Discussed risk of early positive vs false positive Recommend repeat testing in 6wk to confirm All questions answered

## 2024-01-10 ENCOUNTER — Telehealth: Payer: Self-pay | Admitting: *Deleted

## 2024-01-10 NOTE — Telephone Encounter (Signed)
 Spoke with patient. Patient is currently living in [place]. Requesting virtual visit or Rx for yeast.   Reports externall vaginal itching, white vaginal discharge and swollen vaginal opening. Has tried OTC monistat 1 day with no relief. Is applying yogurt for relief, has improved some. Denies vaginal odor, bleeding or urinary symptoms. Pharmacy updated. Advised will review request with Dr. Dallie and our office will f/u. Patient verbalizes understanding and is agreeable.   Last AEX 05/20/23  Dr. Dallie -please review and advise.

## 2024-01-11 NOTE — Telephone Encounter (Signed)
 Spoke with patient, advised per Dr. Dallie.   Patient states she will transfer care since she has relocated. Declines OV. Instructions provided if assistance is needed with records. Patient verbalizes understanding.   Encounter closed.

## 2024-02-13 ENCOUNTER — Ambulatory Visit: Admitting: Family Medicine

## 2024-02-13 NOTE — Progress Notes (Unsigned)
" ° °  Acute Office Visit  Subjective:     Patient ID: Crystal Walters, female    DOB: 1995-02-21, 29 y.o.   MRN: [id number]  No chief complaint on file.   HPI  Discussed the use of AI scribe software for clinical note transcription with the patient, who gave verbal consent to proceed.  History of Present Illness      ROS Per HPI      Objective:    There were no vitals taken for this visit.   Physical Exam Vitals and nursing note reviewed.  Constitutional:      General: She is not in acute distress.    Appearance: Normal appearance. She is normal weight.  HENT:     Head: Normocephalic and atraumatic.     Right Ear: External ear normal.     Left Ear: External ear normal.     Nose: Nose normal.     Mouth/Throat:     Mouth: Mucous membranes are moist.     Pharynx: Oropharynx is clear.  Eyes:     Extraocular Movements: Extraocular movements intact.     Pupils: Pupils are equal, round, and reactive to light.  Cardiovascular:     Rate and Rhythm: Normal rate and regular rhythm.     Pulses: Normal pulses.     Heart sounds: Normal heart sounds.  Pulmonary:     Effort: Pulmonary effort is normal. No respiratory distress.     Breath sounds: Normal breath sounds. No wheezing, rhonchi or rales.  Musculoskeletal:        General: Normal range of motion.     Cervical back: Normal range of motion.     Right lower leg: No edema.     Left lower leg: No edema.  Lymphadenopathy:     Cervical: No cervical adenopathy.  Neurological:     General: No focal deficit present.     Mental Status: She is alert and oriented to person, place, and time.  Psychiatric:        Mood and Affect: Mood normal.        Thought Content: Thought content normal.     No results found for any visits on 02/13/24.      Assessment & Plan:   Assessment and Plan Assessment & Plan      No orders of the defined types were placed in this encounter.    No orders of the defined types were placed  in this encounter.   No follow-ups on file.  Corean LITTIE Ku, FNP  "

## 2024-02-14 ENCOUNTER — Ambulatory Visit (INDEPENDENT_AMBULATORY_CARE_PROVIDER_SITE_OTHER): Admitting: Family Medicine

## 2024-02-14 VITALS — BP 124/84 | HR 74 | Temp 98.4°F | Ht 66.2 in | Wt 151.8 lb

## 2024-02-14 DIAGNOSIS — F411 Generalized anxiety disorder: Secondary | ICD-10-CM | POA: Diagnosis not present

## 2024-02-14 DIAGNOSIS — B009 Herpesviral infection, unspecified: Secondary | ICD-10-CM

## 2024-02-14 DIAGNOSIS — D509 Iron deficiency anemia, unspecified: Secondary | ICD-10-CM

## 2024-02-14 MED ORDER — VALACYCLOVIR HCL 500 MG PO TABS: 500.0000 mg | ORAL_TABLET | Freq: Every day | ORAL | 1 refills | Status: AC

## 2024-02-14 NOTE — Patient Instructions (Addendum)
 Get us  your FMLA paperwork and we can send it in for HSV outbreaks.   Take Valtrex  500mg  once daily as preventative therapy.   I have sent a referral to psychiatry for you in [place] today. Please follow up there for further evaluation and management.   Follow-up with me for new or worsening symptoms.

## 2024-02-14 NOTE — Progress Notes (Signed)
 0  Acute Office Visit  Subjective:     Patient ID: Crystal Walters, female    DOB: Dec 25, 1995, 29 y.o.   MRN: [id number]  No chief complaint on file.   HPI  Discussed the use of AI scribe software for clinical note transcription with the patient, who gave verbal consent to proceed.  History of Present Illness Crystal Walters is a 29 year old female who presents with stress-related symptoms and FMLA qualification.  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 [employer] - Frequent crying episodes - Urinary frequency, especially when approaching work - Missed approximately 1.5 weeks of work due to symptoms - Received disciplinary action at work for absences - Previously used marijuana for anxiety but has discontinued use  Sleep disturbance and fatigue - Work schedule is 4 PM to 1 AM, combined with school and parenting responsibilities - Sleep pattern consists of 3 AM to 6:30 AM and 10 AM to 1 PM - Chronic exhaustion - Post-nap headaches  Genital herpes outbreaks - History of genital herpes with increased outbreak frequency since starting a new relationship - Outbreaks often occur around menses - Nonadherence to prescribed Valtrex  500 mg regimen - Believes current stress is contributing to increased outbreak frequency  Urinary symptoms - Urinary frequency, particularly in association with work-related stress  Headache - Headaches occur after naps, associated with poor sleep quality  Anemia - Diagnosed with anemia in April of previous year - Recent laboratory results show no evidence of anemia - Family history of anemia on paternal side     ROS Per HPI      Objective:    BP 124/84 (BP Location: Left Arm, Patient Position: Sitting)   Pulse 74   Temp 98.4 F (36.9 C) (Temporal)   Ht 5' 6.2 (1.681 m)   Wt 151 lb 12.8 oz (68.9 kg)   SpO2 95%   BMI 24.35 kg/m    Physical Exam Vitals and nursing  note reviewed.  Constitutional:      General: She is not in acute distress.    Appearance: Normal appearance. She is normal weight.  HENT:     Head: Normocephalic and atraumatic.     Right Ear: External ear normal.     Left Ear: External ear normal.     Nose: Nose normal.     Mouth/Throat:     Mouth: Mucous membranes are moist.     Pharynx: Oropharynx is clear.  Eyes:     Extraocular Movements: Extraocular movements intact.     Pupils: Pupils are equal, round, and reactive to light.  Cardiovascular:     Rate and Rhythm: Normal rate and regular rhythm.     Pulses: Normal pulses.  Pulmonary:     Effort: Pulmonary effort is normal.  Musculoskeletal:        General: Normal range of motion.     Cervical back: Normal range of motion.     Right lower leg: No edema.     Left lower leg: No edema.  Lymphadenopathy:     Cervical: No cervical adenopathy.  Neurological:     General: No focal deficit present.     Mental Status: She is alert and oriented to person, place, and time.  Psychiatric:        Mood and Affect: Mood normal.        Thought Content: Thought content normal.     No results found for any visits on 02/14/24.  Assessment & Plan:   Assessment and Plan Assessment & Plan Genital herpes simplex virus infection Recent outbreak causing discomfort, stress identified as trigger, no antiviral prophylaxis. - Prescribed Valtrex  500 mg daily for prophylaxis. - [facility] - Discussed FMLA paperwork for work-related accommodations.  Generalized anxiety disorder Significant stress from work and personal life, no current treatment, reluctance towards therapy. - Referred to [facility] for evaluation and treatment. - Discussed FMLA paperwork for work-related accommodations.  Iron  deficiency anemia, unsp Well-managed with normal iron  panel and ferritin levels, no symptoms. - Continue current management and monitoring of iron   levels. - Follow up with hematology in February.     Orders Placed This Encounter  Procedures   Ambulatory referral to Psychology    Referral Priority:   Routine    Referral Type:   Psychiatric    Referral Reason:   Specialty Services Required    Referral Location:   [facility]    Requested Specialty:   Psychiatry    Number of Visits Requested:   1     Meds ordered this encounter  Medications   valACYclovir  (VALTREX ) 500 MG tablet    Sig: Take 1 tablet (500 mg total) by mouth daily.    Dispense:  90 tablet    Refill:  1    Return if symptoms worsen or fail to improve.  Corean LITTIE Ku, FNP

## 2024-02-15 ENCOUNTER — Telehealth: Payer: Self-pay

## 2024-02-15 NOTE — Telephone Encounter (Signed)
 Copied from CRM #[id number]. Topic: General - Other >> Feb 15, 2024  8:48 AM Berneda FALCON wrote: Reason for CRM: Patient is calling in about her FMLA paperwork. She states she has the documents for FMLA and wants to know if she can upload it to the MyChart portal or email it to us .  I did let her know there is an option in the portal message to upload an attachment there but she would still like to speak to the nurse.  Patient is requesting a callback from the nurse, please.  Patient callback number is [phone] (home)

## 2024-02-16 NOTE — Telephone Encounter (Signed)
 LVM for patient

## 2024-02-16 NOTE — Telephone Encounter (Unsigned)
 Copied from CRM #[id number]. Topic: General - Other >> Feb 16, 2024 11:29 AM Alfonso HERO wrote: Reason for CRM: patient calling about the FMLA forms. She also stated that she has additional documents from them that needs to be completed that she is going to fax over.

## 2024-02-28 ENCOUNTER — Telehealth: Payer: Self-pay

## 2024-02-28 NOTE — Telephone Encounter (Signed)
 Copied from CRM [id number]. Topic: Clinical - Medical Advice >> Feb 28, 2024  3:33 PM Rea ORN wrote: Reason for CRM: called to advise she recently found out she is [redacted] weeks pregnant. She stated she is anemic and is wondering if she should take more iron  that what she is already prescribed.   Please call back to advise,  [phone]

## 2024-03-01 NOTE — Telephone Encounter (Signed)
 Spoke with patient, she is seeing a GYN in March

## 2024-03-23 ENCOUNTER — Other Ambulatory Visit

## 2024-03-26 ENCOUNTER — Telehealth: Admitting: Adult Health
